# Patient Record
Sex: Female | Born: 1945
Health system: Southern US, Community
[De-identification: ages and names within clinical notes are randomized; demographics above are authoritative.]

## PROBLEM LIST (undated history)

## (undated) DIAGNOSIS — E785 Hyperlipidemia, unspecified: Secondary | ICD-10-CM

## (undated) DIAGNOSIS — I442 Atrioventricular block, complete: Secondary | ICD-10-CM

## (undated) DIAGNOSIS — Z9889 Other specified postprocedural states: Secondary | ICD-10-CM

## (undated) DIAGNOSIS — M1990 Unspecified osteoarthritis, unspecified site: Secondary | ICD-10-CM

## (undated) DIAGNOSIS — C50919 Malignant neoplasm of unspecified site of unspecified female breast: Secondary | ICD-10-CM

## (undated) DIAGNOSIS — Z973 Presence of spectacles and contact lenses: Secondary | ICD-10-CM

## (undated) DIAGNOSIS — F419 Anxiety disorder, unspecified: Secondary | ICD-10-CM

## (undated) DIAGNOSIS — T7840XA Allergy, unspecified, initial encounter: Secondary | ICD-10-CM

## (undated) DIAGNOSIS — C539 Malignant neoplasm of cervix uteri, unspecified: Secondary | ICD-10-CM

## (undated) DIAGNOSIS — R112 Nausea with vomiting, unspecified: Secondary | ICD-10-CM

## (undated) DIAGNOSIS — Z974 Presence of external hearing-aid: Secondary | ICD-10-CM

## (undated) DIAGNOSIS — M549 Dorsalgia, unspecified: Secondary | ICD-10-CM

## (undated) DIAGNOSIS — Z923 Personal history of irradiation: Secondary | ICD-10-CM

## (undated) HISTORY — PX: ABDOMINAL HYSTERECTOMY: SHX81

## (undated) HISTORY — DX: Hyperlipidemia, unspecified: E78.5

## (undated) HISTORY — DX: Allergy, unspecified, initial encounter: T78.40XA

## (undated) HISTORY — DX: Anxiety disorder, unspecified: F41.9

---

## 1980-04-27 HISTORY — PX: CHOLECYSTECTOMY: SHX55

## 1980-04-27 HISTORY — PX: APPENDECTOMY: SHX54

## 2003-04-28 DIAGNOSIS — C50919 Malignant neoplasm of unspecified site of unspecified female breast: Secondary | ICD-10-CM

## 2003-04-28 HISTORY — PX: CARDIAC PACEMAKER PLACEMENT: SHX583

## 2003-04-28 HISTORY — DX: Malignant neoplasm of unspecified site of unspecified female breast: C50.919

## 2003-04-28 HISTORY — PX: BREAST LUMPECTOMY: SHX2

## 2003-11-09 ENCOUNTER — Other Ambulatory Visit: Payer: Self-pay

## 2003-11-10 ENCOUNTER — Other Ambulatory Visit: Payer: Self-pay

## 2010-04-27 HISTORY — PX: HIP FRACTURE SURGERY: SHX118

## 2011-11-17 DIAGNOSIS — S72019A Unspecified intracapsular fracture of unspecified femur, initial encounter for closed fracture: Secondary | ICD-10-CM

## 2011-11-17 HISTORY — DX: Unspecified intracapsular fracture of unspecified femur, initial encounter for closed fracture: S72.019A

## 2013-06-05 DIAGNOSIS — Z950 Presence of cardiac pacemaker: Secondary | ICD-10-CM

## 2013-06-05 HISTORY — DX: Presence of cardiac pacemaker: Z95.0

## 2014-01-30 DIAGNOSIS — R002 Palpitations: Secondary | ICD-10-CM | POA: Insufficient documentation

## 2014-01-31 DIAGNOSIS — K219 Gastro-esophageal reflux disease without esophagitis: Secondary | ICD-10-CM | POA: Insufficient documentation

## 2014-05-29 ENCOUNTER — Ambulatory Visit: Payer: Self-pay | Admitting: Cardiology

## 2014-05-29 LAB — BASIC METABOLIC PANEL
Anion Gap: 6 — ABNORMAL LOW (ref 7–16)
BUN: 13 mg/dL (ref 7–18)
CALCIUM: 9.1 mg/dL (ref 8.5–10.1)
CHLORIDE: 106 mmol/L (ref 98–107)
Co2: 28 mmol/L (ref 21–32)
Creatinine: 0.96 mg/dL (ref 0.60–1.30)
EGFR (Non-African Amer.): >60
Glucose: 97 mg/dL (ref 65–99)
Osmolality: 279 (ref 275–301)
Potassium: 4.2 mmol/L (ref 3.5–5.1)
Sodium: 140 mmol/L (ref 136–145)

## 2014-05-29 LAB — CBC WITH DIFFERENTIAL/PLATELET
BASOS ABS: 0 10*3/uL (ref 0.0–0.1)
BASOS PCT: 0.6 %
Eosinophil #: 0.2 10*3/uL (ref 0.0–0.7)
Eosinophil %: 3.4 %
HCT: 41.5 % (ref 35.0–47.0)
HGB: 13.7 g/dL (ref 12.0–16.0)
Lymphocyte #: 1.9 10*3/uL (ref 1.0–3.6)
Lymphocyte %: 27.3 %
MCH: 30.7 pg (ref 26.0–34.0)
MCHC: 33 g/dL (ref 32.0–36.0)
MCV: 93 fL (ref 80–100)
MONOS PCT: 9.7 %
Monocyte #: 0.7 x10 3/mm (ref 0.2–0.9)
NEUTROS ABS: 4 10*3/uL (ref 1.4–6.5)
Neutrophil %: 59 %
Platelet: 284 10*3/uL (ref 150–440)
RBC: 4.46 10*6/uL (ref 3.80–5.20)
RDW: 13.2 % (ref 11.5–14.5)
WBC: 6.8 10*3/uL (ref 3.6–11.0)

## 2014-05-29 LAB — PROTIME-INR
INR: 0.8
Prothrombin Time: 11.7 secs

## 2014-05-29 LAB — APTT: Activated PTT: 24.4 secs (ref 23.6–35.9)

## 2014-06-05 ENCOUNTER — Ambulatory Visit: Payer: Self-pay | Admitting: Cardiology

## 2014-07-24 ENCOUNTER — Ambulatory Visit: Payer: Self-pay | Admitting: Family Medicine

## 2014-08-26 NOTE — Op Note (Signed)
PATIENT NAME:  Natalie Rosales, Natalie Rosales MR#:  [id number] DATE OF BIRTH:  March 11, 1946  DATE OF PROCEDURE:  06/05/2014  PRIMARY CARE PHYSICIAN: Dr. Lavone Neri.  PREPROCEDURE DIAGNOSIS: Complete heart block, elective replacement indication.   PROCEDURE: Dual chamber pacemaker generator change-out.   POSTPROCEDURE DIAGNOSIS: Atrial sensing with ventricular pacing.   INDICATION: The patient is a 69 year old female status post dual-chamber pacemaker for complete heart block. Recent pacemaker interrogation revealed elective replacement indication.   DESCRIPTION OF PROCEDURE: The risks, benefits and alternatives of dual-chamber pacemaker generator change-out were explained to the patient and informed written consent was obtained.   She was brought to the operating room in a fasting state. The left pectoral region was prepped and draped in the usual sterile manner. Anesthesia was obtained with 1% Xylocaine locally. A 6 cm incision was performed over the left pectoral region. The old pacemaker generator was retrieved by electrocautery and blunt dissection. The atrial and ventricular leads were disconnected from the old pacemaker generator and connected to a new dual-chamber rate responsive pacemaker generator (Medtronic Adapta [id number]). The pacemaker pocket was irrigated with gentamicin solution. The pacemaker generator was positioned into the pocket. The pocket was closed with 2-0 and 4-0 Vicryl, respectively. Steri-Strips and pressure dressing were applied.    ____________________________ Isaias Cowman, MD ap:JT D: 06/05/2014 13:03:00 ET T: 06/05/2014 13:41:51 ET JOB#: [id number]  cc: Isaias Cowman, MD, <Dictator> Isaias Cowman MD ELECTRONICALLY SIGNED 06/19/2014 14:36

## 2014-09-20 DIAGNOSIS — Z8781 Personal history of (healed) traumatic fracture: Secondary | ICD-10-CM | POA: Insufficient documentation

## 2014-09-20 DIAGNOSIS — F419 Anxiety disorder, unspecified: Secondary | ICD-10-CM | POA: Insufficient documentation

## 2014-09-20 DIAGNOSIS — J302 Other seasonal allergic rhinitis: Secondary | ICD-10-CM | POA: Insufficient documentation

## 2014-09-20 DIAGNOSIS — Z853 Personal history of malignant neoplasm of breast: Secondary | ICD-10-CM | POA: Insufficient documentation

## 2014-09-20 DIAGNOSIS — Z6823 Body mass index (BMI) 23.0-23.9, adult: Secondary | ICD-10-CM | POA: Insufficient documentation

## 2014-11-26 ENCOUNTER — Ambulatory Visit
Admission: RE | Admit: 2014-11-26 | Discharge: 2014-11-26 | Disposition: A | Discharge status: Home / Self Care | Payer: PPO | Source: Ambulatory Visit | Attending: Family Medicine | Admitting: Family Medicine

## 2014-11-26 ENCOUNTER — Ambulatory Visit (INDEPENDENT_AMBULATORY_CARE_PROVIDER_SITE_OTHER): Payer: PPO | Admitting: Family Medicine

## 2014-11-26 ENCOUNTER — Encounter: Payer: Self-pay | Admitting: Family Medicine

## 2014-11-26 ENCOUNTER — Telehealth: Payer: Self-pay

## 2014-11-26 VITALS — BP 116/72 | HR 68 | Temp 98.2°F | Resp 16 | Ht 63.0 in | Wt 129.0 lb

## 2014-11-26 DIAGNOSIS — M8580 Other specified disorders of bone density and structure, unspecified site: Secondary | ICD-10-CM

## 2014-11-26 DIAGNOSIS — F419 Anxiety disorder, unspecified: Secondary | ICD-10-CM

## 2014-11-26 DIAGNOSIS — E782 Mixed hyperlipidemia: Secondary | ICD-10-CM

## 2014-11-26 DIAGNOSIS — C50919 Malignant neoplasm of unspecified site of unspecified female breast: Secondary | ICD-10-CM | POA: Insufficient documentation

## 2014-11-26 DIAGNOSIS — M545 Low back pain, unspecified: Secondary | ICD-10-CM

## 2014-11-26 DIAGNOSIS — I442 Atrioventricular block, complete: Secondary | ICD-10-CM | POA: Insufficient documentation

## 2014-11-26 DIAGNOSIS — Z0000 Encounter for general adult medical examination without abnormal findings: Secondary | ICD-10-CM | POA: Diagnosis not present

## 2014-11-26 DIAGNOSIS — Z23 Encounter for immunization: Secondary | ICD-10-CM | POA: Diagnosis not present

## 2014-11-26 DIAGNOSIS — K219 Gastro-esophageal reflux disease without esophagitis: Secondary | ICD-10-CM

## 2014-11-26 DIAGNOSIS — Z950 Presence of cardiac pacemaker: Secondary | ICD-10-CM | POA: Insufficient documentation

## 2014-11-26 DIAGNOSIS — M810 Age-related osteoporosis without current pathological fracture: Secondary | ICD-10-CM | POA: Insufficient documentation

## 2014-11-26 DIAGNOSIS — M1990 Unspecified osteoarthritis, unspecified site: Secondary | ICD-10-CM | POA: Insufficient documentation

## 2014-11-26 MED ORDER — ALPRAZOLAM 0.25 MG PO TABS: 0.2500 mg | ORAL_TABLET | Freq: Every evening | ORAL | Status: DC | PRN

## 2014-11-26 NOTE — Telephone Encounter (Signed)
-----   Message from Margarita Rana, MD sent at 11/26/2014  1:50 PM EDT ----- Odette Horns shows arthritis, otherwise ok. Please notify patient.  Thanks.

## 2014-11-26 NOTE — Telephone Encounter (Signed)
LMTCB 11/26/14  Thanks,  -Nithila Sumners

## 2014-11-26 NOTE — Progress Notes (Signed)
Patient ID: Natalie Rosales, female   DOB: Jun 18, 1945, 69 y.o.   MRN: [id number]        Patient: Natalie Rosales, Female    DOB: May 19, 1945, 69 y.o.   MRN: [id number] Visit Date: 11/26/2014  Today's Provider: Margarita Rana, MD   Chief Complaint  Patient presents with  . Annual Exam   Subjective:    Annual wellness visit Natalie Rosales is a 69 y.o. female who presents today for her Subsequent Annual Wellness Visit. She feels fairly well. She reports exercising regularly. She reports she is sleeping fairly well, but does have some sleepless nights.   Back Pain This is a chronic problem. The current episode started more than 1 month ago. The problem occurs every several days. The problem has been gradually worsening since onset. The pain is present in the lumbar spine. The quality of the pain is described as aching. The pain does not radiate. Pertinent negatives include no abdominal pain, bladder incontinence, bowel incontinence, dysuria, fever, tingling or weakness.    -----------------------------------------------------------   Review of Systems  Constitutional: Negative.  Negative for fever.  HENT: Negative.   Eyes: Negative.   Respiratory: Negative.   Cardiovascular: Negative.   Gastrointestinal: Negative.  Negative for abdominal pain and bowel incontinence.  Endocrine: Negative.   Genitourinary: Negative.  Negative for bladder incontinence and dysuria.  Musculoskeletal: Positive for back pain and arthralgias.  Skin: Negative.   Allergic/Immunologic: Negative.   Neurological: Negative.  Negative for tingling and weakness.  Hematological: Negative.   Psychiatric/Behavioral: Negative.     History   Social History  . Marital Status: Married    Spouse Name: [name]  . Number of Children: 3  . Years of Education: College   Occupational History  . 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   Social History Main Topics  . Smoking status: Former Research scientist (life sciences)  . Smokeless tobacco:  Never Used  . Alcohol Use: Yes     Comment: Has a mixed drink about four times a week.  . Drug Use: No  . Sexual Activity: Not on file   Other Topics Concern  . Not on file   Social History Narrative    Patient Active Problem List   Diagnosis Date Noted  . Breast CA 11/26/2014  . Acquired complete AV block 11/26/2014  . Combined fat and carbohydrate induced hyperlipemia 11/26/2014  . Arthritis, degenerative 11/26/2014  . Osteoporosis, post-menopausal 11/26/2014  . Artificial cardiac pacemaker 11/26/2014  . Anxiety 09/20/2014  . Body mass index (BMI) of 23.0-23.9 in adult 09/20/2014  . H/O fracture of hip 09/20/2014  . H/O malignant neoplasm of breast 09/20/2014  . Allergic rhinitis, seasonal 09/20/2014  . Gastro-esophageal reflux disease without esophagitis 01/31/2014  . Awareness of heartbeats 01/30/2014  . Closed fracture of intracapsular section of femur 11/17/2011    Past Surgical History  Procedure Laterality Date  . Cardiac pacemaker placement  2005  . Appendectomy  1982  . Cholecystectomy  1982  . Abdominal hysterectomy    . Breast lumpectomy Left   . Hip fracture surgery Right     Three pins     Her family history includes Bladder Cancer in her brother; Breast cancer in her sister; Cancer in her father and mother; Heart disease in her sister; Lymphoma in her brother and father; Pancreatic cancer in her father.    Previous Medications   CELECOXIB (CELEBREX) 200 MG CAPSULE    Take by mouth.   OMEPRAZOLE (PRILOSEC)  20 MG CAPSULE    TAKE 1 CAPSULE (20 MG TOTAL) BY MOUTH ONCE DAILY.    Patient Care Team: Margarita Rana, MD as PCP - General (Family Medicine)     Objective:   Vitals: BP 116/72 mmHg  Pulse 68  Temp(Src) 98.2 F (36.8 C) (Oral)  Resp 16  Wt 129 lb (58.514 kg)  Physical Exam  Constitutional: She is oriented to person, place, and time. She appears well-developed and well-nourished.  HENT:  Head: Normocephalic and atraumatic.  Right Ear:  Hearing, tympanic membrane, external ear and ear canal normal.  Left Ear: Hearing, tympanic membrane, external ear and ear canal normal.  Nose: Nose normal.  Mouth/Throat: Uvula is midline, oropharynx is clear and moist and mucous membranes are normal.  Eyes: Conjunctivae and lids are normal. Pupils are equal, round, and reactive to light. Lids are everted and swept, no foreign bodies found.  Neck: Trachea normal, normal range of motion and full passive range of motion without pain. Neck supple. Carotid bruit is not present.  Cardiovascular: Normal rate, regular rhythm, normal heart sounds and normal pulses.   Pulmonary/Chest: Effort normal and breath sounds normal. Right breast exhibits no inverted nipple, no mass, no nipple discharge, no skin change and no tenderness. Left breast exhibits no inverted nipple, no mass, no nipple discharge, no skin change and no tenderness. Breasts are symmetrical.  Well healed scar noted. Pacemaker palpable with healing scar noted.    Abdominal: Normal appearance and bowel sounds are normal.  Musculoskeletal: Normal range of motion.  Neurological: She is alert and oriented to person, place, and time.  Skin: Skin is warm, dry and intact.  Psychiatric: She has a normal mood and affect. Judgment normal.    Activities of Daily Living In your present state of health, do you have any difficulty performing the following activities: 11/26/2014  Hearing? N  Vision? N  Difficulty concentrating or making decisions? N  Walking or climbing stairs? N  Dressing or bathing? N  Doing errands, shopping? N    Fall Risk Assessment Fall Risk  11/26/2014  Falls in the past year? No     Depression Screen PHQ 2/9 Scores 11/26/2014  PHQ - 2 Score 0    Cognitive Testing - 6-CIT  Correct? Score   What year is it? yes 0 0 or 4  What month is it? yes 0 0 or 3  Memorize:    Pia Mau,  42,  [address]  [place],      What time is it? (within 1 hour) yes 0 0 or 3  Count  backwards from 20 yes 0 0, 2, or 4  Name the months of the year yes 0 0, 2, or 4  Repeat name & address above no 3 0, 2, 4, 6, 8, or 10       TOTAL SCORE  3/28   Interpretation:  Normal  Normal (0-7) Abnormal (8-28)       Assessment & Plan:     Annual Wellness Visit  Reviewed patient's Family Medical History Reviewed and updated list of patient's medical providers Assessment of cognitive impairment was done Assessed patient's functional ability Established a written schedule for health screening [place] Completed and Reviewed  Exercise Activities and Dietary recommendations Goals    None      Immunization History  Administered Date(s) Administered  . Pneumococcal Conjugate-13 11/26/2014    Health Maintenance  Topic Date Due  . TETANUS/TDAP  05/13/1964  . MAMMOGRAM  05/14/1995  . COLONOSCOPY  05/14/1995  . ZOSTAVAX  05/13/2005  . DEXA SCAN  05/13/2010  . INFLUENZA VACCINE  11/26/2014  . PNA vac Low Risk Adult (2 of 2 - PPSV23) 11/26/2015      Discussed health benefits of physical activity, and encouraged her to engage in regular exercise appropriate for her age and condition.     2. Osteopenia Schedule bone density.  - DG Bone Density  3. Midline low back pain without sciatica Will check Xray.  - DG Lumbar Spine 2-3 Views  4. Need for vaccination with 13-polyvalent pneumococcal conjugate vaccine Given today.  - Pneumococcal conjugate vaccine 13-valent  5. Anxiety Stable. Check labs. Refill medication.  - TSH - ALPRAZolam (XANAX) 0.25 MG tablet; Take 1 tablet (0.25 mg total) by mouth at bedtime as needed for anxiety.  Dispense: 30 tablet; Refill: 5  6. Gastro-esophageal reflux disease without esophagitis  - CBC with Differential/Platelet  7. Combined fat and carbohydrate induced hyperlipemia - Comprehensive metabolic panel - Lipid  panel  ------------------------------------------------------------------------------------------------------------ Patient was seen and examined by Jerrell Belfast, MD, and note scribed by Ashley Royalty, CMA.   I have reviewed the document for accuracy and completeness and I agree with above. Jerrell Belfast, MD   Margarita Rana, MD

## 2014-11-27 LAB — CBC WITH DIFFERENTIAL/PLATELET
BASOS ABS: 0 10*3/uL (ref 0.0–0.2)
BASOS: 0 %
EOS (ABSOLUTE): 0.3 10*3/uL (ref 0.0–0.4)
Eos: 4 %
HEMOGLOBIN: 13.4 g/dL (ref 11.1–15.9)
Hematocrit: 39.4 % (ref 34.0–46.6)
IMMATURE GRANS (ABS): 0 10*3/uL (ref 0.0–0.1)
Immature Granulocytes: 0 %
LYMPHS ABS: 2.1 10*3/uL (ref 0.7–3.1)
LYMPHS: 31 %
MCH: 31.2 pg (ref 26.6–33.0)
MCHC: 34 g/dL (ref 31.5–35.7)
MCV: 92 fL (ref 79–97)
MONOCYTES: 11 %
MONOS ABS: 0.8 10*3/uL (ref 0.1–0.9)
Neutrophils Absolute: 3.5 10*3/uL (ref 1.4–7.0)
Neutrophils: 54 %
PLATELETS: 269 10*3/uL (ref 150–379)
RBC: 4.3 x10E6/uL (ref 3.77–5.28)
RDW: 13.8 % (ref 12.3–15.4)
WBC: 6.7 10*3/uL (ref 3.4–10.8)

## 2014-11-27 LAB — COMPREHENSIVE METABOLIC PANEL
ALT: 13 IU/L (ref 0–32)
AST: 20 IU/L (ref 0–40)
Albumin/Globulin Ratio: 2 (ref 1.1–2.5)
Albumin: 4.5 g/dL (ref 3.6–4.8)
Alkaline Phosphatase: 68 IU/L (ref 39–117)
BUN/Creatinine Ratio: 16 (ref 11–26)
BUN: 12 mg/dL (ref 8–27)
Bilirubin Total: 0.5 mg/dL (ref 0.0–1.2)
CO2: 26 mmol/L (ref 18–29)
Calcium: 9.8 mg/dL (ref 8.7–10.3)
Chloride: 101 mmol/L (ref 97–108)
Creatinine, Ser: 0.73 mg/dL (ref 0.57–1.00)
GFR calc Af Amer: 97 mL/min/{1.73_m2} (ref >59)
GFR, EST NON AFRICAN AMERICAN: 84 mL/min/{1.73_m2} (ref >59)
GLOBULIN, TOTAL: 2.3 g/dL (ref 1.5–4.5)
GLUCOSE: 102 mg/dL — AB (ref 65–99)
Potassium: 4.7 mmol/L (ref 3.5–5.2)
Sodium: 141 mmol/L (ref 134–144)
Total Protein: 6.8 g/dL (ref 6.0–8.5)

## 2014-11-27 LAB — TSH: TSH: 1.85 u[IU]/mL (ref 0.450–4.500)

## 2014-11-27 LAB — LIPID PANEL
Chol/HDL Ratio: 3.3 ratio units (ref 0.0–4.4)
Cholesterol, Total: 246 mg/dL — ABNORMAL HIGH (ref 100–199)
HDL: 74 mg/dL (ref >39)
LDL CALC: 146 mg/dL — AB (ref 0–99)
TRIGLYCERIDES: 132 mg/dL (ref 0–149)
VLDL CHOLESTEROL CAL: 26 mg/dL (ref 5–40)

## 2014-11-27 NOTE — Telephone Encounter (Signed)
Advised pt of the above results. Pt verbalized fully understanding.

## 2014-11-28 ENCOUNTER — Telehealth: Payer: Self-pay

## 2014-11-28 NOTE — Telephone Encounter (Signed)
Advised pt of the above results. Pt verbalized fully understanding and stated that she will try diet and exercise first, and will call for an appointment in 6 - 12 months.

## 2014-11-28 NOTE — Telephone Encounter (Signed)
Left message to call back  

## 2014-11-28 NOTE — Telephone Encounter (Signed)
-----   Message from Margarita Rana, MD sent at 11/28/2014  6:43 AM EDT ----- Labs stable. Blood sugar is very mildly elevated.  Also, cholesterol is elevated at  246. 10 year risk of heart disease is 7.1 percent.  Medication is recommended. Can start medication, or work on diet and exercise and recheck both sugar and cholesterol  in 6 to 12 months.  Thanks.

## 2014-12-06 ENCOUNTER — Ambulatory Visit: Payer: PPO

## 2014-12-10 ENCOUNTER — Encounter: Payer: Self-pay | Admitting: Family Medicine

## 2014-12-12 ENCOUNTER — Ambulatory Visit
Admission: RE | Admit: 2014-12-12 | Discharge: 2014-12-12 | Disposition: A | Discharge status: Home / Self Care | Payer: PPO | Source: Ambulatory Visit | Attending: Family Medicine | Admitting: Family Medicine

## 2014-12-12 DIAGNOSIS — M8580 Other specified disorders of bone density and structure, unspecified site: Secondary | ICD-10-CM | POA: Diagnosis present

## 2014-12-13 ENCOUNTER — Telehealth: Payer: Self-pay

## 2014-12-13 NOTE — Telephone Encounter (Signed)
-----   Message from Margarita Rana, MD sent at 12/13/2014  1:33 PM EDT ----- Bone thinning. Not osteoporosis.  Make sure to eat healthy, perform weight bearing exercise and recheck  In 2 years. Thanks.

## 2014-12-13 NOTE — Telephone Encounter (Signed)
Advised pt as directed below, pt verbalized fully understanding.   Thanks,   

## 2015-04-30 DIAGNOSIS — M5440 Lumbago with sciatica, unspecified side: Secondary | ICD-10-CM | POA: Diagnosis not present

## 2015-05-08 DIAGNOSIS — M5440 Lumbago with sciatica, unspecified side: Secondary | ICD-10-CM | POA: Diagnosis not present

## 2015-05-14 DIAGNOSIS — M5440 Lumbago with sciatica, unspecified side: Secondary | ICD-10-CM | POA: Diagnosis not present

## 2015-05-21 ENCOUNTER — Other Ambulatory Visit: Payer: Self-pay

## 2015-05-21 DIAGNOSIS — M5440 Lumbago with sciatica, unspecified side: Secondary | ICD-10-CM | POA: Diagnosis not present

## 2015-05-28 ENCOUNTER — Encounter: Payer: Self-pay | Admitting: Family Medicine

## 2015-05-28 ENCOUNTER — Ambulatory Visit (INDEPENDENT_AMBULATORY_CARE_PROVIDER_SITE_OTHER): Payer: PPO | Admitting: Family Medicine

## 2015-05-28 DIAGNOSIS — Z1211 Encounter for screening for malignant neoplasm of colon: Secondary | ICD-10-CM | POA: Diagnosis not present

## 2015-05-28 DIAGNOSIS — M545 Low back pain, unspecified: Secondary | ICD-10-CM | POA: Insufficient documentation

## 2015-05-28 NOTE — Progress Notes (Signed)
Patient ID: Natalie Rosales, female   DOB: 1946/01/07, 70 y.o.   MRN: [id number]         Patient: Natalie Rosales Female    DOB: 1946-02-04   70 y.o.   MRN: [id number] Visit Date: 05/28/2015  Today's Provider: Margarita Rana, MD   Chief Complaint  Patient presents with  . Back Pain  . Hip Pain    Right side   Subjective:    Back Pain This is a chronic problem. The current episode started more than 1 year ago. The problem has been gradually improving since onset. The pain is present in the lumbar spine. The pain does not radiate. The pain is at a severity of 8/10. Associated symptoms include numbness (Pt reports her feet are numb. ). Pertinent negatives include no abdominal pain, bladder incontinence, bowel incontinence, chest pain, dysuria, fever, headaches, leg pain, paresis, paresthesias, pelvic pain, perianal numbness, tingling, weakness or weight loss. She has tried home exercises (Pt has been going to physical therapy for three months, and celebrex) for the symptoms. The treatment provided moderate relief.  Hip Pain  Injury mechanism: Pt has a history of a right hip fracture 2012.   The pain is at a severity of 7/10. The pain has been constant since onset. Associated symptoms include numbness (Pt reports her feet are numb. ). Pertinent negatives include no inability to bear weight, loss of motion, loss of sensation, muscle weakness or tingling.       No Known Allergies Previous Medications   ALPRAZOLAM (XANAX) 0.25 MG TABLET    Take 1 tablet (0.25 mg total) by mouth at bedtime as needed for anxiety.   ASPIRIN EC 81 MG TABLET    Take by mouth.   CELECOXIB (CELEBREX) 200 MG CAPSULE    Take by mouth.   OMEPRAZOLE (PRILOSEC) 20 MG CAPSULE    TAKE 1 CAPSULE (20 MG TOTAL) BY MOUTH ONCE DAILY.    Review of Systems  Constitutional: Negative.  Negative for fever and weight loss.  Respiratory: Negative.   Cardiovascular: Negative.  Negative for chest pain.  Gastrointestinal: Negative.   Negative for abdominal pain and bowel incontinence.  Genitourinary: Negative for bladder incontinence, dysuria and pelvic pain.  Musculoskeletal: Positive for back pain and arthralgias. Negative for myalgias, joint swelling, gait problem, neck pain and neck stiffness.  Neurological: Positive for numbness (Pt reports her feet are numb. ). Negative for dizziness, tingling, weakness, light-headedness, headaches and paresthesias.    Social History  Substance Use Topics  . Smoking status: Former Research scientist (life sciences)  . Smokeless tobacco: Never Used  . Alcohol Use: Yes     Comment: Has a mixed drink about four times a week.   Objective:   BP 112/74 mmHg  Pulse 68  Temp(Src) 98.7 F (37.1 C) (Oral)  Resp 16  Wt 129 lb (58.514 kg)  Physical Exam  Constitutional: She is oriented to person, place, and time. She appears well-developed and well-nourished.  Musculoskeletal:       Lumbar back: She exhibits tenderness and bony tenderness.  Tender to touch at [facility] Joint bilateral.   Neurological: She is alert and oriented to person, place, and time.  Psychiatric: She has a normal mood and affect. Her behavior is normal. Judgment and thought content normal.      Assessment & Plan:     1. Bilateral low back pain, with sciatica presence unspecified Tender over SI joint, but also with some foot numbness. Continue Celebrex and refer to Dr. Sharlet Salina. Further  plan pending these results.   - Ambulatory referral to Orthopedic Surgery  2. Colon cancer screening Will schedule.  - Ambulatory referral to Gastroenterology     Margarita Rana, MD  [facility]

## 2015-05-31 ENCOUNTER — Telehealth: Payer: Self-pay

## 2015-05-31 ENCOUNTER — Other Ambulatory Visit: Payer: Self-pay

## 2015-05-31 NOTE — Telephone Encounter (Signed)
Gastroenterology Pre-Procedure Review  Request Date:  Requesting Physician: Dr. Venia Minks  PATIENT REVIEW QUESTIONS: The patient responded to the following health history questions as indicated:    1. Are you having any GI issues? no 2. Do you have a personal history of Polyps? no 3. Do you have a family history of Colon Cancer or Polyps? no 4. Diabetes Mellitus? no 5. Joint replacements in the past 12 months?yes (2012 broken hip) 6. Major health problems in the past 3 months?no 7. Any artificial heart valves, MVP, or defibrillator?yes (pacemaker 05/2014)    MEDICATIONS & ALLERGIES:    Patient reports the following regarding taking any anticoagulation/antiplatelet therapy:   Plavix, Coumadin, Eliquis, Xarelto, Lovenox, Pradaxa, Brilinta, or Effient? no Aspirin? no  Patient confirms/reports the following medications:  Current Outpatient Prescriptions  Medication Sig Dispense Refill  . ALPRAZolam (XANAX) 0.25 MG tablet Take 1 tablet (0.25 mg total) by mouth at bedtime as needed for anxiety. 30 tablet 5  . aspirin EC 81 MG tablet Take by mouth.    . celecoxib (CELEBREX) 200 MG capsule Take by mouth.    Marland Kitchen omeprazole (PRILOSEC) 20 MG capsule TAKE 1 CAPSULE (20 MG TOTAL) BY MOUTH ONCE DAILY.     No current facility-administered medications for this visit.    Patient confirms/reports the following allergies:  No Known Allergies  No orders of the defined types were placed in this encounter.    AUTHORIZATION INFORMATION Primary Insurance: 1D#: Group #:  Secondary Insurance: 1D#: Group #:  SCHEDULE INFORMATION: Date: 06/10/15 Time: Location: [place]

## 2015-06-04 ENCOUNTER — Encounter: Payer: Self-pay | Admitting: *Deleted

## 2015-06-04 DIAGNOSIS — M5440 Lumbago with sciatica, unspecified side: Secondary | ICD-10-CM | POA: Diagnosis not present

## 2015-06-07 NOTE — Discharge Instructions (Signed)

## 2015-06-10 ENCOUNTER — Ambulatory Visit: Payer: PPO | Admitting: Student in an Organized Health Care Education/Training Program

## 2015-06-10 ENCOUNTER — Encounter
Admission: RE | Disposition: A | Discharge status: Home / Self Care | Payer: Self-pay | Source: Ambulatory Visit | Attending: Gastroenterology

## 2015-06-10 ENCOUNTER — Ambulatory Visit
Admission: RE | Admit: 2015-06-10 | Discharge: 2015-06-10 | Disposition: A | Discharge status: Home / Self Care | Payer: PPO | Source: Ambulatory Visit | Attending: Gastroenterology | Admitting: Gastroenterology

## 2015-06-10 ENCOUNTER — Encounter: Payer: Self-pay | Admitting: *Deleted

## 2015-06-10 DIAGNOSIS — K641 Second degree hemorrhoids: Secondary | ICD-10-CM | POA: Insufficient documentation

## 2015-06-10 DIAGNOSIS — Z800 Family history of malignant neoplasm of digestive organs: Secondary | ICD-10-CM | POA: Insufficient documentation

## 2015-06-10 DIAGNOSIS — Z9049 Acquired absence of other specified parts of digestive tract: Secondary | ICD-10-CM | POA: Diagnosis not present

## 2015-06-10 DIAGNOSIS — Z79899 Other long term (current) drug therapy: Secondary | ICD-10-CM | POA: Diagnosis not present

## 2015-06-10 DIAGNOSIS — Z809 Family history of malignant neoplasm, unspecified: Secondary | ICD-10-CM | POA: Diagnosis not present

## 2015-06-10 DIAGNOSIS — F419 Anxiety disorder, unspecified: Secondary | ICD-10-CM | POA: Diagnosis not present

## 2015-06-10 DIAGNOSIS — Z87891 Personal history of nicotine dependence: Secondary | ICD-10-CM | POA: Diagnosis not present

## 2015-06-10 DIAGNOSIS — K219 Gastro-esophageal reflux disease without esophagitis: Secondary | ICD-10-CM | POA: Insufficient documentation

## 2015-06-10 DIAGNOSIS — M545 Low back pain: Secondary | ICD-10-CM | POA: Insufficient documentation

## 2015-06-10 DIAGNOSIS — Z90710 Acquired absence of both cervix and uterus: Secondary | ICD-10-CM | POA: Insufficient documentation

## 2015-06-10 DIAGNOSIS — Z853 Personal history of malignant neoplasm of breast: Secondary | ICD-10-CM | POA: Diagnosis not present

## 2015-06-10 DIAGNOSIS — H9193 Unspecified hearing loss, bilateral: Secondary | ICD-10-CM | POA: Diagnosis not present

## 2015-06-10 DIAGNOSIS — Z803 Family history of malignant neoplasm of breast: Secondary | ICD-10-CM | POA: Diagnosis not present

## 2015-06-10 DIAGNOSIS — I442 Atrioventricular block, complete: Secondary | ICD-10-CM | POA: Insufficient documentation

## 2015-06-10 DIAGNOSIS — Z1210 Encounter for screening for malignant neoplasm of intestinal tract, unspecified: Secondary | ICD-10-CM | POA: Diagnosis not present

## 2015-06-10 DIAGNOSIS — M479 Spondylosis, unspecified: Secondary | ICD-10-CM | POA: Diagnosis not present

## 2015-06-10 DIAGNOSIS — Z807 Family history of other malignant neoplasms of lymphoid, hematopoietic and related tissues: Secondary | ICD-10-CM | POA: Diagnosis not present

## 2015-06-10 DIAGNOSIS — Z950 Presence of cardiac pacemaker: Secondary | ICD-10-CM | POA: Insufficient documentation

## 2015-06-10 DIAGNOSIS — Z8052 Family history of malignant neoplasm of bladder: Secondary | ICD-10-CM | POA: Diagnosis not present

## 2015-06-10 DIAGNOSIS — Z1211 Encounter for screening for malignant neoplasm of colon: Secondary | ICD-10-CM | POA: Diagnosis not present

## 2015-06-10 DIAGNOSIS — Z7982 Long term (current) use of aspirin: Secondary | ICD-10-CM | POA: Insufficient documentation

## 2015-06-10 HISTORY — DX: Malignant neoplasm of unspecified site of unspecified female breast: C50.919

## 2015-06-10 HISTORY — PX: COLONOSCOPY WITH PROPOFOL: SHX5780

## 2015-06-10 HISTORY — DX: Dorsalgia, unspecified: M54.9

## 2015-06-10 HISTORY — DX: Atrioventricular block, complete: I44.2

## 2015-06-10 HISTORY — DX: Nausea with vomiting, unspecified: R11.2

## 2015-06-10 HISTORY — DX: Presence of external hearing-aid: Z97.4

## 2015-06-10 HISTORY — DX: Presence of spectacles and contact lenses: Z97.3

## 2015-06-10 HISTORY — DX: Other specified postprocedural states: Z98.890

## 2015-06-10 HISTORY — DX: Unspecified osteoarthritis, unspecified site: M19.90

## 2015-06-10 SURGERY — COLONOSCOPY WITH PROPOFOL
Anesthesia: Monitor Anesthesia Care | Wound class: Contaminated

## 2015-06-10 MED ORDER — LACTATED RINGERS IV SOLN
INTRAVENOUS | Status: DC
  Administered 2015-06-10: 09:00:00 via INTRAVENOUS

## 2015-06-10 MED ORDER — STERILE WATER FOR IRRIGATION IR SOLN
Status: DC | PRN
  Administered 2015-06-10: 11:00:00

## 2015-06-10 MED ORDER — PROPOFOL 10 MG/ML IV BOLUS
INTRAVENOUS | Status: DC | PRN
  Administered 2015-06-10: 70 mg via INTRAVENOUS
  Administered 2015-06-10: 30 mg via INTRAVENOUS
  Administered 2015-06-10 (×2): 20 mg via INTRAVENOUS

## 2015-06-10 MED ORDER — LIDOCAINE HCL (CARDIAC) 20 MG/ML IV SOLN
INTRAVENOUS | Status: DC | PRN
  Administered 2015-06-10: 50 mg via INTRAVENOUS

## 2015-06-10 MED ORDER — LACTATED RINGERS IV SOLN: INTRAVENOUS | Status: DC

## 2015-06-10 SURGICAL SUPPLY — 28 items

## 2015-06-10 NOTE — Transfer of Care (Signed)
Immediate Anesthesia Transfer of Care Note  Patient: Natalie Rosales  Procedure(s) Performed: Procedure(s): COLONOSCOPY WITH PROPOFOL (N/A)  Patient Location: PACU  Anesthesia Type: MAC  Level of Consciousness: awake, alert  and patient cooperative  Airway and Oxygen Therapy: Patient Spontanous Breathing and Patient connected to supplemental oxygen  Post-op Assessment: Post-op Vital signs reviewed, Patient's Cardiovascular Status Stable, Respiratory Function Stable, Patent Airway and No signs of Nausea or vomiting  Post-op Vital Signs: Reviewed and stable  Complications: No apparent anesthesia complications

## 2015-06-10 NOTE — Anesthesia Preprocedure Evaluation (Signed)
Anesthesia Evaluation  Patient identified by MRN, date of birth, ID band Patient awake    Reviewed: Allergy & Precautions, H&P , NPO status , Patient's Chart, lab work & pertinent test results, reviewed documented beta blocker date and time   History of Anesthesia Complications (+) PONV and history of anesthetic complications  Airway Mallampati: II  TM Distance: >3 FB Neck ROM: full    Dental no notable dental hx.    Pulmonary neg pulmonary ROS, former smoker,    Pulmonary exam normal breath sounds clear to auscultation       Cardiovascular Exercise Tolerance: Good Normal cardiovascular exam Rhythm:regular Rate:Normal  Complete heart block with implanted pacemaker in 2005   Neuro/Psych negative neurological ROS  negative psych ROS   GI/Hepatic Neg liver ROS, GERD  ,  Endo/Other  negative endocrine ROS  Renal/GU negative Renal ROS  negative genitourinary   Musculoskeletal   Abdominal   Peds  Hematology negative hematology ROS (+)   Anesthesia Other Findings   Reproductive/Obstetrics negative OB ROS                             Anesthesia Physical Anesthesia Plan  ASA: II  Anesthesia Plan: MAC   Post-op Pain Management:    Induction: Intravenous  Airway Management Planned: Nasal Cannula  Additional Equipment:   Intra-op Plan:   Post-operative Plan:   Informed Consent: I have reviewed the patients History and Physical, chart, labs and discussed the procedure including the risks, benefits and alternatives for the proposed anesthesia with the patient or authorized representative who has indicated his/her understanding and acceptance.   Dental Advisory Given  Plan Discussed with: CRNA and Anesthesiologist  Anesthesia Plan Comments:         Anesthesia Quick Evaluation

## 2015-06-10 NOTE — Op Note (Signed)
[facility] Gastroenterology Patient Name: Natalie Rosales Procedure Date: 06/10/2015 10:29 AM MRN: [id number] Account #: [id number] Date of Birth: Nov 12, 1945 Admit Type: Outpatient Age: 70 Room: [facility] OR ROOM 01 Gender: Female Note Status: Finalized Procedure:         Colonoscopy Indications:       Screening for colorectal malignant neoplasm Providers:         Lucilla Lame, MD Referring MD:      Jerrell Belfast, MD (Referring MD) Medicines:         Propofol per Anesthesia Complications:     No immediate complications. Procedure:         Pre-Anesthesia Assessment:                    - Prior to the procedure, a History and Physical was                     performed, and patient medications and allergies were                     reviewed. The patient's tolerance of previous anesthesia                     was also reviewed. The risks and benefits of the procedure                     and the sedation options and risks were discussed with the                     patient. All questions were answered, and informed consent                     was obtained. Prior Anticoagulants: The patient has taken                     no previous anticoagulant or antiplatelet agents. ASA                     Grade Assessment: II - A patient with mild systemic                     disease. After reviewing the risks and benefits, the                     patient was deemed in satisfactory condition to undergo                     the procedure.                    After obtaining informed consent, the colonoscope was                     passed under direct vision. Throughout the procedure, the                     patient's blood pressure, pulse, and oxygen saturations                     were monitored continuously. The was introduced through                     the anus and advanced to the the cecum, identified by  appendiceal orifice and ileocecal valve. The colonoscopy                 was performed without difficulty. The patient tolerated                     the procedure well. The quality of the bowel preparation                     was excellent. Findings:      The perianal and digital rectal examinations were normal.      Non-bleeding internal hemorrhoids were found during retroflexion. The       hemorrhoids were Grade II (internal hemorrhoids that prolapse but reduce       spontaneously). Impression:        - Non-bleeding internal hemorrhoids.                    - No specimens collected. Recommendation:    - Repeat colonoscopy in 10 years for screening unless any                     change in family history or lower GI problems. Procedure Code(s): --- Professional ---                    [phone], Colonoscopy, flexible; diagnostic, including                     collection of specimen(s) by brushing or washing, when                     performed (separate procedure) Diagnosis Code(s): --- Professional ---                    Z12.11, Encounter for screening for malignant neoplasm of                     colon CPT copyright 2016 American Medical Association. All rights reserved. The codes documented in this report are preliminary and upon coder review may  be revised to meet current compliance requirements. Lucilla Lame, MD 06/10/2015 10:52:58 AM This report has been signed electronically. Number of Addenda: 0 Note Initiated On: 06/10/2015 10:29 AM Scope Withdrawal Time: 0 hours 6 minutes 23 seconds  Total Procedure Duration: 0 hours 10 minutes 58 seconds       [facility]

## 2015-06-10 NOTE — Anesthesia Procedure Notes (Signed)
Procedure Name: MAC Performed by: Saara Kijowski Pre-anesthesia Checklist: Patient identified, Emergency Drugs available, Suction available, Timeout performed and Patient being monitored Patient Re-evaluated:Patient Re-evaluated prior to inductionOxygen Delivery Method: Nasal cannula Placement Confirmation: positive ETCO2       

## 2015-06-10 NOTE — Anesthesia Postprocedure Evaluation (Signed)
Anesthesia Post Note  Patient: Natalie Rosales  Procedure(s) Performed: Procedure(s) (LRB): COLONOSCOPY WITH PROPOFOL (N/A)  Patient location during evaluation: PACU Anesthesia Type: MAC Level of consciousness: awake and alert Pain management: pain level controlled Vital Signs Assessment: post-procedure vital signs reviewed and stable Respiratory status: spontaneous breathing, nonlabored ventilation and respiratory function stable Cardiovascular status: blood pressure returned to baseline and stable Postop Assessment: no signs of nausea or vomiting Anesthetic complications: no    Trecia Rogers

## 2015-06-10 NOTE — H&P (Signed)
  [facility]  [address], [place], [place] [address] Phone: [phone] Fax : [phone]  Primary Care Physician:  Margarita Rana, MD Primary Gastroenterologist:  Dr. Allen Norris  Pre-Procedure History & Physical: HPI:  Natalie Rosales is a 70 y.o. female is here for a screening colonoscopy.   Past Medical History  Diagnosis Date  . Allergy   . Anxiety   . PONV (postoperative nausea and vomiting)   . Complete heart block ([place])     Pacemaker placed 2005  . Arthritis     back  . Breast cancer ([place])   . Back pain     lower back  . Wears contact lenses   . Wears hearing aid     bilateral    Past Surgical History  Procedure Laterality Date  . Cardiac pacemaker placement  2005  . Appendectomy  1982  . Cholecystectomy  1982  . Abdominal hysterectomy    . Breast lumpectomy Left   . Hip fracture surgery Right 2012    Three pins     Prior to Admission medications   Medication Sig Start Date End Date Taking? Authorizing Provider  ALPRAZolam (XANAX) 0.25 MG tablet Take 1 tablet (0.25 mg total) by mouth at bedtime as needed for anxiety. 11/26/14  Yes Margarita Rana, MD  aspirin EC 81 MG tablet Take by mouth.   Yes Historical Provider, MD  celecoxib (CELEBREX) 200 MG capsule Take by mouth.    Historical Provider, MD    Allergies as of 05/31/2015  . (No Known Allergies)    Family History  Problem Relation Age of Onset  . Cancer Mother   . Cancer Father     Lymphoma or Pancreatic Cancer  . Pancreatic cancer Father   . Lymphoma Father   . Breast cancer Sister   . Heart disease Sister   . Bladder Cancer Brother   . Lymphoma Brother     Social History   Social History  . Marital Status: Married    Spouse Name: [name]  . Number of Children: 3  . Years of Education: College   Occupational History  . 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   Social History Main Topics  . Smoking status: Former Research scientist (life sciences)  . Smokeless tobacco: Never Used     Comment: quit  2005  . Alcohol Use: Yes     Comment: Has a mixed drink about four times a week.  . Drug Use: No  . Sexual Activity: Not on file   Other Topics Concern  . Not on file   Social History Narrative    Review of Systems: See HPI, otherwise negative ROS  Physical Exam: BP 122/78 mmHg  Pulse 73  Temp(Src) 98.2 F (36.8 C)  Resp 16  Ht 5\' 3"  (1.6 m)  Wt 127 lb (57.607 kg)  BMI 22.50 kg/m2  SpO2 100% General:   Alert,  pleasant and cooperative in NAD Head:  Normocephalic and atraumatic. Neck:  Supple; no masses or thyromegaly. Lungs:  Clear throughout to auscultation.    Heart:  Regular rate and rhythm. Abdomen:  Soft, nontender and nondistended. Normal bowel sounds, without guarding, and without rebound.   Neurologic:  Alert and  oriented x4;  grossly normal neurologically.  Impression/Plan: Natalie Rosales is now here to undergo a screening colonoscopy.  Risks, benefits, and alternatives regarding colonoscopy have been reviewed with the patient.  Questions have been answered.  All parties agreeable.

## 2015-06-11 ENCOUNTER — Encounter: Payer: Self-pay | Admitting: Gastroenterology

## 2015-06-25 DIAGNOSIS — B001 Herpesviral vesicular dermatitis: Secondary | ICD-10-CM | POA: Diagnosis not present

## 2015-06-25 DIAGNOSIS — L821 Other seborrheic keratosis: Secondary | ICD-10-CM | POA: Diagnosis not present

## 2015-06-25 DIAGNOSIS — X32XXXA Exposure to sunlight, initial encounter: Secondary | ICD-10-CM | POA: Diagnosis not present

## 2015-06-25 DIAGNOSIS — L570 Actinic keratosis: Secondary | ICD-10-CM | POA: Diagnosis not present

## 2015-07-01 DIAGNOSIS — M4726 Other spondylosis with radiculopathy, lumbar region: Secondary | ICD-10-CM | POA: Diagnosis not present

## 2015-07-01 DIAGNOSIS — M5416 Radiculopathy, lumbar region: Secondary | ICD-10-CM | POA: Diagnosis not present

## 2015-07-01 DIAGNOSIS — M5136 Other intervertebral disc degeneration, lumbar region: Secondary | ICD-10-CM | POA: Diagnosis not present

## 2015-07-02 ENCOUNTER — Other Ambulatory Visit: Payer: Self-pay | Admitting: Physical Medicine and Rehabilitation

## 2015-07-02 DIAGNOSIS — M5416 Radiculopathy, lumbar region: Secondary | ICD-10-CM

## 2015-07-09 ENCOUNTER — Ambulatory Visit
Admission: RE | Admit: 2015-07-09 | Discharge: 2015-07-09 | Disposition: A | Discharge status: Home / Self Care | Payer: PPO | Source: Ambulatory Visit | Attending: Physical Medicine and Rehabilitation | Admitting: Physical Medicine and Rehabilitation

## 2015-07-09 DIAGNOSIS — I442 Atrioventricular block, complete: Secondary | ICD-10-CM | POA: Diagnosis not present

## 2015-07-09 DIAGNOSIS — R002 Palpitations: Secondary | ICD-10-CM | POA: Diagnosis not present

## 2015-07-09 DIAGNOSIS — E782 Mixed hyperlipidemia: Secondary | ICD-10-CM | POA: Diagnosis not present

## 2015-07-09 DIAGNOSIS — M5416 Radiculopathy, lumbar region: Secondary | ICD-10-CM | POA: Insufficient documentation

## 2015-07-09 DIAGNOSIS — M4316 Spondylolisthesis, lumbar region: Secondary | ICD-10-CM | POA: Diagnosis not present

## 2015-07-29 DIAGNOSIS — M4726 Other spondylosis with radiculopathy, lumbar region: Secondary | ICD-10-CM | POA: Diagnosis not present

## 2015-07-29 DIAGNOSIS — M5416 Radiculopathy, lumbar region: Secondary | ICD-10-CM | POA: Diagnosis not present

## 2015-07-29 DIAGNOSIS — M5136 Other intervertebral disc degeneration, lumbar region: Secondary | ICD-10-CM | POA: Diagnosis not present

## 2015-08-08 ENCOUNTER — Other Ambulatory Visit: Payer: Self-pay | Admitting: Family Medicine

## 2015-08-08 DIAGNOSIS — F419 Anxiety disorder, unspecified: Secondary | ICD-10-CM

## 2015-08-09 NOTE — Telephone Encounter (Signed)
Printed, please fax or call in to pharmacy. Thank you.   

## 2015-09-06 DIAGNOSIS — H2513 Age-related nuclear cataract, bilateral: Secondary | ICD-10-CM | POA: Diagnosis not present

## 2015-12-09 DIAGNOSIS — M5416 Radiculopathy, lumbar region: Secondary | ICD-10-CM | POA: Diagnosis not present

## 2015-12-09 DIAGNOSIS — M4726 Other spondylosis with radiculopathy, lumbar region: Secondary | ICD-10-CM | POA: Diagnosis not present

## 2015-12-09 DIAGNOSIS — M5136 Other intervertebral disc degeneration, lumbar region: Secondary | ICD-10-CM | POA: Diagnosis not present

## 2016-01-13 ENCOUNTER — Encounter: Payer: Self-pay | Admitting: Family Medicine

## 2016-01-13 ENCOUNTER — Ambulatory Visit (INDEPENDENT_AMBULATORY_CARE_PROVIDER_SITE_OTHER): Payer: PPO | Admitting: Family Medicine

## 2016-01-13 VITALS — BP 114/72 | HR 64 | Temp 98.0°F | Resp 16 | Wt 129.0 lb

## 2016-01-13 DIAGNOSIS — Z0000 Encounter for general adult medical examination without abnormal findings: Secondary | ICD-10-CM | POA: Diagnosis not present

## 2016-01-13 DIAGNOSIS — Z853 Personal history of malignant neoplasm of breast: Secondary | ICD-10-CM

## 2016-01-13 DIAGNOSIS — E782 Mixed hyperlipidemia: Secondary | ICD-10-CM | POA: Diagnosis not present

## 2016-01-13 DIAGNOSIS — J302 Other seasonal allergic rhinitis: Secondary | ICD-10-CM

## 2016-01-13 DIAGNOSIS — Z23 Encounter for immunization: Secondary | ICD-10-CM | POA: Diagnosis not present

## 2016-01-13 DIAGNOSIS — Z950 Presence of cardiac pacemaker: Secondary | ICD-10-CM

## 2016-01-13 DIAGNOSIS — M545 Low back pain: Secondary | ICD-10-CM

## 2016-01-13 MED ORDER — CELECOXIB 200 MG PO CAPS: 200.0000 mg | ORAL_CAPSULE | Freq: Every day | ORAL | 1 refills | Status: DC

## 2016-01-13 MED ORDER — MOMETASONE FUROATE 50 MCG/ACT NA SUSP: 2.0000 | Freq: Every day | NASAL | 5 refills | Status: DC

## 2016-01-13 NOTE — Progress Notes (Signed)
Patient: Natalie Rosales, Female    DOB: [birth year], 70 y.o.   MRN: [id number] Visit Date: 01/13/2016  Today's Provider: Vernie Murders, PA   Chief Complaint  Patient presents with  . Medicare Wellness   Subjective:    Annual wellness visit Natalie Rosales is a 70 y.o. female. She feels fairly well. She reports exercising . She reports she is sleeping fairly well.  -----------------------------------------------------------   Review of Systems  Constitutional: Negative.   HENT: Positive for hearing loss, rhinorrhea, sneezing and tinnitus.   Eyes: Negative.   Respiratory: Negative.   Cardiovascular: Negative.   Gastrointestinal: Negative.   Endocrine: Negative.   Genitourinary: Negative.   Musculoskeletal: Positive for back pain.  Skin: Negative.   Allergic/Immunologic: Positive for environmental allergies.  Neurological: Negative.   Hematological: Negative.   Psychiatric/Behavioral: Negative.     Social History   Social History  . Marital status: Married    Spouse name: [name]  . Number of children: 3  . 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   Social History Main Topics  . Smoking status: Former Research scientist (life sciences)  . Smokeless tobacco: Never Used     Comment: quit 2005  . Alcohol use Yes     Comment: Has a mixed drink about four times a week.  . Drug use: No  . Sexual activity: Not on file   Other Topics Concern  . Not on file   Social History Narrative  . No narrative on file    Past Medical History:  Diagnosis Date  . Allergy   . Anxiety   . Arthritis    back  . Back pain    lower back  . Breast cancer ([place])   . Complete heart block ([place])    Pacemaker placed 2005  . PONV (postoperative nausea and vomiting)   . Wears contact lenses   . Wears hearing aid    bilateral     Patient Active Problem List   Diagnosis Date Noted  . Special screening for malignant neoplasm of intestine   . Low  back pain 05/28/2015  . Breast CA ([place]) 11/26/2014  . Acquired complete AV block ([place]) 11/26/2014  . Combined fat and carbohydrate induced hyperlipemia 11/26/2014  . Arthritis, degenerative 11/26/2014  . Osteopenia 11/26/2014  . Artificial cardiac pacemaker 11/26/2014  . Anxiety 09/20/2014  . Body mass index (BMI) of 23.0-23.9 in adult 09/20/2014  . H/O fracture of hip 09/20/2014  . H/O malignant neoplasm of breast 09/20/2014  . Allergic rhinitis, seasonal 09/20/2014  . Gastro-esophageal reflux disease without esophagitis 01/31/2014  . Awareness of heartbeats 01/30/2014  . Closed fracture of intracapsular section of femur ([place]) 11/17/2011    Past Surgical History:  Procedure Laterality Date  . ABDOMINAL HYSTERECTOMY    . APPENDECTOMY  1982  . BREAST LUMPECTOMY Left   . CARDIAC PACEMAKER PLACEMENT  2005  . CHOLECYSTECTOMY  1982  . COLONOSCOPY WITH PROPOFOL N/A 06/10/2015   Procedure: COLONOSCOPY WITH PROPOFOL;  Surgeon: Lucilla Lame, MD;  Location: [facility];  Service: Endoscopy;  Laterality: N/A;  . HIP FRACTURE SURGERY Right 2012   Three pins     Her family history includes Bladder Cancer in her brother; Breast cancer in her sister; Cancer in her father and mother; Heart disease in her sister; Lymphoma in her brother and father; Pancreatic cancer in her father.    Current Meds  Medication Sig  . ALPRAZolam (XANAX) 0.25 MG tablet TAKE 1 TABLET BY MOUTH AT BEDTIME AS NEEDED FOR ANXIETY  . aspirin EC 81 MG tablet Take by mouth.  . celecoxib (CELEBREX) 200 MG capsule Take by mouth.  . gabapentin (NEURONTIN) 300 MG capsule     Patient Care Team: Margarita Rana, MD as PCP - General (Family Medicine)    Objective:   Vitals: BP 114/72 (BP Location: Right Arm, Patient Position: Sitting, Cuff Size: Normal)   Pulse 64   Temp 98 F (36.7 C) (Oral)   Resp 16   Wt 129 lb (58.5 kg)   BMI 22.85 kg/m   Physical Exam  Constitutional: She is oriented to person, place, and  time. She appears well-developed and well-nourished.  HENT:  Head: Normocephalic and atraumatic.  Right Ear: External ear normal.  Left Ear: External ear normal.  Nose: Nose normal.  Mouth/Throat: Oropharynx is clear and moist.  Hearing augmented with bilateral hearing aids. TM's and ear canals normal. No cerumen.  Eyes: Conjunctivae and EOM are normal. Pupils are equal, round, and reactive to light. Right eye exhibits no discharge.  Neck: Normal range of motion. Neck supple. No tracheal deviation present. No thyromegaly present.  Cardiovascular: Normal rate, regular rhythm, normal heart sounds and intact distal pulses.   No murmur heard. Pacemaker in left upper chest subcutaneously.  Pulmonary/Chest: Effort normal and breath sounds normal. No respiratory distress. She has no wheezes. She has no rales. She exhibits no tenderness.  Abdominal: Soft. Bowel sounds are normal. She exhibits no distension and no mass. There is no tenderness. There is no rebound and no guarding.  Well healed scar from abdominal hysterectomy and cholecystectomy.  Musculoskeletal: Normal range of motion. She exhibits no edema or tenderness.  SLR's 90 degrees without pain in back or hips. No sciatic pain at the present. Occurs intermittently.  Lymphadenopathy:    She has no cervical adenopathy.  Neurological: She is alert and oriented to person, place, and time. She has normal reflexes. No cranial nerve deficit. She exhibits normal muscle tone. Coordination normal.  Skin: Skin is warm and dry. No rash noted. No erythema.  Psychiatric: She has a normal mood and affect. Her behavior is normal. Judgment and thought content normal.    Activities of Daily Living In your present state of health, do you have any difficulty performing the following activities: 06/10/2015  Hearing? Y  Vision? N  Difficulty concentrating or making decisions? N  Walking or climbing stairs? N  Dressing or bathing? N  Some recent data might  be hidden    Fall Risk Assessment Fall Risk  11/26/2014  Falls in the past year? No     Depression Screen PHQ 2/9 Scores 11/26/2014  PHQ - 2 Score 0    Cognitive Testing - 6-CIT  Correct? Score   What year is it? yes 0 0 or 4  What month is it? yes 0 0 or 3  Memorize:    Pia Mau,  42,  [place],      What time is it? (within 1 hour) yes 0 0 or 3  Count backwards from 20 yes 0 0, 2, or 4  Name the months of the year yes 0 0, 2, or 4  Repeat name & address above no 1 0, 2, 4, 6, 8, or 10       TOTAL SCORE  1/28   Interpretation:  Normal  Normal (0-7) Abnormal (8-28)  Assessment & Plan:     Annual Wellness Visit  Reviewed patient's Family Medical History Reviewed and updated list of patient's medical providers Assessment of cognitive impairment was done Assessed patient's functional ability Established a written schedule for health screening [place] Completed and Reviewed  Exercise Activities and Dietary recommendations Goals    Loves to walk daily for exercise.      Immunization History  Administered Date(s) Administered  . Pneumococcal Conjugate-13 11/26/2014    Health Maintenance  Topic Date Due  . Hepatitis C Screening  09-14-1945  . TETANUS/TDAP  05/13/1964  . ZOSTAVAX  05/13/2005  . PNA vac Low Risk Adult (2 of 2 - PPSV23) 11/26/2015  . MAMMOGRAM  07/23/2016  . COLONOSCOPY  06/09/2025  . DEXA SCAN  Completed      Discussed health benefits of physical activity, and encouraged her to engage in regular exercise appropriate for her age and condition.    ------------------------------------------------------------------------------------------------------------ 1. Medicare annual wellness visit, subsequent General health stable. Continues a regular exercise program. Declines flu shot due to egg allergy. Had total abdominal hysterectomy in 1996 for cervical cancer. Reminded her she could get the Zostavax and needs a  tetanus booster. She will see about this in the near future. Some vision changes and glare problems. States optometrist suggested she see her ophthalmologist for possible cataracts (will be going to [facility]).  2. Allergic rhinitis, seasonal Seasonal rhinorrhea and sneezing. Requests refill of Nasonex. This controls symptoms well and uses it for a month each season. - mometasone (NASONEX) 50 MCG/ACT nasal spray; Place 2 sprays into the nose daily.  Dispense: 17 g; Refill: 5  3. Bilateral low back pain, with sciatica presence unspecified Good control of pain with use of Gabapentin and Celebrex daily. Followed by Dr. Sharlet Salina. Has a history of right hip fracture with surgical pinning in 2012. - celecoxib (CELEBREX) 200 MG capsule; Take 1 capsule (200 mg total) by mouth daily.  Dispense: 90 capsule; Refill: 1  4. Artificial cardiac pacemaker Had new pacemaker put in 2016  to replace the old one that was malfunctioning. Followed every 6 months by Dr. Nehemiah Massed.  5. Combined fat and carbohydrate induced hyperlipemia Cholesterol was elevated last year. Trying to stay on a low fat diet and exercise routinely. Will recheck labs. - CBC with Differential/Platelet - Comprehensive metabolic panel - Lipid panel - TSH  6. H/O malignant neoplasm of breast History of lumpectomy with 5 weeks of radiation treatment of the left breast in 2004. Due for follow up screening. Continues self-breast exams at home without masses found. - MM Digital Screening    Vernie Murders, PA  [facility]

## 2016-01-14 LAB — CBC WITH DIFFERENTIAL/PLATELET
BASOS: 0 %
Basophils Absolute: 0 10*3/uL (ref 0.0–0.2)
EOS (ABSOLUTE): 0.2 10*3/uL (ref 0.0–0.4)
EOS: 4 %
HEMATOCRIT: 40.9 % (ref 34.0–46.6)
Hemoglobin: 13.9 g/dL (ref 11.1–15.9)
IMMATURE GRANS (ABS): 0 10*3/uL (ref 0.0–0.1)
Immature Granulocytes: 0 %
LYMPHS: 33 %
Lymphocytes Absolute: 2.1 10*3/uL (ref 0.7–3.1)
MCH: 31 pg (ref 26.6–33.0)
MCHC: 34 g/dL (ref 31.5–35.7)
MCV: 91 fL (ref 79–97)
MONOCYTES: 11 %
Monocytes Absolute: 0.7 10*3/uL (ref 0.1–0.9)
NEUTROS ABS: 3.4 10*3/uL (ref 1.4–7.0)
Neutrophils: 52 %
Platelets: 291 10*3/uL (ref 150–379)
RBC: 4.48 x10E6/uL (ref 3.77–5.28)
RDW: 13.4 % (ref 12.3–15.4)
WBC: 6.4 10*3/uL (ref 3.4–10.8)

## 2016-01-14 LAB — TSH: TSH: 1.41 u[IU]/mL (ref 0.450–4.500)

## 2016-01-14 LAB — COMPREHENSIVE METABOLIC PANEL
A/G RATIO: 2.1 (ref 1.2–2.2)
ALBUMIN: 4.7 g/dL (ref 3.5–4.8)
ALT: 13 IU/L (ref 0–32)
AST: 21 IU/L (ref 0–40)
Alkaline Phosphatase: 64 IU/L (ref 39–117)
BILIRUBIN TOTAL: 0.5 mg/dL (ref 0.0–1.2)
BUN / CREAT RATIO: 18 (ref 12–28)
BUN: 15 mg/dL (ref 8–27)
CHLORIDE: 103 mmol/L (ref 96–106)
CO2: 24 mmol/L (ref 18–29)
Calcium: 9.7 mg/dL (ref 8.7–10.3)
Creatinine, Ser: 0.83 mg/dL (ref 0.57–1.00)
GFR calc non Af Amer: 72 mL/min/{1.73_m2} (ref >59)
GFR, EST AFRICAN AMERICAN: 83 mL/min/{1.73_m2} (ref >59)
GLUCOSE: 104 mg/dL — AB (ref 65–99)
Globulin, Total: 2.2 g/dL (ref 1.5–4.5)
Potassium: 4.7 mmol/L (ref 3.5–5.2)
Sodium: 143 mmol/L (ref 134–144)
TOTAL PROTEIN: 6.9 g/dL (ref 6.0–8.5)

## 2016-01-14 LAB — LIPID PANEL
Chol/HDL Ratio: 3.3 ratio units (ref 0.0–4.4)
Cholesterol, Total: 247 mg/dL — ABNORMAL HIGH (ref 100–199)
HDL: 76 mg/dL (ref >39)
LDL CALC: 151 mg/dL — AB (ref 0–99)
Triglycerides: 99 mg/dL (ref 0–149)
VLDL CHOLESTEROL CAL: 20 mg/dL (ref 5–40)

## 2016-01-15 ENCOUNTER — Telehealth: Payer: Self-pay

## 2016-01-15 NOTE — Telephone Encounter (Signed)
-----   Message from Margo Common, [place] sent at 01/14/2016  5:38 PM EDT ----- All blood tests normal except LDL high. Recommend low fat diet and Krill Oil one daily. Recheck levels to evaluate progress in 3 months.

## 2016-01-15 NOTE — Telephone Encounter (Signed)
Advised pt of lab results. Pt verbally acknowledges understanding. Emily Drozdowski, CMA   

## 2016-02-18 DIAGNOSIS — I442 Atrioventricular block, complete: Secondary | ICD-10-CM | POA: Diagnosis not present

## 2016-02-18 DIAGNOSIS — E782 Mixed hyperlipidemia: Secondary | ICD-10-CM | POA: Diagnosis not present

## 2016-04-13 ENCOUNTER — Ambulatory Visit: Payer: Self-pay | Admitting: Family Medicine

## 2016-05-04 ENCOUNTER — Encounter: Payer: Self-pay | Admitting: Family Medicine

## 2016-05-04 ENCOUNTER — Ambulatory Visit (INDEPENDENT_AMBULATORY_CARE_PROVIDER_SITE_OTHER): Payer: PPO | Admitting: Family Medicine

## 2016-05-04 VITALS — BP 98/62 | HR 74 | Temp 98.4°F | Resp 16 | Wt 134.4 lb

## 2016-05-04 DIAGNOSIS — E782 Mixed hyperlipidemia: Secondary | ICD-10-CM | POA: Diagnosis not present

## 2016-05-04 NOTE — Progress Notes (Signed)
Patient: Natalie Rosales Female    DOB: [birth year]   71 y.o.   MRN: [id number] Visit Date: 05/04/2016  Today's Provider: Vernie Murders, PA   Chief Complaint  Patient presents with  . Hyperlipidemia  . Follow-up   Subjective:    HPI   Lipid/Cholesterol, Follow-up:   Last seen for this 4 months ago.  Management since that visit includes recommended Krill Oil for elevated LDL.  Last Lipid Panel:    Component Value Date/Time   CHOL 247 (H) 01/13/2016 1055   TRIG 99 01/13/2016 1055   HDL 76 01/13/2016 1055   CHOLHDL 3.3 01/13/2016 1055   LDLCALC 151 (H) 01/13/2016 1055    She reports fair compliance with treatment. Patient reports she started taking the [facility] when recommended, but she has not taken any in the past couple weeks. She ran out and has not got anymore yet.  She is not having side effects.   Wt Readings from Last 3 Encounters:  05/04/16 134 lb 6.4 oz (61 kg)  01/13/16 129 lb (58.5 kg)  06/10/15 127 lb (57.6 kg)    ------------------------------------------------------------------------ Past Medical History:  Diagnosis Date  . Allergy   . Anxiety   . Arthritis    back  . Back pain    lower back  . Breast cancer ([place])   . Complete heart block ([place])    Pacemaker placed 2005  . PONV (postoperative nausea and vomiting)   . Wears contact lenses   . Wears hearing aid    bilateral   Past Surgical History:  Procedure Laterality Date  . ABDOMINAL HYSTERECTOMY    . APPENDECTOMY  1982  . BREAST LUMPECTOMY Left   . CARDIAC PACEMAKER PLACEMENT  2005  . CHOLECYSTECTOMY  1982  . COLONOSCOPY WITH PROPOFOL N/A 06/10/2015   Procedure: COLONOSCOPY WITH PROPOFOL;  Surgeon: Lucilla Lame, MD;  Location: [facility];  Service: Endoscopy;  Laterality: N/A;  . HIP FRACTURE SURGERY Right 2012   Three pins    Family History  Problem Relation Age of Onset  . Cancer Mother   . Cancer Father     Lymphoma or Pancreatic Cancer  . Pancreatic cancer Father   .  Lymphoma Father   . Breast cancer Sister   . Heart disease Sister   . Bladder Cancer Brother   . Lymphoma Brother    Allergies  Allergen Reactions  . Peanut-Containing Drug Products Diarrhea    All nuts - diarrhea and fever blisters  . Eggs Or Egg-Derived Products Rash    Diarrhea, fever blisters     Previous Medications   ALPRAZOLAM (XANAX) 0.25 MG TABLET    TAKE 1 TABLET BY MOUTH AT BEDTIME AS NEEDED FOR ANXIETY   ASPIRIN EC 81 MG TABLET    Take by mouth.   CELECOXIB (CELEBREX) 200 MG CAPSULE    Take 1 capsule (200 mg total) by mouth daily.   GABAPENTIN (NEURONTIN) 300 MG CAPSULE       MOMETASONE (NASONEX) 50 MCG/ACT NASAL SPRAY    Place 2 sprays into the nose daily.    Review of Systems  Constitutional: Negative.   Respiratory: Negative.   Cardiovascular: Negative.     Social History  Substance Use Topics  . Smoking status: Former Research scientist (life sciences)  . Smokeless tobacco: Never Used     Comment: quit 2005  . Alcohol use Yes     Comment: Has a mixed drink about four times a week.   Objective:   BP  98/62 (BP Location: Right Arm, Patient Position: Sitting, Cuff Size: Normal)   Pulse 74   Temp 98.4 F (36.9 C) (Oral)   Resp 16   Wt 134 lb 6.4 oz (61 kg)   SpO2 99%   BMI 23.81 kg/m   Physical Exam  Constitutional: She is oriented to person, place, and time. She appears well-developed and well-nourished.  HENT:  Head: Normocephalic.  Mouth/Throat: Oropharynx is clear and moist.  Eyes: Conjunctivae are normal.  Neck: Neck supple.  Cardiovascular: Normal rate and regular rhythm.   Pulmonary/Chest: Effort normal and breath sounds normal.  Abdominal: Soft. Bowel sounds are normal.  Neurological: She is alert and oriented to person, place, and time.      Assessment & Plan:     1. Combined fat and carbohydrate induced hyperlipemia Trying to watch fats in diet and take the [employer] daily. Encouraged to lose some weight (gained 5 lbs since September 2017) and exercise  regularly. Will recheck lipid panel. Follow up pending report. - Lipid panel

## 2016-05-05 ENCOUNTER — Telehealth: Payer: Self-pay

## 2016-05-05 LAB — LIPID PANEL
CHOL/HDL RATIO: 3.2 ratio (ref 0.0–4.4)
Cholesterol, Total: 278 mg/dL — ABNORMAL HIGH (ref 100–199)
HDL: 88 mg/dL (ref >39)
LDL Calculated: 171 mg/dL — ABNORMAL HIGH (ref 0–99)
TRIGLYCERIDES: 94 mg/dL (ref 0–149)
VLDL Cholesterol Cal: 19 mg/dL (ref 5–40)

## 2016-05-05 NOTE — Telephone Encounter (Signed)
LMTCB

## 2016-05-05 NOTE — Telephone Encounter (Signed)
Patient advised. 4 month follow up appointment scheduled.

## 2016-05-05 NOTE — Telephone Encounter (Signed)
-----   Message from Margo Common, [place] sent at 05/05/2016  8:33 AM EST ----- Total cholesterol and LDL continues to climb. Losing the weight she gained since last appointment probably will help. Continue low fat diet, regular exercise, Krill Oil qd and add Red Yeast Rice 1 qd. Recheck progress in 3-4 months.

## 2016-05-05 NOTE — Telephone Encounter (Signed)
Pt returned your call.   ° °Thanks teri  °

## 2016-05-20 DIAGNOSIS — M5136 Other intervertebral disc degeneration, lumbar region: Secondary | ICD-10-CM | POA: Diagnosis not present

## 2016-05-20 DIAGNOSIS — M5416 Radiculopathy, lumbar region: Secondary | ICD-10-CM | POA: Diagnosis not present

## 2016-05-20 DIAGNOSIS — M4726 Other spondylosis with radiculopathy, lumbar region: Secondary | ICD-10-CM | POA: Diagnosis not present

## 2016-07-30 DIAGNOSIS — I442 Atrioventricular block, complete: Secondary | ICD-10-CM | POA: Diagnosis not present

## 2016-07-30 DIAGNOSIS — E782 Mixed hyperlipidemia: Secondary | ICD-10-CM | POA: Diagnosis not present

## 2016-09-22 ENCOUNTER — Ambulatory Visit (INDEPENDENT_AMBULATORY_CARE_PROVIDER_SITE_OTHER): Payer: PPO | Admitting: Family Medicine

## 2016-09-22 ENCOUNTER — Encounter: Payer: Self-pay | Admitting: Family Medicine

## 2016-09-22 VITALS — BP 110/66 | HR 69 | Temp 98.1°F | Wt 126.2 lb

## 2016-09-22 DIAGNOSIS — Z1159 Encounter for screening for other viral diseases: Secondary | ICD-10-CM | POA: Diagnosis not present

## 2016-09-22 DIAGNOSIS — E782 Mixed hyperlipidemia: Secondary | ICD-10-CM

## 2016-09-22 DIAGNOSIS — I442 Atrioventricular block, complete: Secondary | ICD-10-CM

## 2016-09-22 NOTE — Progress Notes (Signed)
Patient: Natalie Rosales Female    DOB: 10/07/1945   71 y.o.   MRN: [id number] Visit Date: 09/22/2016  Today's Provider: Vernie Murders, PA   Chief Complaint  Patient presents with  . Hyperlipidemia  . Follow-up   Subjective:    HPI  Lipid/Cholesterol, Follow-up:   Last seen for this5 months ago.  Management changes since that visit include advised to lose weight, follow a low fat diet, regular exercise. Continue Krill Oil and add [employer]. . . Last Lipid Panel:    Component Value Date/Time   CHOL 278 (H) 05/04/2016 1308   TRIG 94 05/04/2016 1308   HDL 88 05/04/2016 1308   CHOLHDL 3.2 05/04/2016 1308   LDLCALC 171 (H) 05/04/2016 1308   She reports fair compliance with treatment. Patient states she is not taking Red Yeast Rice and only taking Krill Oil at times.  She is not having side effects.  Current symptoms include none  Weight trend: lost 8 lbs since 05/04/2016 Current diet: in general, a "healthy" diet   Current exercise: yes  Wt Readings from Last 3 Encounters:  09/22/16 126 lb 3.2 oz (57.2 kg)  05/04/16 134 lb 6.4 oz (61 kg)  01/13/16 129 lb (58.5 kg)    ------------------------------------------------------------------- Patient Active Problem List   Diagnosis Date Noted  . Special screening for malignant neoplasm of intestine   . Low back pain 05/28/2015  . Breast CA ([place]) 11/26/2014  . Acquired complete AV block ([place]) 11/26/2014  . Combined fat and carbohydrate induced hyperlipemia 11/26/2014  . Arthritis, degenerative 11/26/2014  . Osteopenia 11/26/2014  . Artificial cardiac pacemaker 11/26/2014  . Anxiety 09/20/2014  . Body mass index (BMI) of 23.0-23.9 in adult 09/20/2014  . H/O fracture of hip 09/20/2014  . H/O malignant neoplasm of breast 09/20/2014  . Allergic rhinitis, seasonal 09/20/2014  . Gastro-esophageal reflux disease without esophagitis 01/31/2014  . Awareness of heartbeats 01/30/2014  . Closed fracture of intracapsular section  of femur ([place]) 11/17/2011   Past Surgical History:  Procedure Laterality Date  . ABDOMINAL HYSTERECTOMY    . APPENDECTOMY  1982  . BREAST LUMPECTOMY Left   . CARDIAC PACEMAKER PLACEMENT  2005  . CHOLECYSTECTOMY  1982  . COLONOSCOPY WITH PROPOFOL N/A 06/10/2015   Procedure: COLONOSCOPY WITH PROPOFOL;  Surgeon: Lucilla Lame, MD;  Location: [facility];  Service: Endoscopy;  Laterality: N/A;  . HIP FRACTURE SURGERY Right 2012   Three pins    Family History  Problem Relation Age of Onset  . Cancer Mother   . Cancer Father        Lymphoma or Pancreatic Cancer  . Pancreatic cancer Father   . Lymphoma Father   . Breast cancer Sister   . Heart disease Sister   . Bladder Cancer Brother   . Lymphoma Brother    Allergies  Allergen Reactions  . Peanut-Containing Drug Products Diarrhea    All nuts - diarrhea and fever blisters  . Eggs Or Egg-Derived Products Rash    Diarrhea, fever blisters     Previous Medications   ALPRAZOLAM (XANAX) 0.25 MG TABLET    TAKE 1 TABLET BY MOUTH AT BEDTIME AS NEEDED FOR ANXIETY   ASPIRIN EC 81 MG TABLET    Take by mouth.   CELECOXIB (CELEBREX) 200 MG CAPSULE    Take 1 capsule (200 mg total) by mouth daily.   GABAPENTIN (NEURONTIN) 300 MG CAPSULE       KRILL OIL PO    Take  by mouth.   MOMETASONE (NASONEX) 50 MCG/ACT NASAL SPRAY    Place 2 sprays into the nose daily.    Review of Systems  Constitutional: Negative.   Respiratory: Negative.   Cardiovascular: Negative.   Musculoskeletal: Negative.     Social History  Substance Use Topics  . Smoking status: Former Research scientist (life sciences)  . Smokeless tobacco: Never Used     Comment: quit 2005  . Alcohol use Yes     Comment: Has a mixed drink about four times a week.   Objective:   BP 110/66 (BP Location: Right Arm, Patient Position: Sitting, Cuff Size: Normal)   Pulse 69   Temp 98.1 F (36.7 C) (Oral)   Wt 126 lb 3.2 oz (57.2 kg)   SpO2 99%   BMI 22.36 kg/m   Physical Exam  Constitutional: She is  oriented to person, place, and time. She appears well-developed and well-nourished. No distress.  HENT:  Head: Normocephalic and atraumatic.  Right Ear: Hearing normal.  Left Ear: Hearing normal.  Nose: Nose normal.  Eyes: Conjunctivae and lids are normal. Right eye exhibits no discharge. Left eye exhibits no discharge. No scleral icterus.  Neck: Neck supple.  Cardiovascular: Normal rate and regular rhythm.   Pulmonary/Chest: Effort normal and breath sounds normal. No respiratory distress.  Abdominal: Soft. Bowel sounds are normal. She exhibits no mass.  Musculoskeletal: Normal range of motion.  Neurological: She is alert and oriented to person, place, and time.  Skin: Skin is intact. No lesion and no rash noted.  Psychiatric: She has a normal mood and affect. Her speech is normal and behavior is normal. Thought content normal.      Assessment & Plan:     1. Combined fat and carbohydrate induced hyperlipemia Presently on Krill Oil daily with low fat diet. Has lost 8 lbs in the past 4 months with diet and exercise program. Will recheck lipids and follow up pending reports. - Comprehensive metabolic panel - Lipid panel  2. Acquired complete AV block ([place]) Has pacemaker functioning properly and followed by Dr. Nehemiah Massed (cardiologist).  3. Need for hepatitis C screening test - Hepatitis C Antibody

## 2016-09-23 LAB — HEPATITIS C ANTIBODY: Hep C Virus Ab: <0.1 s/co ratio (ref 0.0–0.9)

## 2016-09-23 LAB — COMPREHENSIVE METABOLIC PANEL
ALK PHOS: 65 IU/L (ref 39–117)
ALT: 23 IU/L (ref 0–32)
AST: 31 IU/L (ref 0–40)
Albumin/Globulin Ratio: 2 (ref 1.2–2.2)
Albumin: 4.5 g/dL (ref 3.5–4.8)
BUN / CREAT RATIO: 16 (ref 12–28)
BUN: 13 mg/dL (ref 8–27)
Bilirubin Total: 0.5 mg/dL (ref 0.0–1.2)
CO2: 25 mmol/L (ref 18–29)
CREATININE: 0.8 mg/dL (ref 0.57–1.00)
Calcium: 9.8 mg/dL (ref 8.7–10.3)
Chloride: 101 mmol/L (ref 96–106)
GFR calc Af Amer: 86 mL/min/{1.73_m2} (ref >59)
GFR calc non Af Amer: 74 mL/min/{1.73_m2} (ref >59)
GLUCOSE: 98 mg/dL (ref 65–99)
Globulin, Total: 2.2 g/dL (ref 1.5–4.5)
Potassium: 4.9 mmol/L (ref 3.5–5.2)
Sodium: 141 mmol/L (ref 134–144)
Total Protein: 6.7 g/dL (ref 6.0–8.5)

## 2016-09-23 LAB — LIPID PANEL
CHOLESTEROL TOTAL: 184 mg/dL (ref 100–199)
Chol/HDL Ratio: 3.5 ratio (ref 0.0–4.4)
HDL: 53 mg/dL (ref >39)
LDL CALC: 110 mg/dL — AB (ref 0–99)
TRIGLYCERIDES: 104 mg/dL (ref 0–149)
VLDL CHOLESTEROL CAL: 21 mg/dL (ref 5–40)

## 2016-12-25 ENCOUNTER — Other Ambulatory Visit: Payer: Self-pay | Admitting: Family Medicine

## 2016-12-25 DIAGNOSIS — M545 Low back pain, unspecified: Secondary | ICD-10-CM

## 2017-02-11 DIAGNOSIS — I471 Supraventricular tachycardia: Secondary | ICD-10-CM | POA: Diagnosis not present

## 2017-02-11 DIAGNOSIS — E782 Mixed hyperlipidemia: Secondary | ICD-10-CM | POA: Diagnosis not present

## 2017-02-11 DIAGNOSIS — I442 Atrioventricular block, complete: Secondary | ICD-10-CM | POA: Diagnosis not present

## 2017-02-11 DIAGNOSIS — R002 Palpitations: Secondary | ICD-10-CM | POA: Diagnosis not present

## 2017-03-03 ENCOUNTER — Other Ambulatory Visit: Payer: Self-pay

## 2017-03-03 ENCOUNTER — Encounter: Payer: Self-pay | Admitting: Family Medicine

## 2017-03-03 ENCOUNTER — Ambulatory Visit (INDEPENDENT_AMBULATORY_CARE_PROVIDER_SITE_OTHER): Payer: PPO | Admitting: Family Medicine

## 2017-03-03 VITALS — BP 116/62 | HR 76 | Temp 98.3°F | Resp 16 | Ht 63.0 in | Wt 127.0 lb

## 2017-03-03 DIAGNOSIS — M8589 Other specified disorders of bone density and structure, multiple sites: Secondary | ICD-10-CM | POA: Diagnosis not present

## 2017-03-03 DIAGNOSIS — Z853 Personal history of malignant neoplasm of breast: Secondary | ICD-10-CM

## 2017-03-03 DIAGNOSIS — S91115A Laceration without foreign body of left lesser toe(s) without damage to nail, initial encounter: Secondary | ICD-10-CM | POA: Diagnosis not present

## 2017-03-03 DIAGNOSIS — Z23 Encounter for immunization: Secondary | ICD-10-CM

## 2017-03-03 DIAGNOSIS — Z0000 Encounter for general adult medical examination without abnormal findings: Secondary | ICD-10-CM

## 2017-03-03 LAB — CBC WITH DIFFERENTIAL/PLATELET
BASOS ABS: 7 {cells}/uL (ref 0–200)
Basophils Relative: 0.1 %
EOS ABS: 273 {cells}/uL (ref 15–500)
Eosinophils Relative: 3.9 %
HCT: 38.7 % (ref 35.0–45.0)
Hemoglobin: 13.1 g/dL (ref 11.7–15.5)
Lymphs Abs: 1603 cells/uL (ref 850–3900)
MCH: 31.3 pg (ref 27.0–33.0)
MCHC: 33.9 g/dL (ref 32.0–36.0)
MCV: 92.4 fL (ref 80.0–100.0)
MONOS PCT: 10.5 %
MPV: 9.6 fL (ref 7.5–12.5)
NEUTROS PCT: 62.6 %
Neutro Abs: 4382 cells/uL (ref 1500–7800)
PLATELETS: 260 10*3/uL (ref 140–400)
RBC: 4.19 10*6/uL (ref 3.80–5.10)
RDW: 12.6 % (ref 11.0–15.0)
TOTAL LYMPHOCYTE: 22.9 %
WBC: 7 10*3/uL (ref 3.8–10.8)
WBCMIX: 735 {cells}/uL (ref 200–950)

## 2017-03-03 NOTE — Assessment & Plan Note (Signed)
With h/o hip fracture Repeat DEXA to see change Discussed Ca and Vit D, regular weight bearing exercise, avoiding tobacco, and fall precautions

## 2017-03-03 NOTE — Progress Notes (Signed)
Patient: Natalie Rosales, Female    DOB: 08/11/1945, 71 y.o.   MRN: [id number] Visit Date: 03/03/2017  Today's Provider: Lavon Paganini, MD   Chief Complaint  Patient presents with  . Medicare Wellness   Subjective:    Annual wellness visit Natalie Rosales is a 71 y.o. female. She feels fairly well. She reports she is not exercising due to right hip pain (H/O fx). She reports she is sleeping fairly well. She is having some sleep disturbance because her son-in-law has cancer.  Last colonoscopy- 06/10/2015- non-bleeding internal hemorrhoids. Repeat 10 years, Last BMD- 12/12/2014- osteopenia Last mammogram- 07/24/2014- BI-RADS 1 -----------------------------------------------------------   Review of Systems  Constitutional: Negative.   HENT: Positive for hearing loss, postnasal drip, sore throat and tinnitus.   Eyes: Negative.   Respiratory: Negative.   Cardiovascular: Negative.   Gastrointestinal: Negative.   Endocrine: Negative.   Genitourinary: Negative.   Musculoskeletal: Positive for back pain, neck pain and neck stiffness.  Skin: Negative.   Allergic/Immunologic: Positive for environmental allergies.  Neurological: Negative.   Hematological: Negative.   Psychiatric/Behavioral: Negative.   All other systems reviewed and are negative.   Social History   Socioeconomic History  . Marital status: Married    Spouse name: [name]  . Number of children: 3  . Years of education: College  . Highest education level: Not on file  Social Needs  . Financial resource strain: Not on file  . Food insecurity - worry: Not on file  . Food insecurity - inability: Not on file  . Transportation needs - medical: Not on file  . Transportation needs - non-medical: Not on file  Occupational History  . Occupation: Hair Dresser    Comment: 3 days a week.  Tobacco Use  . Smoking status: Former Research scientist (life sciences)  . Smokeless tobacco: Never Used  . Tobacco comment: quit 2005  Substance  and Sexual Activity  . Alcohol use: Yes    Alcohol/week: 4.2 oz    Types: 7 Shots of liquor per week    Comment: one mixed drink at night.  . Drug use: No  . Sexual activity: Not Currently  Other Topics Concern  . Not on file  Social History Narrative  . Not on file    Past Medical History:  Diagnosis Date  . Allergy   . Anxiety   . Arthritis    back  . Back pain    lower back  . Breast cancer ([place])   . Complete heart block ([place])    Pacemaker placed 2005  . PONV (postoperative nausea and vomiting)   . Wears contact lenses   . Wears hearing aid    bilateral     Patient Active Problem List   Diagnosis Date Noted  . Special screening for malignant neoplasm of intestine   . Low back pain 05/28/2015  . Breast CA ([place]) 11/26/2014  . Acquired complete AV block ([place]) 11/26/2014  . Combined fat and carbohydrate induced hyperlipemia 11/26/2014  . Arthritis, degenerative 11/26/2014  . Osteopenia 11/26/2014  . Artificial cardiac pacemaker 11/26/2014  . Anxiety 09/20/2014  . Body mass index (BMI) of 23.0-23.9 in adult 09/20/2014  . H/O fracture of hip 09/20/2014  . H/O malignant neoplasm of breast 09/20/2014  . Allergic rhinitis, seasonal 09/20/2014  . Gastro-esophageal reflux disease without esophagitis 01/31/2014  . Awareness of heartbeats 01/30/2014  . Closed fracture of intracapsular section of femur ([place]) 11/17/2011    Past Surgical History:  Procedure Laterality Date  .  ABDOMINAL HYSTERECTOMY    . APPENDECTOMY  1982  . BREAST LUMPECTOMY Left   . CARDIAC PACEMAKER PLACEMENT  2005  . CHOLECYSTECTOMY  1982  . HIP FRACTURE SURGERY Right 2012   Three pins     Her family history includes Bladder Cancer in her brother; Breast cancer in her sister; Cancer in her father and mother; Heart disease in her sister; Lymphoma in her brother and father; Pancreatic cancer in her father.      Current Outpatient Medications:  .  celecoxib (CELEBREX) 200 MG capsule, TAKE 1  CAPSULE (200 MG TOTAL) BY MOUTH DAILY. (Patient taking differently: Take 200 mg daily as needed by mouth. ), Disp: 90 capsule, Rfl: 1 .  gabapentin (NEURONTIN) 300 MG capsule, , Disp: , Rfl:  .  KRILL OIL PO, Take by mouth., Disp: , Rfl:  .  mometasone (NASONEX) 50 MCG/ACT nasal spray, Place 2 sprays into the nose daily., Disp: 17 g, Rfl: 5 .  rosuvastatin (CRESTOR) 5 MG tablet, Take 5 mg daily by mouth., Disp: , Rfl: 11 .  ALPRAZolam (XANAX) 0.25 MG tablet, TAKE 1 TABLET BY MOUTH AT BEDTIME AS NEEDED FOR ANXIETY (Patient not taking: Reported on 03/03/2017), Disp: 90 tablet, Rfl: 1  Patient Care Team: Virginia Crews, MD as PCP - General (Family Medicine)     Objective:   Vitals: BP 116/62 (BP Location: Right Arm, Patient Position: Sitting, Cuff Size: Normal)   Pulse 76   Temp 98.3 F (36.8 C) (Oral)   Resp 16   Ht 5\' 3"  (1.6 m)   Wt 127 lb (57.6 kg)   BMI 22.50 kg/m   Physical Exam  Constitutional: She is oriented to person, place, and time. She appears well-developed and well-nourished. No distress.  HENT:  Head: Normocephalic and atraumatic.  Right Ear: External ear normal.  Left Ear: External ear normal.  Nose: Nose normal.  Mouth/Throat: Oropharynx is clear and moist. No oropharyngeal exudate.  Eyes: Conjunctivae and EOM are normal. Pupils are equal, round, and reactive to light. No scleral icterus.  Neck: Neck supple. No thyromegaly present.  Cardiovascular: Normal rate, normal heart sounds and intact distal pulses.  No murmur heard. Pulmonary/Chest: Effort normal and breath sounds normal. No respiratory distress. She has no wheezes. She has no rales.  Breasts: breasts appear normal, no suspicious masses, no skin or nipple changes or axillary nodes, surgical scars noted on L breast. Pacemaker noted in L chest  Abdominal: Soft. Bowel sounds are normal. She exhibits no distension. There is no tenderness. There is no rebound and no guarding.  Musculoskeletal: She  exhibits no edema or deformity.  Lymphadenopathy:    She has no cervical adenopathy.  Neurological: She is alert and oriented to person, place, and time. No cranial nerve deficit.  Skin: Skin is warm and dry. No rash noted.  Small laceration on lateral 4th toe of L foot, no signs of infection  Psychiatric: She has a normal mood and affect. Her behavior is normal.  Vitals reviewed.   Activities of Daily Living In your present state of health, do you have any difficulty performing the following activities: 03/03/2017  Hearing? Y  Vision? N  Difficulty concentrating or making decisions? N  Walking or climbing stairs? Y  Dressing or bathing? N  Doing errands, shopping? N  Some recent data might be hidden  - has hearing aids  Fall Risk Assessment Fall Risk  03/03/2017 01/13/2016 11/26/2014  Falls in the past year? No No No  Depression Screen PHQ 2/9 Scores 03/03/2017 01/13/2016 11/26/2014  PHQ - 2 Score 3 0 0  PHQ- 9 Score 7 - -  - states this is related to SIL being diagnosed with cancer  Cognitive Testing - 6-CIT  Correct? Score   What year is it? yes 0 0 or 4  What month is it? yes 0 0 or 3  Memorize:    Pia Mau,  42,  [place],      What time is it? (within 1 hour) yes 0 0 or 3  Count backwards from 20 yes 0 0, 2, or 4  Name the months of the year yes 0 0, 2, or 4  Repeat name & address above no 2 0, 2, 4, 6, 8, or 10       TOTAL SCORE  2/28   Interpretation:  Normal  Normal (0-7) Abnormal (8-28)   Audit-C Alcohol Use Screening  Question Answer Points  How often do you have alcoholic drink? 1 times daily 4  On days you do drink alcohol, how many drinks do you typically consume? 1 0  How oftey will you drink 6 or more in a total? never 0  Total Score:  4   A score of 3 or more in women, and 4 or more in men indicates increased risk for alcohol abuse, EXCEPT if all of the points are from question 1.      Assessment & Plan:     Annual Wellness  Visit  Reviewed patient's Family Medical History Reviewed and updated list of patient's medical providers Assessment of cognitive impairment was done Assessed patient's functional ability Established a written schedule for health screening [place] Completed and Reviewed  Exercise Activities and Dietary recommendations Goals    None      Immunization History  Administered Date(s) Administered  . Pneumococcal Conjugate-13 11/26/2014  . Pneumococcal Polysaccharide-23 01/13/2016    Health Maintenance  Topic Date Due  . TETANUS/TDAP  05/13/1964  . MAMMOGRAM  07/23/2016  . COLONOSCOPY  06/09/2025  . DEXA SCAN  Completed  . Hepatitis C Screening  Completed  . PNA vac Low Risk Adult  Completed     Discussed health benefits of physical activity, and encouraged her to engage in regular exercise appropriate for her age and condition.    Problem List Items Addressed This Visit      Musculoskeletal and Integument   Osteopenia    With h/o hip fracture Repeat DEXA to see change Discussed Ca and Vit D, regular weight bearing exercise, avoiding tobacco, and fall precautions      Relevant Orders   DG Bone Density     Other   H/O malignant neoplasm of breast    Repeat mammogram      Relevant Orders   MM Digital Screening    Other Visit Diagnoses    Medicare annual wellness visit, subsequent    -  Primary   Relevant Orders   CBC w/Diff/Platelet   Laceration of lesser toe of left foot without foreign body present or damage to nail, initial encounter       Relevant Orders   Tdap vaccine greater than or equal to 7yo IM (Completed)      ------------------------------------------------------------------------------------------------------------ Return in about 7 months (around 10/01/2017) for HLD f/u.  The entirety of the information documented in the History of Present Illness, Review of Systems and Physical Exam were personally obtained by me. Portions  of this information were  initially documented by Martha Clan, CMA and reviewed by me for thoroughness and accuracy.     Lavon Paganini, MD  [facility]

## 2017-03-03 NOTE — Assessment & Plan Note (Signed)
Repeat mammogram 

## 2017-03-03 NOTE — Patient Instructions (Signed)
Preventive Care 65 Years and Older, Female Preventive care refers to lifestyle choices and visits with your health care provider that can promote health and wellness. What does preventive care include?  A yearly physical exam. This is also called an annual well check.  Dental exams once or twice a year.  Routine eye exams. Ask your health care provider how often you should have your eyes checked.  Personal lifestyle choices, including: ? Daily care of your teeth and gums. ? Regular physical activity. ? Eating a healthy diet. ? Avoiding tobacco and drug use. ? Limiting alcohol use. ? Practicing safe sex. ? Taking low-dose aspirin every day. ? Taking vitamin and mineral supplements as recommended by your health care provider. What happens during an annual well check? The services and screenings done by your health care provider during your annual well check will depend on your age, overall health, lifestyle risk factors, and family history of disease. Counseling Your health care provider may ask you questions about your:  Alcohol use.  Tobacco use.  Drug use.  Emotional well-being.  Home and relationship well-being.  Sexual activity.  Eating habits.  History of falls.  Memory and ability to understand (cognition).  Work and work environment.  Reproductive health.  Screening You may have the following tests or measurements:  Height, weight, and BMI.  Blood pressure.  Lipid and cholesterol levels. These may be checked every 5 years, or more frequently if you are over 50 years old.  Skin check.  Lung cancer screening. You may have this screening every year starting at age 55 if you have a 30-pack-year history of smoking and currently smoke or have quit within the past 15 years.  Fecal occult blood test (FOBT) of the stool. You may have this test every year starting at age 50.  Flexible sigmoidoscopy or colonoscopy. You may have a sigmoidoscopy every 5 years or  a colonoscopy every 10 years starting at age 50.  Hepatitis C blood test.  Hepatitis B blood test.  Sexually transmitted disease (STD) testing.  Diabetes screening. This is done by checking your blood sugar (glucose) after you have not eaten for a while (fasting). You may have this done every 1-3 years.  Bone density scan. This is done to screen for osteoporosis. You may have this done starting at age 71.  Mammogram. This may be done every 1-2 years. Talk to your health care provider about how often you should have regular mammograms.  Talk with your health care provider about your test results, treatment options, and if necessary, the need for more tests. Vaccines Your health care provider may recommend certain vaccines, such as:  Influenza vaccine. This is recommended every year.  Tetanus, diphtheria, and acellular pertussis (Tdap, Td) vaccine. You may need a Td booster every 10 years.  Varicella vaccine. You may need this if you have not been vaccinated.  Zoster vaccine. You may need this after age 60.  Measles, mumps, and rubella (MMR) vaccine. You may need at least one dose of MMR if you were born in 1957 or later. You may also need a second dose.  Pneumococcal 13-valent conjugate (PCV13) vaccine. One dose is recommended after age 71.  Pneumococcal polysaccharide (PPSV23) vaccine. One dose is recommended after age 71.  Meningococcal vaccine. You may need this if you have certain conditions.  Hepatitis A vaccine. You may need this if you have certain conditions or if you travel or work in places where you may be exposed to hepatitis   A.  Hepatitis B vaccine. You may need this if you have certain conditions or if you travel or work in places where you may be exposed to hepatitis B.  Haemophilus influenzae type b (Hib) vaccine. You may need this if you have certain conditions.  Talk to your health care provider about which screenings and vaccines you need and how often you  need them. This information is not intended to replace advice given to you by your health care provider. Make sure you discuss any questions you have with your health care provider. Document Released: 05/10/2015 Document Revised: 01/01/2016 Document Reviewed: 02/12/2015 Elsevier Interactive Patient Education  2017 [employer].

## 2017-03-04 ENCOUNTER — Telehealth: Payer: Self-pay

## 2017-03-04 NOTE — Telephone Encounter (Signed)
Pt advised.

## 2017-03-04 NOTE — Telephone Encounter (Signed)
-----   Message from Virginia Crews, MD sent at 03/04/2017  9:01 AM EST ----- Normal Blood counts.  Virginia Crews, MD, MPH [facility] 03/04/2017 9:01 AM

## 2017-03-04 NOTE — Telephone Encounter (Signed)
[email or web address]

## 2017-04-07 DIAGNOSIS — M5136 Other intervertebral disc degeneration, lumbar region: Secondary | ICD-10-CM | POA: Diagnosis not present

## 2017-04-07 DIAGNOSIS — M5416 Radiculopathy, lumbar region: Secondary | ICD-10-CM | POA: Diagnosis not present

## 2017-04-07 DIAGNOSIS — M4726 Other spondylosis with radiculopathy, lumbar region: Secondary | ICD-10-CM | POA: Diagnosis not present

## 2017-04-15 ENCOUNTER — Other Ambulatory Visit: Payer: PPO

## 2017-04-26 ENCOUNTER — Ambulatory Visit
Admission: RE | Admit: 2017-04-26 | Discharge: 2017-04-26 | Disposition: A | Discharge status: Home / Self Care | Payer: PPO | Source: Ambulatory Visit | Attending: Family Medicine | Admitting: Family Medicine

## 2017-04-26 ENCOUNTER — Other Ambulatory Visit: Payer: Self-pay | Admitting: Family Medicine

## 2017-04-26 DIAGNOSIS — Z780 Asymptomatic menopausal state: Secondary | ICD-10-CM | POA: Diagnosis not present

## 2017-04-26 DIAGNOSIS — M8589 Other specified disorders of bone density and structure, multiple sites: Secondary | ICD-10-CM

## 2017-04-26 DIAGNOSIS — M810 Age-related osteoporosis without current pathological fracture: Secondary | ICD-10-CM | POA: Insufficient documentation

## 2017-04-26 DIAGNOSIS — Z853 Personal history of malignant neoplasm of breast: Secondary | ICD-10-CM

## 2017-04-26 DIAGNOSIS — Z1231 Encounter for screening mammogram for malignant neoplasm of breast: Secondary | ICD-10-CM | POA: Diagnosis not present

## 2017-04-28 ENCOUNTER — Telehealth: Payer: Self-pay

## 2017-04-28 NOTE — Telephone Encounter (Signed)
-----   Message from Virginia Crews, MD sent at 04/28/2017  8:47 AM EST ----- Normal mammogram  Bacigalupo, Dionne Bucy, MD, MPH [facility] 04/28/2017 8:47 AM

## 2017-04-28 NOTE — Telephone Encounter (Signed)
Pt advised.

## 2017-04-28 NOTE — Telephone Encounter (Signed)
Pt advised and agrees with treatment plan. 

## 2017-04-28 NOTE — Telephone Encounter (Signed)
-----   Message from Virginia Crews, MD sent at 04/28/2017  3:14 PM EST ----- Osteopenia (early bone loss) has not changed in femur.  Unable to tell if there is any bone loss in spine or R leg due to surgery and arthritis.  There is osteoporosis of bilateral forearms.  This does not meet criteria for treatment of osteoporosis, though.  Recommend plenty of dietary Ca intake and Vit D.  Exercise, such as walking, aerobics, etc recommended to prevent further bone loss. Recheck in 2 years.  Virginia Crews, MD, MPH [facility] 04/28/2017 3:14 PM

## 2017-05-13 DIAGNOSIS — H524 Presbyopia: Secondary | ICD-10-CM | POA: Diagnosis not present

## 2017-08-11 DIAGNOSIS — M4726 Other spondylosis with radiculopathy, lumbar region: Secondary | ICD-10-CM | POA: Diagnosis not present

## 2017-08-11 DIAGNOSIS — M5136 Other intervertebral disc degeneration, lumbar region: Secondary | ICD-10-CM | POA: Diagnosis not present

## 2017-08-11 DIAGNOSIS — M5416 Radiculopathy, lumbar region: Secondary | ICD-10-CM | POA: Diagnosis not present

## 2017-09-08 ENCOUNTER — Ambulatory Visit: Payer: Self-pay | Admitting: Family Medicine

## 2017-09-27 ENCOUNTER — Ambulatory Visit (INDEPENDENT_AMBULATORY_CARE_PROVIDER_SITE_OTHER): Payer: PPO | Admitting: Family Medicine

## 2017-09-27 ENCOUNTER — Encounter: Payer: Self-pay | Admitting: Family Medicine

## 2017-09-27 VITALS — BP 122/70 | HR 77 | Temp 97.8°F | Resp 16 | Wt 118.0 lb

## 2017-09-27 DIAGNOSIS — R1032 Left lower quadrant pain: Secondary | ICD-10-CM

## 2017-09-27 DIAGNOSIS — F419 Anxiety disorder, unspecified: Secondary | ICD-10-CM | POA: Diagnosis not present

## 2017-09-27 DIAGNOSIS — F4323 Adjustment disorder with mixed anxiety and depressed mood: Secondary | ICD-10-CM | POA: Diagnosis not present

## 2017-09-27 DIAGNOSIS — E782 Mixed hyperlipidemia: Secondary | ICD-10-CM

## 2017-09-27 DIAGNOSIS — K5900 Constipation, unspecified: Secondary | ICD-10-CM | POA: Diagnosis not present

## 2017-09-27 DIAGNOSIS — R5383 Other fatigue: Secondary | ICD-10-CM | POA: Diagnosis not present

## 2017-09-27 MED ORDER — ESCITALOPRAM OXALATE 5 MG PO TABS: 5.0000 mg | ORAL_TABLET | Freq: Every day | ORAL | 2 refills | Status: DC

## 2017-09-27 MED ORDER — ALPRAZOLAM 0.25 MG PO TABS: 0.2500 mg | ORAL_TABLET | Freq: Every evening | ORAL | 1 refills | Status: DC | PRN

## 2017-09-27 NOTE — Assessment & Plan Note (Signed)
As above, may be related to her abdominal pain Start daily MiraLAX and titrate to 1 soft bowel movement daily Return precautions discussed

## 2017-09-27 NOTE — Progress Notes (Signed)
Patient: Natalie Rosales Female    DOB: 1946-01-29   72 y.o.   MRN: [id number] Visit Date: 09/27/2017  Today's Provider: Lavon Paganini, MD   I, Martha Clan, CMA, am acting as scribe for Lavon Paganini, MD.  Chief Complaint  Patient presents with  . Anxiety  . Hyperlipidemia   Subjective:    Anxiety  Presents for follow-up visit. Symptoms include chest pain, decreased concentration, depressed mood (crying), excessive worry, malaise, nervous/anxious behavior, palpitations (pt states she noticed internal "quivering"; is unsure if it is palpitations (pacemaker present) vs. abdominal issues), panic and shortness of breath. Patient reports no compulsions, feeling of choking, insomnia (alprazolam is improving sleep) or nausea. Primary symptoms comment: pt is c/o fatigue, and abdominal pain. Current severity: is worsening since her son-in-law died due to cancer.   Compliance with medications: Pt is now having to take alprazolam nightly.   Last Xanax refill was in 07/2015.  She has not used this in quite some time.  She tried using it as needed, and is increased to taking it nightly.  She feels like she is not in control of her emotions currently.  She is intermittently crying and having difficulty sleeping.  In addition to her son-in-law dying recently, she is concerned about her daughter and grandchildren.  Her husband is also having surgery for a brain aneurysm tomorrow.  She reports left lower quadrant abdominal pain for about 2 months.  She does not feel the pain during the day at all, but when laying down to sleep at night she notices the pain and tenderness in her left lower quadrant.  She sometimes able to massage this past some gas and it will improve.  She states she has bowel movements every 2 to 3 days.  She will have sometimes where this is difficult to pass.  She does not take any medications for.  She denies any nausea, vomiting, diarrhea fevers, decreased appetite.  She  is status post hysterectomy and BSO.       Lipid/Cholesterol, Follow-up:   Last seen for this 1 year ago.  Management changes since that visit include continuing low fat diet and krill oil. . Last Lipid Panel:    Component Value Date/Time   CHOL 184 09/22/2016 0936   TRIG 104 09/22/2016 0936   HDL 53 09/22/2016 0936   CHOLHDL 3.5 09/22/2016 0936   LDLCALC 110 (H) 09/22/2016 0936    Pt states her cardiologist advised her to take rosuvastatin q 2-3 days for cholesterol. She is not taking krill oil. She is fasting for labs.  Wt Readings from Last 3 Encounters:  09/27/17 118 lb (53.5 kg)  03/03/17 127 lb (57.6 kg)  09/22/16 126 lb 3.2 oz (57.2 kg)    -------------------------------------------------------------------   Allergies  Allergen Reactions  . Peanut-Containing Drug Products Diarrhea    All nuts - diarrhea and fever blisters  . Eggs Or Egg-Derived Products Rash    Diarrhea, fever blisters     Current Outpatient Medications:  .  ALPRAZolam (XANAX) 0.25 MG tablet, Take 1 tablet (0.25 mg total) by mouth at bedtime as needed for anxiety., Disp: 30 tablet, Rfl: 1 .  celecoxib (CELEBREX) 200 MG capsule, TAKE 1 CAPSULE (200 MG TOTAL) BY MOUTH DAILY. (Patient taking differently: Take 200 mg daily as needed by mouth. ), Disp: 90 capsule, Rfl: 1 .  mometasone (NASONEX) 50 MCG/ACT nasal spray, Place 2 sprays into the nose daily. (Patient taking differently: Place 2 sprays into  the nose daily as needed. ), Disp: 17 g, Rfl: 5 .  rosuvastatin (CRESTOR) 5 MG tablet, Take 5 mg by mouth 3 (three) times a week. Per Dr. Nehemiah Massed, ok to take every 2-3 days, Disp: , Rfl: 11 .  escitalopram (LEXAPRO) 5 MG tablet, Take 1 tablet (5 mg total) by mouth daily., Disp: 30 tablet, Rfl: 2  Review of Systems  Constitutional: Positive for fatigue. Negative for activity change, appetite change, chills, diaphoresis, fever and unexpected weight change.  HENT: Negative.   Respiratory: Positive for  shortness of breath. Negative for apnea, cough, choking, chest tightness, wheezing and stridor.   Cardiovascular: Positive for chest pain and palpitations (pt states she noticed internal "quivering"; is unsure if it is palpitations (pacemaker present) vs. abdominal issues). Negative for leg swelling.  Gastrointestinal: Positive for abdominal pain. Negative for abdominal distention, anal bleeding, blood in stool, constipation, diarrhea, nausea, rectal pain and vomiting.  Genitourinary: Negative.   Musculoskeletal: Negative.   Skin: Negative.   Neurological: Negative.   Psychiatric/Behavioral: Positive for decreased concentration. Negative for agitation. The patient is nervous/anxious. The patient does not have insomnia (alprazolam is improving sleep).     Social History   Tobacco Use  . Smoking status: Former Research scientist (life sciences)  . Smokeless tobacco: Never Used  . Tobacco comment: quit 2005  Substance Use Topics  . Alcohol use: Yes    Alcohol/week: 4.2 oz    Types: 7 Shots of liquor per week    Comment: one mixed drink at night.   Objective:   BP 122/70 (BP Location: Right Arm, Patient Position: Sitting, Cuff Size: Normal)   Pulse 77   Temp 97.8 F (36.6 C) (Oral)   Resp 16   Wt 118 lb (53.5 kg)   SpO2 98%   BMI 20.90 kg/m  Vitals:   09/27/17 0842  BP: 122/70  Pulse: 77  Resp: 16  Temp: 97.8 F (36.6 C)  TempSrc: Oral  SpO2: 98%  Weight: 118 lb (53.5 kg)     Physical Exam  Constitutional: She is oriented to person, place, and time. She appears well-developed and well-nourished. No distress.  HENT:  Head: Normocephalic and atraumatic.  Right Ear: External ear normal.  Left Ear: External ear normal.  Nose: Nose normal.  Mouth/Throat: Oropharynx is clear and moist. No oropharyngeal exudate.  Eyes: Pupils are equal, round, and reactive to light. Conjunctivae and EOM are normal. Right eye exhibits no discharge. Left eye exhibits no discharge. No scleral icterus.  Neck: Neck supple.  No thyromegaly present.  Cardiovascular: Normal rate, regular rhythm, normal heart sounds and intact distal pulses.  No murmur heard. Pulmonary/Chest: Effort normal and breath sounds normal. No respiratory distress. She has no wheezes. She has no rales.  Abdominal: Soft. Bowel sounds are normal. She exhibits no distension. There is no tenderness. There is no rebound.  Musculoskeletal: She exhibits no edema or deformity.  Lymphadenopathy:    She has no cervical adenopathy.  Neurological: She is alert and oriented to person, place, and time.  Skin: Skin is warm and dry. Capillary refill takes less than 2 seconds. No rash noted.  Psychiatric: Her speech is normal and behavior is normal. Judgment normal. Her mood appears anxious. Cognition and memory are normal. She exhibits a depressed mood. She expresses no homicidal and no suicidal ideation. She expresses no suicidal plans and no homicidal plans.  Intermittently tearful  Vitals reviewed.   Depression screen [facility] 2/9 09/27/2017 03/03/2017 01/13/2016 11/26/2014  Decreased Interest 1 1 0 0  Down, Depressed, Hopeless 1 2 0 0  PHQ - 2 Score 2 3 0 0  Altered sleeping 1 2 - -  Tired, decreased energy 2 2 - -  Change in appetite 0 0 - -  Feeling bad or failure about yourself  0 0 - -  Trouble concentrating 1 0 - -  Moving slowly or fidgety/restless 0 0 - -  Suicidal thoughts 0 0 - -  PHQ-9 Score 6 7 - -  Difficult doing work/chores Not difficult at all Not difficult at all - -     GAD 7 : Generalized Anxiety Score 09/27/2017  Nervous, Anxious, on Edge 1  Control/stop worrying 2  Worry too much - different things 2  Trouble relaxing 1  Restless 1  Easily annoyed or irritable 2  Afraid - awful might happen 1  Total GAD 7 Score 10  Anxiety Difficulty Somewhat difficult       Assessment & Plan:     Problem List Items Addressed This Visit      Other   Anxiety - Primary    Uncontrolled Recently exacerbated by death of her son-in-law and  other stressful situations Discussed SSRI therapy they can provide more benefit than chronic benzo use Patient is also hesitant about benzo use due to addictive nature Decision was made to start Lexapro at low dose to see if this will help Can continue Xanax use in the meantime, plan to decrease this again in the future Follow-up in 6 weeks      Relevant Medications   escitalopram (LEXAPRO) 5 MG tablet   ALPRAZolam (XANAX) 0.25 MG tablet   Other Relevant Orders   TSH   Combined fat and carbohydrate induced hyperlipemia    Patient is asymptomatic Continue Crestor 2-3 times weekly Recheck lipid panel and CMP Pending LDL, will reevaluate dosing      Relevant Orders   Lipid panel   Comprehensive metabolic panel   Adjustment disorder with mixed anxiety and depressed mood    As above, discussed SSRI therapy Start Lexapro 5 mg daily Discussed possible side effects including GI upset, decreased libido Discussed that it can take 6 to 8 weeks to reach full efficacy Advised on importance of therapy/counseling as well Can continue Xanax in the meantime, but plan to taper off in the future Follow-up in 6 weeks and repeat PHQ 9 and gad 7      Left lower quadrant pain    Benign abdominal exam today with no tenderness on palpation Given patient's chronic and intermittent constipation, suspect this may be related to that Start treatment of constipation with daily MiraLAX 1 cap Advised to titrate to 1 soft bowel movement daily Discussed return precautions Follow-up in 6 weeks at next visit and if continues to occur, consider GI referral or further imaging      Relevant Orders   Comprehensive metabolic panel   CBC   Constipation    As above, may be related to her abdominal pain Start daily MiraLAX and titrate to 1 soft bowel movement daily Return precautions discussed       Other Visit Diagnoses    Fatigue, unspecified type       Relevant Orders   CBC   TSH       Return in  about 6 weeks (around 11/08/2017) for anxiety f/u.   The entirety of the information documented in the History of Present Illness, Review of Systems and Physical Exam were personally obtained by me. Portions of this  information were initially documented by Martha Clan, CMA and reviewed by me for thoroughness and accuracy.    Virginia Crews, MD, MPH [facility] 09/27/2017 10:26 AM

## 2017-09-27 NOTE — Assessment & Plan Note (Signed)
Benign abdominal exam today with no tenderness on palpation Given patient's chronic and intermittent constipation, suspect this may be related to that Start treatment of constipation with daily MiraLAX 1 cap Advised to titrate to 1 soft bowel movement daily Discussed return precautions Follow-up in 6 weeks at next visit and if continues to occur, consider GI referral or further imaging

## 2017-09-27 NOTE — Assessment & Plan Note (Signed)
Uncontrolled Recently exacerbated by death of her son-in-law and other stressful situations Discussed SSRI therapy they can provide more benefit than chronic benzo use Patient is also hesitant about benzo use due to addictive nature Decision was made to start Lexapro at low dose to see if this will help Can continue Xanax use in the meantime, plan to decrease this again in the future Follow-up in 6 weeks

## 2017-09-27 NOTE — Patient Instructions (Signed)
Adjustment Disorder, Adult Adjustment disorder is a group of symptoms that can develop after a stressful life event, such as the loss of a job or serious physical illness. The symptoms can affect how you feel, think, and act. They may interfere with your relationships. Adjustment disorder increases your risk of suicide and substance abuse. If this disorder is not managed early, it can develop into a more serious condition, such as major depressive disorder or post-traumatic stress disorder. What are the causes? This condition happens when you have trouble recovering from or coping with a stressful life event. What increases the risk? You are more likely to develop this condition if:  You have had depression or anxiety.  You are being treated for a long-term (chronic) illness.  You are being treated for an illness that cannot be cured (terminal illness).  You have a family history of mental illness.  What are the signs or symptoms? Symptoms of this condition include:  Extreme trouble doing daily tasks, such as going to work.  Sadness, depression, or crying spells.  Worrying a lot.  Loss of enjoyment.  Change in appetite or weight.  Feelings of loss or hopelessness.  Thoughts of suicide.  Anxiety, worry, or nervousness.  Trouble sleeping.  Avoiding family and friends.  Fighting or vandalism.  Complaining of feeling sick without being ill.  Feeling dazed or disconnected.  Nightmares.  Trouble sleeping.  Irritability.  Reckless driving.  Poor work Systems analyst.  Ignoring bills.  Symptoms of this condition start within three months of the stressful event. They do not last more than six months, unless the stressful circumstances last longer. Normal grieving after the death of a loved one is not a symptom of this condition. How is this diagnosed? To diagnose this condition, your health care provider will ask about what has happened in your life and how it has  affected you. He or she may also ask about your medical history and your use of medicines, alcohol, and other substances. Your health care provider may do a physical exam and order lab tests or other studies. You may be referred to a mental health specialist. How is this treated? Treatment options for this condition include:  Counseling or talk therapy. Talk therapy is usually provided by mental health specialists.  Medicines. Certain medicines may help with depression, anxiety, and sleep.  Support groups. These offer emotional support, advice, and guidance. They are made up of people who have had similar experiences.  Observation and time. This is sometimes called "watchful waiting." In this treatment, health care providers monitor your health and behavior without other treatment. Adjustment disorder sometimes gets better on its own with time.  Follow these instructions at home:  Take over-the-counter and prescription medicines only as told by your health care provider.  Keep all follow-up visits as told by your health care provider. This is important. Contact a health care provider if:  Your symptoms do not improve in six months.  Your symptoms get worse. Get help right away if:  You have serious thoughts about hurting yourself or someone else. If you ever feel like you may hurt yourself or others, or have thoughts about taking your own life, get help right away. You can go to your nearest emergency department or call:  Your local emergency services (911 in the [place]  A suicide crisis helpline, such as the [facility] at (762) 705-9243. This is open 24 hours a day.  Summary  Adjustment disorder is a group of  symptoms that can develop after a stressful life event, such as the loss of a job or serious physical illness. The symptoms can affect how you feel, think, and act. They may interfere with your relationships.  Symptoms of this condition start within  three months of the stressful event. They do not last more than six months, unless the stressful circumstances last longer.  Treatment may include talk therapy, medicines, participation in a support group, or observation to see if symptoms improve.  Contact your health care provider if your symptoms get worse or do not improve in six months.  If you ever feel like you may hurt yourself or others, or have thoughts about taking your own life, get help right away. This information is not intended to replace advice given to you by your health care provider. Make sure you discuss any questions you have with your health care provider. Document Released: 12/16/2005 Document Revised: 06/12/2016 Document Reviewed: 06/12/2016 Elsevier Interactive Patient Education  [employer].

## 2017-09-27 NOTE — Assessment & Plan Note (Signed)
As above, discussed SSRI therapy Start Lexapro 5 mg daily Discussed possible side effects including GI upset, decreased libido Discussed that it can take 6 to 8 weeks to reach full efficacy Advised on importance of therapy/counseling as well Can continue Xanax in the meantime, but plan to taper off in the future Follow-up in 6 weeks and repeat PHQ 9 and gad 7

## 2017-09-27 NOTE — Assessment & Plan Note (Signed)
Patient is asymptomatic Continue Crestor 2-3 times weekly Recheck lipid panel and CMP Pending LDL, will reevaluate dosing

## 2017-09-28 ENCOUNTER — Telehealth: Payer: Self-pay

## 2017-09-28 LAB — CBC
HEMATOCRIT: 39 % (ref 34.0–46.6)
HEMOGLOBIN: 12.5 g/dL (ref 11.1–15.9)
MCH: 29.7 pg (ref 26.6–33.0)
MCHC: 32.1 g/dL (ref 31.5–35.7)
MCV: 93 fL (ref 79–97)
Platelets: 278 10*3/uL (ref 150–450)
RBC: 4.21 x10E6/uL (ref 3.77–5.28)
RDW: 14.3 % (ref 12.3–15.4)
WBC: 6.7 10*3/uL (ref 3.4–10.8)

## 2017-09-28 LAB — LIPID PANEL
CHOL/HDL RATIO: 2.5 ratio (ref 0.0–4.4)
Cholesterol, Total: 147 mg/dL (ref 100–199)
HDL: 59 mg/dL (ref >39)
LDL Calculated: 71 mg/dL (ref 0–99)
Triglycerides: 83 mg/dL (ref 0–149)
VLDL Cholesterol Cal: 17 mg/dL (ref 5–40)

## 2017-09-28 LAB — COMPREHENSIVE METABOLIC PANEL
ALK PHOS: 67 IU/L (ref 39–117)
ALT: 10 IU/L (ref 0–32)
AST: 18 IU/L (ref 0–40)
Albumin/Globulin Ratio: 2.1 (ref 1.2–2.2)
Albumin: 4.4 g/dL (ref 3.5–4.8)
BUN / CREAT RATIO: 18 (ref 12–28)
BUN: 17 mg/dL (ref 8–27)
Bilirubin Total: 0.6 mg/dL (ref 0.0–1.2)
CO2: 24 mmol/L (ref 20–29)
CREATININE: 0.94 mg/dL (ref 0.57–1.00)
Calcium: 9.8 mg/dL (ref 8.7–10.3)
Chloride: 103 mmol/L (ref 96–106)
GFR calc non Af Amer: 61 mL/min/{1.73_m2} (ref >59)
GFR, EST AFRICAN AMERICAN: 70 mL/min/{1.73_m2} (ref >59)
GLUCOSE: 93 mg/dL (ref 65–99)
Globulin, Total: 2.1 g/dL (ref 1.5–4.5)
Potassium: 4.7 mmol/L (ref 3.5–5.2)
Sodium: 141 mmol/L (ref 134–144)
TOTAL PROTEIN: 6.5 g/dL (ref 6.0–8.5)

## 2017-09-28 LAB — TSH: TSH: 1.96 u[IU]/mL (ref 0.450–4.500)

## 2017-09-28 NOTE — Telephone Encounter (Signed)
-----   Message from Virginia Crews, MD sent at 09/28/2017  8:29 AM EDT ----- Cholesterol looks great.  Normal Thyroid function, kidney function, liver function, electrolytes, Blood counts.  Virginia Crews, MD, MPH [place] 09/28/2017 8:29 AM

## 2017-09-28 NOTE — Telephone Encounter (Signed)
Left message advising pt. OK per DPR. 

## 2017-10-19 ENCOUNTER — Other Ambulatory Visit: Payer: Self-pay | Admitting: Family Medicine

## 2017-10-20 DIAGNOSIS — I442 Atrioventricular block, complete: Secondary | ICD-10-CM | POA: Diagnosis not present

## 2017-10-20 DIAGNOSIS — R002 Palpitations: Secondary | ICD-10-CM | POA: Diagnosis not present

## 2017-10-20 DIAGNOSIS — E782 Mixed hyperlipidemia: Secondary | ICD-10-CM | POA: Diagnosis not present

## 2017-10-20 NOTE — Telephone Encounter (Signed)
Dr. B patient. Pharmacy is requesting a 90 day supply.

## 2017-11-15 ENCOUNTER — Ambulatory Visit (INDEPENDENT_AMBULATORY_CARE_PROVIDER_SITE_OTHER): Payer: PPO | Admitting: Family Medicine

## 2017-11-15 ENCOUNTER — Encounter: Payer: Self-pay | Admitting: Family Medicine

## 2017-11-15 VITALS — BP 100/62 | HR 68 | Temp 98.5°F | Resp 16 | Wt 117.0 lb

## 2017-11-15 DIAGNOSIS — F4323 Adjustment disorder with mixed anxiety and depressed mood: Secondary | ICD-10-CM

## 2017-11-15 DIAGNOSIS — F419 Anxiety disorder, unspecified: Secondary | ICD-10-CM

## 2017-11-15 DIAGNOSIS — M67442 Ganglion, left hand: Secondary | ICD-10-CM | POA: Insufficient documentation

## 2017-11-15 MED ORDER — ESCITALOPRAM OXALATE 5 MG PO TABS: 5.0000 mg | ORAL_TABLET | Freq: Every day | ORAL | 2 refills | Status: DC

## 2017-11-15 NOTE — Assessment & Plan Note (Signed)
Much improved with low-dose Lexapro Has only needed Xanax 2-3 times since last visit Was recently exacerbated by the death of her son-in-law and other stressful situations Discussed that we should continue at least 6 months of SSRI therapy before possible discontinuation

## 2017-11-15 NOTE — Assessment & Plan Note (Signed)
As above, started after the death of her son-in-law Is improving and is stabilized on low-dose Lexapro She is having no side effects and tolerating well She is not interested in dose titration and believes she is good enough on current dose She is not needing Xanax much at all Advised on importance of therapy/counseling as well Would like to continue SSRI therapy for at least 6 months and total

## 2017-11-15 NOTE — Patient Instructions (Signed)
Ganglion Cyst A ganglion cyst is a noncancerous, fluid-filled lump that occurs near joints or tendons. The ganglion cyst grows out of a joint or the lining of a tendon. It most often develops in the hand or wrist, but it can also develop in the shoulder, elbow, hip, knee, ankle, or foot. The round or oval ganglion cyst can be the size of a pea or larger than a grape. Increased activity may enlarge the size of the cyst because more fluid starts to build up. What are the causes? It is not known what causes a ganglion cyst to grow. However, it may be related to:  Inflammation or irritation around the joint.  An injury.  Repetitive movements or overuse.  Arthritis.  What increases the risk? Risk factors include:  Being a woman.  Being age 20-50.  What are the signs or symptoms? Symptoms may include:  A lump. This most often appears on the hand or wrist, but it can occur in other areas of the body.  Tingling.  Pain.  Numbness.  Muscle weakness.  Weak grip.  Less movement in a joint.  How is this diagnosed? Ganglion cysts are most often diagnosed based on a physical exam. Your health care provider will feel the lump and may shine a light alongside it. If it is a ganglion cyst, a light often shines through it. Your health care provider may order an X-ray, ultrasound, or MRI to rule out other conditions. How is this treated? Ganglion cysts usually go away on their own without treatment. If pain or other symptoms are involved, treatment may be needed. Treatment is also needed if the ganglion cyst limits your movement or if it gets infected. Treatment may include:  Wearing a brace or splint on your wrist or finger.  Taking anti-inflammatory medicine.  Draining fluid from the lump with a needle (aspiration).  Injecting a steroid into the joint.  Surgery to remove the ganglion cyst.  Follow these instructions at home:  Do not press on the ganglion cyst, poke it with a  needle, or hit it.  Take medicines only as directed by your health care provider.  Wear your brace or splint as directed by your health care provider.  Watch your ganglion cyst for any changes.  Keep all follow-up visits as directed by your health care provider. This is important. Contact a health care provider if:  Your ganglion cyst becomes larger or more painful.  You have increased redness, red streaks, or swelling.  You have pus coming from the lump.  You have weakness or numbness in the affected area.  You have a fever or chills. This information is not intended to replace advice given to you by your health care provider. Make sure you discuss any questions you have with your health care provider. Document Released: 04/10/2000 Document Revised: 09/19/2015 Document Reviewed: 09/26/2013 Elsevier Interactive Patient Education  2018 Elsevier Inc.  

## 2017-11-15 NOTE — Assessment & Plan Note (Signed)
Exam consistent with ganglion cyst Discussed option for hand surgery referral for possible aspiration/injection May also resolve independently Patient to call back if she decides she wants a referral, but she declines at this time

## 2017-11-15 NOTE — Progress Notes (Signed)
Patient: Natalie Rosales Female    DOB: [birth year]   72 y.o.   MRN: [id number] Visit Date: 11/15/2017  Today's Provider: Lavon Paganini, MD   I, Martha Clan, CMA, am acting as scribe for Lavon Paganini, MD.  Chief Complaint  Patient presents with  . Anxiety   Subjective:    Anxiety  Presents for follow-up (LOV 6 weeks ago. Pt was started on Lexapro 5 mg and alprazolam 0.25 mg po PRN due to recent death of son-in-law) visit. Symptoms include depressed mood (some sadness due to concern of her daughter), irritability, malaise and palpitations (improved with Lexapro). Patient reports no chest pain, compulsions, confusion, decreased concentration, dizziness, dry mouth, excessive worry, feeling of choking, hyperventilation, insomnia, muscle tension (is now able to work out on treadmill again ), nausea, nervous/anxious behavior, obsessions, panic, restlessness, shortness of breath or suicidal ideas. The severity of symptoms is mild (improving). The quality of sleep is good.   Compliance with medications: good compliance. Treatment side effects: no side effects.   Has only needed Alprazolam about 2-3 times since starting Lexapro.  A lot less crying.  Palpitations discussed with cardiologist  Depression screen [facility] 2/9 11/15/2017 09/27/2017 03/03/2017  Decreased Interest 0 1 1  Down, Depressed, Hopeless 0 1 2  PHQ - 2 Score 0 2 3  Altered sleeping 1 1 2   Tired, decreased energy 1 2 2   Change in appetite 0 0 0  Feeling bad or failure about yourself  0 0 0  Trouble concentrating 1 1 0  Moving slowly or fidgety/restless 1 0 0  Suicidal thoughts 0 0 0  PHQ-9 Score 4 6 7   Difficult doing work/chores Not difficult at all Not difficult at all Not difficult at all   GAD 7 : Generalized Anxiety Score 11/15/2017 09/27/2017  Nervous, Anxious, on Edge 0 1  Control/stop worrying 1 2  Worry too much - different things 1 2  Trouble relaxing 0 1  Restless 0 1  Easily annoyed or irritable 1  2  Afraid - awful might happen 1 1  Total GAD 7 Score 4 10  Anxiety Difficulty Not difficult at all Somewhat difficult     Pt has noticed a cyst on her right 3rd finger. It has been there ~4 months.  She would like this examined today.     Allergies  Allergen Reactions  . Peanut-Containing Drug Products Diarrhea    All nuts - diarrhea and fever blisters  . Eggs Or Egg-Derived Products Rash    Diarrhea, fever blisters     Current Outpatient Medications:  .  ALPRAZolam (XANAX) 0.25 MG tablet, Take 1 tablet (0.25 mg total) by mouth at bedtime as needed for anxiety., Disp: 30 tablet, Rfl: 1 .  celecoxib (CELEBREX) 200 MG capsule, TAKE 1 CAPSULE (200 MG TOTAL) BY MOUTH DAILY., Disp: 90 capsule, Rfl: 1 .  escitalopram (LEXAPRO) 5 MG tablet, TAKE 1 TABLET BY MOUTH EVERY DAY, Disp: 90 tablet, Rfl: 0 .  mometasone (NASONEX) 50 MCG/ACT nasal spray, Place 2 sprays into the nose daily. (Patient taking differently: Place 2 sprays into the nose daily as needed. ), Disp: 17 g, Rfl: 5 .  rosuvastatin (CRESTOR) 5 MG tablet, Take 5 mg by mouth 3 (three) times a week. Per Dr. Nehemiah Massed, ok to take every 2-3 days, Disp: , Rfl: 11  Review of Systems  Constitutional: Positive for irritability.  Respiratory: Negative for shortness of breath.   Cardiovascular: Positive for palpitations (improved with Lexapro).  Negative for chest pain.  Gastrointestinal: Negative for nausea.  Neurological: Negative for dizziness.  Psychiatric/Behavioral: Negative for confusion, decreased concentration and suicidal ideas. The patient is not nervous/anxious and does not have insomnia.     Social History   Tobacco Use  . Smoking status: Former Research scientist (life sciences)  . Smokeless tobacco: Never Used  . Tobacco comment: quit 2005  Substance Use Topics  . Alcohol use: Yes    Alcohol/week: 4.2 oz    Types: 7 Shots of liquor per week    Comment: one mixed drink at night.   Objective:   BP 100/62 (BP Location: Right Arm, Patient  Position: Sitting, Cuff Size: Normal)   Pulse 68   Temp 98.5 F (36.9 C) (Oral)   Resp 16   Wt 117 lb (53.1 kg)   SpO2 99%   BMI 20.73 kg/m  Vitals:   11/15/17 1050  BP: 100/62  Pulse: 68  Resp: 16  Temp: 98.5 F (36.9 C)  TempSrc: Oral  SpO2: 99%  Weight: 117 lb (53.1 kg)     Physical Exam  Constitutional: She is oriented to person, place, and time. She appears well-developed and well-nourished. No distress.  HENT:  Head: Normocephalic and atraumatic.  Eyes: Conjunctivae are normal. Right eye exhibits no discharge. Left eye exhibits no discharge. No scleral icterus.  Cardiovascular: Normal rate, regular rhythm, normal heart sounds and intact distal pulses.  No murmur heard. Pulmonary/Chest: Effort normal and breath sounds normal. No respiratory distress. She has no wheezes. She has no rales.  Musculoskeletal: She exhibits no edema.  Ganglion cyst on the dorsum of left middle finger DIP  Neurological: She is alert and oriented to person, place, and time.  Skin: Skin is warm and dry. Capillary refill takes less than 2 seconds. No rash noted. No erythema.  Psychiatric: Her speech is normal and behavior is normal. Thought content normal. Her affect is blunt. Cognition and memory are normal. She expresses no homicidal and no suicidal ideation. She expresses no suicidal plans and no homicidal plans.  Vitals reviewed.      Assessment & Plan:   Problem List Items Addressed This Visit      Other   Anxiety    Much improved with low-dose Lexapro Has only needed Xanax 2-3 times since last visit Was recently exacerbated by the death of her son-in-law and other stressful situations Discussed that we should continue at least 6 months of SSRI therapy before possible discontinuation      Relevant Medications   escitalopram (LEXAPRO) 5 MG tablet   Adjustment disorder with mixed anxiety and depressed mood - Primary    As above, started after the death of her son-in-law Is  improving and is stabilized on low-dose Lexapro She is having no side effects and tolerating well She is not interested in dose titration and believes she is good enough on current dose She is not needing Xanax much at all Advised on importance of therapy/counseling as well Would like to continue SSRI therapy for at least 6 months and total      Ganglion cyst of finger of left hand    Exam consistent with ganglion cyst Discussed option for hand surgery referral for possible aspiration/injection May also resolve independently Patient to call back if she decides she wants a referral, but she declines at this time          Return in about 5 months (around 04/17/2018) for mood f/u.   The entirety of the information documented in  the History of Present Illness, Review of Systems and Physical Exam were personally obtained by me. Portions of this information were initially documented by Raquel Sarna Ratchford, CMA and reviewed by me for thoroughness and accuracy.    Virginia Crews, MD, MPH [facility] 11/15/2017 3:52 PM

## 2018-03-04 ENCOUNTER — Ambulatory Visit (INDEPENDENT_AMBULATORY_CARE_PROVIDER_SITE_OTHER): Payer: PPO | Admitting: Family Medicine

## 2018-03-04 ENCOUNTER — Encounter: Payer: Self-pay | Admitting: Family Medicine

## 2018-03-04 VITALS — BP 119/74 | HR 71 | Temp 98.0°F | Wt 121.2 lb

## 2018-03-04 DIAGNOSIS — F419 Anxiety disorder, unspecified: Secondary | ICD-10-CM | POA: Diagnosis not present

## 2018-03-04 DIAGNOSIS — S76012A Strain of muscle, fascia and tendon of left hip, initial encounter: Secondary | ICD-10-CM

## 2018-03-04 NOTE — Progress Notes (Signed)
Patient: Natalie Rosales Female    DOB: [birth year]   72 y.o.   MRN: [id number] Visit Date: 03/04/2018  Today's Provider: Lavon Paganini, MD   Chief Complaint  Patient presents with  . Anxiety   Subjective:    Abdominal Pain  This is a new problem. Episode onset: approximately 1 month ago. The problem occurs constantly. The pain is located in the LLQ. The quality of the pain is aching and sharp. The pain is aggravated by certain positions. The pain is relieved by nothing. She has tried acetaminophen for the symptoms. The treatment provided no relief.    Patient describes this as a sharp pain that is somewhere between her spine and her hip on the left side.  She was seen 5 months ago for a left lower quadrant abdominal pain and diagnosed with constipation and told to take a laxative.  She feels like this got better.  This pain she is currently having has been worse over the last several days.  She states that it hurts worse when she is sitting and when she pushes on it.  She tried gabapentin which did not seem to help.  She took Celebrex which did help.  Tylenol did not help.  A heating pad did seem to help as well.  Anxiety:  Patient presents for a 3 month follow up. Last OV was on 11/15/2017. Patient advised to continue medication. She reports poor compliance with treatment plan. She states symptoms are improved, and she stopped taking medications.   GAD 7 : Generalized Anxiety Score 03/04/2018 11/15/2017 09/27/2017  Nervous, Anxious, on Edge 0 0 1  Control/stop worrying 0 1 2  Worry too much - different things 0 1 2  Trouble relaxing 0 0 1  Restless 0 0 1  Easily annoyed or irritable 1 1 2   Afraid - awful might happen 1 1 1   Total GAD 7 Score 2 4 10   Anxiety Difficulty Not difficult at all Not difficult at all Somewhat difficult    Depression screen [facility] 2/9 11/15/2017 09/27/2017 03/03/2017  Decreased Interest 0 1 1  Down, Depressed, Hopeless 0 1 2  PHQ - 2 Score 0 2 3  Altered  sleeping 1 1 2   Tired, decreased energy 1 2 2   Change in appetite 0 0 0  Feeling bad or failure about yourself  0 0 0  Trouble concentrating 1 1 0  Moving slowly or fidgety/restless 1 0 0  Suicidal thoughts 0 0 0  PHQ-9 Score 4 6 7   Difficult doing work/chores Not difficult at all Not difficult at all Not difficult at all    Allergies  Allergen Reactions  . Peanut-Containing Drug Products Diarrhea    All nuts - diarrhea and fever blisters  . Eggs Or Egg-Derived Products Rash    Diarrhea, fever blisters     Current Outpatient Medications:  .  celecoxib (CELEBREX) 200 MG capsule, TAKE 1 CAPSULE (200 MG TOTAL) BY MOUTH DAILY., Disp: 90 capsule, Rfl: 1 .  gabapentin (NEURONTIN) 300 MG capsule, TAKE 1 CAPSULE BY MOUTH THREE TIMES A DAY, Disp: , Rfl: 11 .  mometasone (NASONEX) 50 MCG/ACT nasal spray, Place 2 sprays into the nose daily. (Patient taking differently: Place 2 sprays into the nose daily as needed. ), Disp: 17 g, Rfl: 5 .  rosuvastatin (CRESTOR) 5 MG tablet, Take 5 mg by mouth 3 (three) times a week. Per Dr. Nehemiah Massed, ok to take every 2-3 days, Disp: , Rfl: 11 .  ALPRAZolam (XANAX) 0.25 MG tablet, Take 1 tablet (0.25 mg total) by mouth at bedtime as needed for anxiety. (Patient not taking: Reported on 03/04/2018), Disp: 30 tablet, Rfl: 1  Review of Systems  Constitutional: Negative.   Respiratory: Negative.   Cardiovascular: Negative.   Gastrointestinal: Positive for abdominal pain.  Psychiatric/Behavioral:       Depression     Social History   Tobacco Use  . Smoking status: Former Research scientist (life sciences)  . Smokeless tobacco: Never Used  . Tobacco comment: quit 2005  Substance Use Topics  . Alcohol use: Yes    Alcohol/week: 7.0 standard drinks    Types: 7 Shots of liquor per week    Comment: one mixed drink at night.   Objective:   BP 119/74 (BP Location: Right Arm, Patient Position: Sitting, Cuff Size: Normal)   Pulse 71   Temp 98 F (36.7 C)   Wt 121 lb 3.2 oz (55 kg)    SpO2 99%   BMI 21.47 kg/m  Vitals:   03/04/18 1555  BP: 119/74  Pulse: 71  Temp: 98 F (36.7 C)  SpO2: 99%  Weight: 121 lb 3.2 oz (55 kg)     Physical Exam  Constitutional: She is oriented to person, place, and time. She appears well-developed and well-nourished. No distress.  HENT:  Head: Normocephalic and atraumatic.  Mouth/Throat: Oropharynx is clear and moist.  Eyes: Conjunctivae are normal. No scleral icterus.  Neck: Neck supple. No thyromegaly present.  Cardiovascular: Normal rate, regular rhythm, normal heart sounds and intact distal pulses.  No murmur heard. Pulmonary/Chest: Effort normal and breath sounds normal. No respiratory distress. She has no wheezes. She has no rales.  Abdominal: Soft. Bowel sounds are normal. She exhibits no distension. There is no tenderness. There is no rebound and no guarding.  Musculoskeletal: She exhibits no edema.  Tenderness to palpation over area of left psoas muscle.  Left hip range of motion is intact without pain.  Hip strength is intact.  Negative straight leg raise bilaterally.  No tenderness to palpation over SI joint or lumbar spine.  Lymphadenopathy:    She has no cervical adenopathy.  Neurological: She is alert and oriented to person, place, and time.  Skin: Skin is warm and dry. Capillary refill takes less than 2 seconds. No rash noted.  Psychiatric: She has a normal mood and affect. Her behavior is normal.  Vitals reviewed.       Assessment & Plan:   Problem List Items Addressed This Visit      Other   Anxiety    Patient still appears anxious today For example, she is worried that her abd pain may be stomach or ovarian cancer and worries about surgical staples pulling on something internally She believes that anxiety is better and does not want to take a medication Will f/u prn       Other Visit Diagnoses    Strain of left psoas muscle, initial encounter    -  Primary    -On exam, patient is tender to palpation  right over her psoas muscle as it crosses out of her pelvis -Reassured her about this not being cancerous - Can resume Celebrex as this was helping -May also use ice or heat if this helps -Given home exercise program for stretching this -Return precautions discussed -Will see if she is doing better when she is seen in about 6 weeks for her physical   Return in about 6 weeks (around 04/15/2018) for AWV/CPE.  The entirety of the information documented in the History of Present Illness, Review of Systems and Physical Exam were personally obtained by me. Portions of this information were initially documented by Tiburcio Pea, CMA and reviewed by me for thoroughness and accuracy.    Virginia Crews, MD, MPH [facility] 03/04/2018 4:41 PM

## 2018-03-04 NOTE — Assessment & Plan Note (Signed)
Patient still appears anxious today For example, she is worried that her abd pain may be stomach or ovarian cancer and worries about surgical staples pulling on something internally She believes that anxiety is better and does not want to take a medication Will f/u prn

## 2018-03-29 DIAGNOSIS — I442 Atrioventricular block, complete: Secondary | ICD-10-CM | POA: Diagnosis not present

## 2018-04-18 ENCOUNTER — Ambulatory Visit: Payer: Self-pay | Admitting: Family Medicine

## 2018-04-25 ENCOUNTER — Other Ambulatory Visit: Payer: Self-pay | Admitting: Family Medicine

## 2018-04-25 DIAGNOSIS — Z1231 Encounter for screening mammogram for malignant neoplasm of breast: Secondary | ICD-10-CM

## 2018-05-09 DIAGNOSIS — I442 Atrioventricular block, complete: Secondary | ICD-10-CM | POA: Diagnosis not present

## 2018-05-09 DIAGNOSIS — R002 Palpitations: Secondary | ICD-10-CM | POA: Diagnosis not present

## 2018-05-09 DIAGNOSIS — E782 Mixed hyperlipidemia: Secondary | ICD-10-CM | POA: Diagnosis not present

## 2018-05-16 ENCOUNTER — Ambulatory Visit
Admission: RE | Admit: 2018-05-16 | Discharge: 2018-05-16 | Disposition: A | Discharge status: Home / Self Care | Payer: PPO | Source: Home / Self Care | Attending: Family Medicine | Admitting: Family Medicine

## 2018-05-16 ENCOUNTER — Ambulatory Visit
Admission: RE | Admit: 2018-05-16 | Discharge: 2018-05-16 | Disposition: A | Discharge status: Home / Self Care | Payer: PPO | Source: Ambulatory Visit | Attending: Family Medicine | Admitting: Family Medicine

## 2018-05-16 ENCOUNTER — Ambulatory Visit (INDEPENDENT_AMBULATORY_CARE_PROVIDER_SITE_OTHER): Payer: PPO

## 2018-05-16 ENCOUNTER — Encounter: Payer: Self-pay | Admitting: Family Medicine

## 2018-05-16 ENCOUNTER — Telehealth: Payer: Self-pay

## 2018-05-16 ENCOUNTER — Ambulatory Visit (INDEPENDENT_AMBULATORY_CARE_PROVIDER_SITE_OTHER): Payer: PPO | Admitting: Family Medicine

## 2018-05-16 VITALS — BP 118/66 | HR 73 | Temp 98.3°F | Resp 16 | Ht 63.0 in | Wt 121.0 lb

## 2018-05-16 VITALS — BP 118/66 | HR 73 | Temp 98.3°F | Ht 63.0 in | Wt 121.0 lb

## 2018-05-16 DIAGNOSIS — M25552 Pain in left hip: Secondary | ICD-10-CM | POA: Insufficient documentation

## 2018-05-16 DIAGNOSIS — R0781 Pleurodynia: Secondary | ICD-10-CM

## 2018-05-16 DIAGNOSIS — Z0000 Encounter for general adult medical examination without abnormal findings: Secondary | ICD-10-CM

## 2018-05-16 DIAGNOSIS — Z853 Personal history of malignant neoplasm of breast: Secondary | ICD-10-CM

## 2018-05-16 DIAGNOSIS — Z1231 Encounter for screening mammogram for malignant neoplasm of breast: Secondary | ICD-10-CM | POA: Insufficient documentation

## 2018-05-16 DIAGNOSIS — M1612 Unilateral primary osteoarthritis, left hip: Secondary | ICD-10-CM | POA: Diagnosis not present

## 2018-05-16 HISTORY — DX: Personal history of irradiation: Z92.3

## 2018-05-16 NOTE — Progress Notes (Signed)
Subjective:   Natalie Rosales is a 73 y.o. female who presents for Medicare Annual (Subsequent) preventive examination.  Review of Systems:  N/A  Cardiac Risk Factors include: advanced age (>51men, >78 women);dyslipidemia     Objective:     Vitals: BP 118/66 (BP Location: Right Arm)   Pulse 73   Temp 98.3 F (36.8 C) (Oral)   Ht 5\' 3"  (1.6 m)   Wt 121 lb (54.9 kg)   BMI 21.43 kg/m   Body mass index is 21.43 kg/m.  Advanced Directives 05/16/2018 06/10/2015  Does Patient Have a Medical Advance Directive? No No  Would patient like information on creating a medical advance directive? No - Patient declined No - patient declined information    Tobacco Social History   Tobacco Use  Smoking Status Former Smoker  . Types: Cigarettes  Smokeless Tobacco Never Used  Tobacco Comment   quit 2005     Counseling given: Not Answered Comment: quit 2005   Clinical Intake:  Pre-visit preparation completed: Yes  Pain : No/denies pain Pain Score: 0-No pain     Nutritional Status: BMI of 19-24  Normal Nutritional Risks: None Diabetes: No  How often do you need to have someone help you when you read instructions, pamphlets, or other written materials from your doctor or pharmacy?: 1 - Never  Interpreter Needed?: No  Information entered by :: [facility], LPN  Past Medical History:  Diagnosis Date  . Allergy   . Anxiety   . Arthritis    back  . Back pain    lower back  . Breast cancer ([place]) 2005    left breast ca, Lumpectomy, f/u with radiation   . Complete heart block ([place])    Pacemaker placed 2005  . Hyperlipidemia   . PONV (postoperative nausea and vomiting)   . Wears contact lenses   . Wears hearing aid    bilateral   Past Surgical History:  Procedure Laterality Date  . ABDOMINAL HYSTERECTOMY    . APPENDECTOMY  1982  . BREAST LUMPECTOMY Left 2005   f/u radiation   . CARDIAC PACEMAKER PLACEMENT  2005  . CHOLECYSTECTOMY  1982  . COLONOSCOPY WITH PROPOFOL  N/A 06/10/2015   Procedure: COLONOSCOPY WITH PROPOFOL;  Surgeon: Lucilla Lame, MD;  Location: [facility];  Service: Endoscopy;  Laterality: N/A;  . HIP FRACTURE SURGERY Right 2012   Three pins    Family History  Problem Relation Age of Onset  . Cancer Mother   . Cancer Father        Lymphoma or Pancreatic Cancer  . Pancreatic cancer Father   . Lymphoma Father   . Heart disease Sister   . Breast cancer Sister   . Bladder Cancer Brother   . Lymphoma Brother   . Breast cancer Cousin    Social History   Socioeconomic History  . Marital status: Married    Spouse name: [name]  . Number of children: 3  . Years of education: College  . Highest education level: Associate degree: academic program  Occupational History  . Occupation: Hair Dresser    Comment: 3 days a week.  Social Needs  . Financial resource strain: Not hard at all  . Food insecurity:    Worry: Never true    Inability: Never true  . Transportation needs:    Medical: No    Non-medical: No  Tobacco Use  . Smoking status: Former Smoker    Types: Cigarettes  . Smokeless tobacco: Never  Used  . Tobacco comment: quit 2005  Substance and Sexual Activity  . Alcohol use: Yes    Alcohol/week: 3.0 standard drinks    Types: 3 Shots of liquor per week    Comment: one mixed drink every other night.  . Drug use: No  . Sexual activity: Not Currently  Lifestyle  . Physical activity:    Days per week: 4 days    Minutes per session: Not on file  . Stress: Not at all  Relationships  . Social connections:    Talks on phone: Patient refused    Gets together: Patient refused    Attends religious service: Patient refused    Active member of club or organization: Patient refused    Attends meetings of clubs or organizations: Patient refused    Relationship status: Patient refused  Other Topics Concern  . Not on file  Social History Narrative  . Not on file    Outpatient Encounter Medications as of 05/16/2018    Medication Sig  . ALPRAZolam (XANAX) 0.25 MG tablet Take 1 tablet (0.25 mg total) by mouth at bedtime as needed for anxiety.  . celecoxib (CELEBREX) 200 MG capsule TAKE 1 CAPSULE (200 MG TOTAL) BY MOUTH DAILY. (Patient taking differently: Take 200 mg by mouth daily. Currently taking EOD)  . gabapentin (NEURONTIN) 300 MG capsule Take 300 mg by mouth. Once a day as needed if cannot sleep.  . mometasone (NASONEX) 50 MCG/ACT nasal spray Place 2 sprays into the nose daily. (Patient taking differently: Place 2 sprays into the nose daily as needed. )  . rosuvastatin (CRESTOR) 5 MG tablet Take 5 mg by mouth 3 (three) times a week. Per Dr. Nehemiah Massed, ok to take every 2-3 days   No facility-administered encounter medications on file as of 05/16/2018.     Activities of Daily Living In your present state of health, do you have any difficulty performing the following activities: 05/16/2018 03/04/2018  Hearing? Tempie Donning  Comment Wears bilateral hearing aids.  -  Vision? Y N  Comment Wears contacts daily.  -  Difficulty concentrating or making decisions? N N  Walking or climbing stairs? N N  Dressing or bathing? N N  Doing errands, shopping? N N  Preparing Food and eating ? N -  Using the Toilet? N -  In the past six months, have you accidently leaked urine? N -  Do you have problems with loss of bowel control? N -  Managing your Medications? N -  Managing your Finances? N -  Housekeeping or managing your Housekeeping? N -  Some recent data might be hidden    Patient Care Team: Virginia Crews, MD as PCP - General (Family Medicine) Corey Skains, MD as Consulting Physician (Cardiology) Ocie Doyne, OD (Optometry)    Assessment:   This is a routine wellness examination for Maunie.  Exercise Activities and Dietary recommendations Current Exercise Habits: Home exercise routine, Type of exercise: treadmill;walking, Time (Minutes): 60, Frequency (Times/Week): 4, Weekly Exercise (Minutes/Week):  240, Intensity: Moderate  Goals    . DIET - INCREASE WATER INTAKE     Recommend to drink at least 6-8 8oz glasses of water per day.       Fall Risk Fall Risk  05/16/2018 03/04/2018 03/03/2017 01/13/2016 11/26/2014  Falls in the past year? 0 0 No No No   FALL RISK PREVENTION PERTAINING TO THE HOME:  Any stairs in or around the home WITH handrails? Yes  Home free of loose throw  rugs in walkways, pet beds, electrical cords, etc? Yes  Adequate lighting in your home to reduce risk of falls? Yes   ASSISTIVE DEVICES UTILIZED TO PREVENT FALLS:  Life alert? No  Use of a cane, walker or w/c? No  Grab bars in the bathroom? No  Shower chair or bench in shower? No  Elevated toilet seat or a handicapped toilet? No    TIMED UP AND GO:  Was the test performed? No .     Depression Screen PHQ 2/9 Scores 05/16/2018 11/15/2017 09/27/2017 03/03/2017  PHQ - 2 Score 0 0 2 3  PHQ- 9 Score - 4 6 7      Cognitive Function: Declined today.         Immunization History  Administered Date(s) Administered  . Pneumococcal Conjugate-13 11/26/2014  . Pneumococcal Polysaccharide-23 01/13/2016  . Tdap 03/03/2017    Qualifies for Shingles Vaccine? Yes . Due for Shingrix. Education has been provided regarding the importance of this vaccine. Pt has been advised to call insurance company to determine out of pocket expense. Advised may also receive vaccine at local pharmacy or Health Dept. Verbalized acceptance and understanding.  Tdap: Up to date  Pneumococcal Vaccine: Up to date  Screening Tests Health Maintenance  Topic Date Due  . MAMMOGRAM  04/27/2019  . DEXA SCAN  04/27/2019  . COLONOSCOPY  06/09/2025  . TETANUS/TDAP  03/04/2027  . Hepatitis C Screening  Completed  . PNA vac Low Risk Adult  Completed    Cancer Screenings:  Colorectal Screening: Completed 06/10/15. Repeat every 10 years.  Mammogram: Completed 06/09/16, scheduled today.   Bone Density: Completed 04/26/17. Results reflect  OSTEOPOROSIS. Repeat every 2 years.   Lung Cancer Screening: (Low Dose CT Chest recommended if Age 67-80 years, 30 pack-year currently smoking OR have quit w/in 15years.) does qualify, however declines order.    Additional Screening:  Hepatitis C Screening: Up to date  Vision Screening: Recommended annual ophthalmology exams for early detection of glaucoma and other disorders of the eye.  Dental Screening: Recommended annual dental exams for proper oral hygiene  Community Resource Referral:  CRR required this visit?  No       Plan:  I have personally reviewed and addressed the Medicare Annual Wellness questionnaire and have noted the following in the patient's chart:  A. Medical and social history B. Use of alcohol, tobacco or illicit drugs  C. Current medications and supplements D. Functional ability and status E.  Nutritional status F.  Physical activity G. Advance directives H. List of other physicians I.  Hospitalizations, surgeries, and ER visits in previous 12 months J.  [place] such as hearing and vision if needed, cognitive and depression L. Referrals and appointments - none  In addition, I have reviewed and discussed with patient certain preventive protocols, quality metrics, and best practice recommendations. A written personalized care plan for preventive services as well as general preventive health recommendations were provided to patient.  See attached scanned questionnaire for additional information.   Signed,  Fabio Neighbors, LPN Nurse Health Advisor   Nurse Recommendations: None.

## 2018-05-16 NOTE — Telephone Encounter (Signed)
-----   Message from Virginia Crews, MD sent at 05/16/2018  1:56 PM EST ----- Normal mammogram. Repeat in 1 yr

## 2018-05-16 NOTE — Patient Instructions (Signed)
Preventive Care 73 Years and Older, Female Preventive care refers to lifestyle choices and visits with your health care provider that can promote health and wellness. What does preventive care include?  A yearly physical exam. This is also called an annual well check.  Dental exams once or twice a year.  Routine eye exams. Ask your health care provider how often you should have your eyes checked.  Personal lifestyle choices, including: ? Daily care of your teeth and gums. ? Regular physical activity. ? Eating a healthy diet. ? Avoiding tobacco and drug use. ? Limiting alcohol use. ? Practicing safe sex. ? Taking low-dose aspirin every day. ? Taking vitamin and mineral supplements as recommended by your health care provider. What happens during an annual well check? The services and screenings done by your health care provider during your annual well check will depend on your age, overall health, lifestyle risk factors, and family history of disease. Counseling Your health care provider may ask you questions about your:  Alcohol use.  Tobacco use.  Drug use.  Emotional well-being.  Home and relationship well-being.  Sexual activity.  Eating habits.  History of falls.  Memory and ability to understand (cognition).  Work and work Statistician.  Reproductive health.  Screening You may have the following tests or measurements:  Height, weight, and BMI.  Blood pressure.  Lipid and cholesterol levels. These may be checked every 5 years, or more frequently if you are over 30 years old.  Skin check.  Lung cancer screening. You may have this screening every year starting at age 27 if you have a 30-pack-year history of smoking and currently smoke or have quit within the past 15 years.  Colorectal cancer screening. All adults should have this screening starting at age 33 and continuing until age 46. You will have tests every 1-10 years, depending on your results and the  type of screening test. People at increased risk should start screening at an earlier age. Screening tests may include: ? Guaiac-based fecal occult blood testing. ? Fecal immunochemical test (FIT). ? Stool DNA test. ? Virtual colonoscopy. ? Sigmoidoscopy. During this test, a flexible tube with a tiny camera (sigmoidoscope) is used to examine your rectum and lower colon. The sigmoidoscope is inserted through your anus into your rectum and lower colon. ? Colonoscopy. During this test, a long, thin, flexible tube with a tiny camera (colonoscope) is used to examine your entire colon and rectum.  Hepatitis C blood test.  Hepatitis B blood test.  Sexually transmitted disease (STD) testing.  Diabetes screening. This is done by checking your blood sugar (glucose) after you have not eaten for a while (fasting). You may have this done every 1-3 years.  Bone density scan. This is done to screen for osteoporosis. You may have this done starting at age 37.  Mammogram. This may be done every 1-2 years. Talk to your health care provider about how often you should have regular mammograms. Talk with your health care provider about your test results, treatment options, and if necessary, the need for more tests. Vaccines Your health care provider may recommend certain vaccines, such as:  Influenza vaccine. This is recommended every year.  Tetanus, diphtheria, and acellular pertussis (Tdap, Td) vaccine. You may need a Td booster every 10 years.  Varicella vaccine. You may need this if you have not been vaccinated.  Zoster vaccine. You may need this after age 38.  Measles, mumps, and rubella (MMR) vaccine. You may need at least  one dose of MMR if you were born in 1957 or later. You may also need a second dose.  Pneumococcal 13-valent conjugate (PCV13) vaccine. One dose is recommended after age 24.  Pneumococcal polysaccharide (PPSV23) vaccine. One dose is recommended after age 24.  Meningococcal  vaccine. You may need this if you have certain conditions.  Hepatitis A vaccine. You may need this if you have certain conditions or if you travel or work in places where you may be exposed to hepatitis A.  Hepatitis B vaccine. You may need this if you have certain conditions or if you travel or work in places where you may be exposed to hepatitis B.  Haemophilus influenzae type b (Hib) vaccine. You may need this if you have certain conditions. Talk to your health care provider about which screenings and vaccines you need and how often you need them. This information is not intended to replace advice given to you by your health care provider. Make sure you discuss any questions you have with your health care provider. Document Released: 05/10/2015 Document Revised: 06/03/2017 Document Reviewed: 02/12/2015 Elsevier Interactive Patient Education  2019 [employer].

## 2018-05-16 NOTE — Progress Notes (Signed)
Patient: Natalie Rosales, Female    DOB: 1945-11-21, 73 y.o.   MRN: [id number] Visit Date: 05/16/2018  Today's Provider: Lavon Paganini, MD   Chief Complaint  Patient presents with  . Annual Exam   Subjective:    Annual wellness visit Natalie Rosales is a 73 y.o. female. She feels fairly well. She reports exercising 4 days a week. She reports she is sleeping fairly well. 03/03/17 Last CPE 06/10/15 Colonoscopy-non-bleeding internal hemorrhoids. Recheck in 10 years. 04/26/17 Mammogram-BI-RADS 1 - has one scheduled for later today 04/26/17 BMD-Osteoporosis - repeat in 2 yrs  Lab Results  Component Value Date   WBC 6.7 09/27/2017   HGB 12.5 09/27/2017   HCT 39.0 09/27/2017   PLT 278 09/27/2017   GLUCOSE 93 09/27/2017   CHOL 147 09/27/2017   TRIG 83 09/27/2017   HDL 59 09/27/2017   LDLCALC 71 09/27/2017   ALT 10 09/27/2017   AST 18 09/27/2017   NA 141 09/27/2017   K 4.7 09/27/2017   CL 103 09/27/2017   CREATININE 0.94 09/27/2017   BUN 17 09/27/2017   CO2 24 09/27/2017   TSH 1.960 09/27/2017   INR 0.8 05/29/2014   ----------------------------------------------------------- Patient reports that her left side is still bothering her. Patient reports that she has taken Celebrex as advised by Dr. Jacinto Reap. Patient reports no imporvement with pain. Patient was seen on 03/04/2018.  She has also noticed TTP along L lower ribs.    Review of Systems  Constitutional: Negative.   HENT: Positive for hearing loss, postnasal drip, sneezing and tinnitus.   Eyes: Negative.   Respiratory: Negative.   Cardiovascular: Negative.   Gastrointestinal: Positive for constipation and diarrhea.  Endocrine: Positive for cold intolerance.  Genitourinary: Negative.   Musculoskeletal: Positive for back pain and neck stiffness.  Skin: Negative.   Allergic/Immunologic: Positive for environmental allergies.  Neurological: Negative.   Hematological: Negative.   Psychiatric/Behavioral: Negative.      Social History   Socioeconomic History  . Marital status: Married    Spouse name: [name]  . Number of children: 3  . Years of education: College  . Highest education level: Associate degree: academic program  Occupational History  . Occupation: Hair Dresser    Comment: 3 days a week.  Social Needs  . Financial resource strain: Not hard at all  . Food insecurity:    Worry: Never true    Inability: Never true  . Transportation needs:    Medical: No    Non-medical: No  Tobacco Use  . Smoking status: Former Smoker    Types: Cigarettes  . Smokeless tobacco: Never Used  . Tobacco comment: quit 2005  Substance and Sexual Activity  . Alcohol use: Yes    Alcohol/week: 3.0 standard drinks    Types: 3 Shots of liquor per week    Comment: one mixed drink every other night.  . Drug use: No  . Sexual activity: Not Currently  Lifestyle  . Physical activity:    Days per week: 4 days    Minutes per session: Not on file  . Stress: Not at all  Relationships  . Social connections:    Talks on phone: Patient refused    Gets together: Patient refused    Attends religious service: Patient refused    Active member of club or organization: Patient refused    Attends meetings of clubs or organizations: Patient refused    Relationship status: Patient refused  . Intimate partner  violence:    Fear of current or ex partner: Patient refused    Emotionally abused: Patient refused    Physically abused: Patient refused    Forced sexual activity: Patient refused  Other Topics Concern  . Not on file  Social History Narrative  . Not on file    Past Medical History:  Diagnosis Date  . Allergy   . Anxiety   . Arthritis    back  . Back pain    lower back  . Breast cancer ([place]) 2005    left breast ca, Lumpectomy, f/u with radiation   . Complete heart block ([place])    Pacemaker placed 2005  . Hyperlipidemia   . PONV (postoperative nausea and vomiting)   . Wears contact lenses   .  Wears hearing aid    bilateral     Patient Active Problem List   Diagnosis Date Noted  . Ganglion cyst of finger of left hand 11/15/2017  . Adjustment disorder with mixed anxiety and depressed mood 09/27/2017  . Constipation 09/27/2017  . PSVT (paroxysmal supraventricular tachycardia) ([place]) 02/11/2017  . Special screening for malignant neoplasm of intestine   . Low back pain 05/28/2015  . Breast cancer ([place]) 11/26/2014  . Acquired complete AV block ([place]) 11/26/2014  . Combined fat and carbohydrate induced hyperlipemia 11/26/2014  . Arthritis, degenerative 11/26/2014  . Osteopenia 11/26/2014  . Artificial cardiac pacemaker 11/26/2014  . Anxiety 09/20/2014  . H/O fracture of hip 09/20/2014  . H/O malignant neoplasm of breast 09/20/2014  . Allergic rhinitis, seasonal 09/20/2014  . Gastro-esophageal reflux disease without esophagitis 01/31/2014  . Awareness of heartbeats 01/30/2014  . Closed fracture of intracapsular section of femur ([place]) 11/17/2011    Past Surgical History:  Procedure Laterality Date  . ABDOMINAL HYSTERECTOMY    . APPENDECTOMY  1982  . BREAST LUMPECTOMY Left 2005   f/u radiation   . CARDIAC PACEMAKER PLACEMENT  2005  . CHOLECYSTECTOMY  1982  . COLONOSCOPY WITH PROPOFOL N/A 06/10/2015   Procedure: COLONOSCOPY WITH PROPOFOL;  Surgeon: Lucilla Lame, MD;  Location: [facility];  Service: Endoscopy;  Laterality: N/A;  . HIP FRACTURE SURGERY Right 2012   Three pins     Her family history includes Bladder Cancer in her brother; Breast cancer in her cousin and sister; Cancer in her father and mother; Heart disease in her sister; Lymphoma in her brother and father; Pancreatic cancer in her father.      Current Outpatient Medications:  .  ALPRAZolam (XANAX) 0.25 MG tablet, Take 1 tablet (0.25 mg total) by mouth at bedtime as needed for anxiety., Disp: 30 tablet, Rfl: 1 .  celecoxib (CELEBREX) 200 MG capsule, TAKE 1 CAPSULE (200 MG TOTAL) BY MOUTH DAILY.  (Patient taking differently: Take 200 mg by mouth daily. Currently taking EOD), Disp: 90 capsule, Rfl: 1 .  gabapentin (NEURONTIN) 300 MG capsule, Take 300 mg by mouth. Once a day as needed if cannot sleep., Disp: , Rfl: 11 .  mometasone (NASONEX) 50 MCG/ACT nasal spray, Place 2 sprays into the nose daily. (Patient taking differently: Place 2 sprays into the nose daily as needed. ), Disp: 17 g, Rfl: 5 .  rosuvastatin (CRESTOR) 5 MG tablet, Take 5 mg by mouth 3 (three) times a week. Per Dr. Nehemiah Massed, ok to take every 2-3 days, Disp: , Rfl: 11  Patient Care Team: Virginia Crews, MD as PCP - General (Family Medicine) Corey Skains, MD as Consulting Physician (Cardiology) Ocie Doyne, [place] (Optometry)  Objective:   Vitals: BP 118/66 (BP Location: Left Arm, Patient Position: Sitting, Cuff Size: Normal)   Pulse 73   Temp 98.3 F (36.8 C) (Oral)   Resp 16   Ht 5\' 3"  (1.6 m)   Wt 121 lb (54.9 kg)   BMI 21.43 kg/m   Physical Exam Vitals signs reviewed.  Constitutional:      General: She is not in acute distress.    Appearance: Normal appearance. She is well-developed. She is not diaphoretic.  HENT:     Head: Normocephalic and atraumatic.     Right Ear: External ear normal.     Left Ear: External ear normal.     Nose: Nose normal.     Mouth/Throat:     Mouth: Mucous membranes are moist.     Pharynx: Oropharynx is clear. No oropharyngeal exudate.  Eyes:     General: No scleral icterus.    Conjunctiva/sclera: Conjunctivae normal.     Pupils: Pupils are equal, round, and reactive to light.  Neck:     Musculoskeletal: Neck supple.     Thyroid: No thyromegaly.  Cardiovascular:     Rate and Rhythm: Normal rate and regular rhythm.     Pulses: Normal pulses.     Heart sounds: Normal heart sounds. No murmur.  Pulmonary:     Effort: Pulmonary effort is normal. No respiratory distress.     Breath sounds: Normal breath sounds. No wheezing, rhonchi or rales.  Abdominal:      General: Bowel sounds are normal. There is no distension.     Palpations: Abdomen is soft.     Tenderness: There is no abdominal tenderness. There is no guarding or rebound.  Musculoskeletal:        General: No deformity.     Right lower leg: No edema.     Left lower leg: No edema.     Comments: No TTP along L hip or psoas muscle.  Hip ROM intact.  TTP along Left lower ribs  Lymphadenopathy:     Cervical: No cervical adenopathy.  Skin:    General: Skin is warm and dry.     Capillary Refill: Capillary refill takes less than 2 seconds.     Findings: No rash.  Neurological:     Mental Status: She is alert and oriented to person, place, and time.  Psychiatric:        Mood and Affect: Mood normal.        Behavior: Behavior normal.        Thought Content: Thought content normal.     Activities of Daily Living In your present state of health, do you have any difficulty performing the following activities: 05/16/2018 03/04/2018  Hearing? Tempie Donning  Comment Wears bilateral hearing aids.  -  Vision? Y N  Comment Wears contacts daily.  -  Difficulty concentrating or making decisions? N N  Walking or climbing stairs? N N  Dressing or bathing? N N  Doing errands, shopping? N N  Preparing Food and eating ? N -  Using the Toilet? N -  In the past six months, have you accidently leaked urine? N -  Do you have problems with loss of bowel control? N -  Managing your Medications? N -  Managing your Finances? N -  Housekeeping or managing your Housekeeping? N -  Some recent data might be hidden    Fall Risk Assessment Fall Risk  05/16/2018 03/04/2018 03/03/2017 01/13/2016 11/26/2014  Falls in the past year? 0  0 No No No     Depression Screen PHQ 2/9 Scores 05/16/2018 11/15/2017 09/27/2017 03/03/2017  PHQ - 2 Score 0 0 2 3  PHQ- 9 Score - 4 6 7     No flowsheet data found.   Assessment & Plan:     Annual Wellness Visit  Reviewed patient's Family Medical History Reviewed and updated list of  patient's medical providers Assessment of cognitive impairment was done Assessed patient's functional ability Established a written schedule for health screening [place] Completed and Reviewed  Exercise Activities and Dietary recommendations Goals    . DIET - INCREASE WATER INTAKE     Recommend to drink at least 6-8 8oz glasses of water per day.       Immunization History  Administered Date(s) Administered  . Pneumococcal Conjugate-13 11/26/2014  . Pneumococcal Polysaccharide-23 01/13/2016  . Tdap 03/03/2017    Health Maintenance  Topic Date Due  . MAMMOGRAM  04/27/2019  . DEXA SCAN  04/27/2019  . COLONOSCOPY  06/09/2025  . TETANUS/TDAP  03/04/2027  . Hepatitis C Screening  Completed  . PNA vac Low Risk Adult  Completed     Discussed health benefits of physical activity, and encouraged her to engage in regular exercise appropriate for her age and condition.    ------------------------------------------------------------------------------------------------------------  1. Encounter for annual physical exam - see above - UTD on screenings  2. H/O malignant neoplasm of breast 3. Rib pain 4. Pain of left hip joint - given h/o breast cancer and bony tenderness, will obtain XRays to ensure no bony lesions - thought that hip pain was likely a psoas muscle strain previously, but she is still having some pain when laying down - if no bony lesions, we will watchfully wait and see if this improves - could consider PT   Return in about 5 months (around 10/15/2018) for chronic disease f/u - first week of June (after 6/3).   The entirety of the information documented in the History of Present Illness, Review of Systems and Physical Exam were personally obtained by me. Portions of this information were initially documented by [facility], CMA and reviewed by me for thoroughness and accuracy.    Virginia Crews, MD, MPH [facility] 05/16/2018 12:57 PM

## 2018-05-16 NOTE — Telephone Encounter (Signed)
Patient was advised via voicemail (per DRP patient completed 05/16/2018).

## 2018-05-16 NOTE — Patient Instructions (Addendum)
Natalie Rosales , Thank you for taking time to come for your Medicare Wellness Visit. I appreciate your ongoing commitment to your health goals. Please review the following plan we discussed and let me know if I can assist you in the future.   Screening recommendations/referrals: Colonoscopy: Up to date, due 05/2025 Mammogram: Scheduled today Bone Density: Up to date, due 03/2019 Recommended yearly ophthalmology/optometry visit for glaucoma screening and checkup Recommended yearly dental visit for hygiene and checkup  Vaccinations: Influenza vaccine: N/A Pneumococcal vaccine: Completed series Tdap vaccine: Up to date, due 02/2027 Shingles vaccine: Pt declines today.     Advanced directives: Advance directive discussed with you today. Even though you declined this today please call our office should you change your mind and we can give you the proper paperwork for you to fill out.  Conditions/risks identified: Recommend to drink at least 6-8 8oz glasses of water per day.  Next appointment: 11:00 AM today with Dr Brita Romp. Pt declined scheduling the AWV for 2020 at this time.    Preventive Care 75 Years and Older, Female Preventive care refers to lifestyle choices and visits with your health care provider that can promote health and wellness. What does preventive care include?  A yearly physical exam. This is also called an annual well check.  Dental exams once or twice a year.  Routine eye exams. Ask your health care provider how often you should have your eyes checked.  Personal lifestyle choices, including:  Daily care of your teeth and gums.  Regular physical activity.  Eating a healthy diet.  Avoiding tobacco and drug use.  Limiting alcohol use.  Practicing safe sex.  Taking low-dose aspirin every day.  Taking vitamin and mineral supplements as recommended by your health care provider. What happens during an annual well check? The services and screenings done by your  health care provider during your annual well check will depend on your age, overall health, lifestyle risk factors, and family history of disease. Counseling  Your health care provider may ask you questions about your:  Alcohol use.  Tobacco use.  Drug use.  Emotional well-being.  Home and relationship well-being.  Sexual activity.  Eating habits.  History of falls.  Memory and ability to understand (cognition).  Work and work Statistician.  Reproductive health. Screening  You may have the following tests or measurements:  Height, weight, and BMI.  Blood pressure.  Lipid and cholesterol levels. These may be checked every 5 years, or more frequently if you are over 42 years old.  Skin check.  Lung cancer screening. You may have this screening every year starting at age 36 if you have a 30-pack-year history of smoking and currently smoke or have quit within the past 15 years.  Fecal occult blood test (FOBT) of the stool. You may have this test every year starting at age 12.  Flexible sigmoidoscopy or colonoscopy. You may have a sigmoidoscopy every 5 years or a colonoscopy every 10 years starting at age 44.  Hepatitis C blood test.  Hepatitis B blood test.  Sexually transmitted disease (STD) testing.  Diabetes screening. This is done by checking your blood sugar (glucose) after you have not eaten for a while (fasting). You may have this done every 1-3 years.  Bone density scan. This is done to screen for osteoporosis. You may have this done starting at age 22.  Mammogram. This may be done every 1-2 years. Talk to your health care provider about how often you should have  regular mammograms. Talk with your health care provider about your test results, treatment options, and if necessary, the need for more tests. Vaccines  Your health care provider may recommend certain vaccines, such as:  Influenza vaccine. This is recommended every year.  Tetanus, diphtheria, and  acellular pertussis (Tdap, Td) vaccine. You may need a Td booster every 10 years.  Zoster vaccine. You may need this after age 75.  Pneumococcal 13-valent conjugate (PCV13) vaccine. One dose is recommended after age 12.  Pneumococcal polysaccharide (PPSV23) vaccine. One dose is recommended after age 50. Talk to your health care provider about which screenings and vaccines you need and how often you need them. This information is not intended to replace advice given to you by your health care provider. Make sure you discuss any questions you have with your health care provider. Document Released: 05/10/2015 Document Revised: 01/01/2016 Document Reviewed: 02/12/2015 Elsevier Interactive Patient Education  2017 [place] Prevention in the Home Falls can cause injuries. They can happen to people of all ages. There are many things you can do to make your home safe and to help prevent falls. What can I do on the outside of my home?  Regularly fix the edges of walkways and driveways and fix any cracks.  Remove anything that might make you trip as you walk through a door, such as a raised step or threshold.  Trim any bushes or trees on the path to your home.  Use bright outdoor lighting.  Clear any walking paths of anything that might make someone trip, such as rocks or tools.  Regularly check to see if handrails are loose or broken. Make sure that both sides of any steps have handrails.  Any raised decks and porches should have guardrails on the edges.  Have any leaves, snow, or ice cleared regularly.  Use sand or salt on walking paths during winter.  Clean up any spills in your garage right away. This includes oil or grease spills. What can I do in the bathroom?  Use night lights.  Install grab bars by the toilet and in the tub and shower. Do not use towel bars as grab bars.  Use non-skid mats or decals in the tub or shower.  If you need to sit down in the shower, use  a plastic, non-slip stool.  Keep the floor dry. Clean up any water that spills on the floor as soon as it happens.  Remove soap buildup in the tub or shower regularly.  Attach bath mats securely with double-sided non-slip rug tape.  Do not have throw rugs and other things on the floor that can make you trip. What can I do in the bedroom?  Use night lights.  Make sure that you have a light by your bed that is easy to reach.  Do not use any sheets or blankets that are too big for your bed. They should not hang down onto the floor.  Have a firm chair that has side arms. You can use this for support while you get dressed.  Do not have throw rugs and other things on the floor that can make you trip. What can I do in the kitchen?  Clean up any spills right away.  Avoid walking on wet floors.  Keep items that you use a lot in easy-to-reach places.  If you need to reach something above you, use a strong step stool that has a grab bar.  Keep electrical cords out of the  way.  Do not use floor polish or wax that makes floors slippery. If you must use wax, use non-skid floor wax.  Do not have throw rugs and other things on the floor that can make you trip. What can I do with my stairs?  Do not leave any items on the stairs.  Make sure that there are handrails on both sides of the stairs and use them. Fix handrails that are broken or loose. Make sure that handrails are as long as the stairways.  Check any carpeting to make sure that it is firmly attached to the stairs. Fix any carpet that is loose or worn.  Avoid having throw rugs at the top or bottom of the stairs. If you do have throw rugs, attach them to the floor with carpet tape.  Make sure that you have a light switch at the top of the stairs and the bottom of the stairs. If you do not have them, ask someone to add them for you. What else can I do to help prevent falls?  Wear shoes that:  Do not have high heels.  Have  rubber bottoms.  Are comfortable and fit you well.  Are closed at the toe. Do not wear sandals.  If you use a stepladder:  Make sure that it is fully opened. Do not climb a closed stepladder.  Make sure that both sides of the stepladder are locked into place.  Ask someone to hold it for you, if possible.  Clearly mark and make sure that you can see:  Any grab bars or handrails.  First and last steps.  Where the edge of each step is.  Use tools that help you move around (mobility aids) if they are needed. These include:  Canes.  Walkers.  Scooters.  Crutches.  Turn on the lights when you go into a dark area. Replace any light bulbs as soon as they burn out.  Set up your furniture so you have a clear path. Avoid moving your furniture around.  If any of your floors are uneven, fix them.  If there are any pets around you, be aware of where they are.  Review your medicines with your doctor. Some medicines can make you feel dizzy. This can increase your chance of falling. Ask your doctor what other things that you can do to help prevent falls. This information is not intended to replace advice given to you by your health care provider. Make sure you discuss any questions you have with your health care provider. Document Released: 02/07/2009 Document Revised: 09/19/2015 Document Reviewed: 05/18/2014 Elsevier Interactive Patient Education  2017 [employer].

## 2018-05-17 ENCOUNTER — Telehealth: Payer: Self-pay | Admitting: *Deleted

## 2018-05-17 NOTE — Telephone Encounter (Signed)
Please call pt back - 2nd or 3rd time she has called back.  Thanks, [employer]

## 2018-05-17 NOTE — Telephone Encounter (Signed)
LMOVM for pt to return call 

## 2018-05-17 NOTE — Telephone Encounter (Signed)
-----   Message from Virginia Crews, MD sent at 05/16/2018  3:43 PM EST ----- Xrays without any signs of fracture or bony lesions.

## 2018-05-17 NOTE — Telephone Encounter (Signed)
Pt returned missed call.  Pt couldn't get to phone in time.  Please call pt back.  Thanks, [employer]

## 2018-05-17 NOTE — Telephone Encounter (Signed)
Patient was advised.  

## 2018-06-08 ENCOUNTER — Telehealth: Payer: Self-pay | Admitting: Family Medicine

## 2018-06-08 MED ORDER — MELOXICAM 15 MG PO TABS: 15.0000 mg | ORAL_TABLET | Freq: Every day | ORAL | 1 refills | Status: DC | PRN

## 2018-06-08 NOTE — Telephone Encounter (Signed)
Can try Mobic 15mg  daily prn for pain.  Do not take this with Celebrex or other NSAIDs. Ok to send Rx #30 r1

## 2018-06-08 NOTE — Telephone Encounter (Signed)
Left back side is hurting.  Pt has discussed this with Dr. Jacinto Reap. Asking if something could be called in so she can continue working.  Please call into:  [id number] - [place], [place] [phone] (Phone) [phone] (Fax)    Please call pt back to let her know.  Thanks, [employer]

## 2018-06-08 NOTE — Telephone Encounter (Signed)
Patient advised. RX sent to [facility].  

## 2018-06-29 DIAGNOSIS — L570 Actinic keratosis: Secondary | ICD-10-CM | POA: Diagnosis not present

## 2018-06-29 DIAGNOSIS — L578 Other skin changes due to chronic exposure to nonionizing radiation: Secondary | ICD-10-CM | POA: Diagnosis not present

## 2018-06-29 DIAGNOSIS — L720 Epidermal cyst: Secondary | ICD-10-CM | POA: Diagnosis not present

## 2018-06-29 DIAGNOSIS — M71342 Other bursal cyst, left hand: Secondary | ICD-10-CM | POA: Diagnosis not present

## 2018-06-29 DIAGNOSIS — Z872 Personal history of diseases of the skin and subcutaneous tissue: Secondary | ICD-10-CM | POA: Diagnosis not present

## 2018-06-29 DIAGNOSIS — L821 Other seborrheic keratosis: Secondary | ICD-10-CM | POA: Diagnosis not present

## 2018-09-27 ENCOUNTER — Telehealth: Payer: Self-pay | Admitting: *Deleted

## 2018-09-27 NOTE — Telephone Encounter (Signed)
Error

## 2018-10-03 ENCOUNTER — Ambulatory Visit (INDEPENDENT_AMBULATORY_CARE_PROVIDER_SITE_OTHER): Payer: PPO | Admitting: Family Medicine

## 2018-10-03 ENCOUNTER — Encounter: Payer: Self-pay | Admitting: Family Medicine

## 2018-10-03 VITALS — BP 118/78 | HR 69 | Wt 121.0 lb

## 2018-10-03 DIAGNOSIS — E782 Mixed hyperlipidemia: Secondary | ICD-10-CM

## 2018-10-03 DIAGNOSIS — R1032 Left lower quadrant pain: Secondary | ICD-10-CM | POA: Diagnosis not present

## 2018-10-03 DIAGNOSIS — Z1329 Encounter for screening for other suspected endocrine disorder: Secondary | ICD-10-CM

## 2018-10-03 DIAGNOSIS — I442 Atrioventricular block, complete: Secondary | ICD-10-CM | POA: Diagnosis not present

## 2018-10-03 DIAGNOSIS — F419 Anxiety disorder, unspecified: Secondary | ICD-10-CM

## 2018-10-03 MED ORDER — ALPRAZOLAM 0.25 MG PO TABS: 0.2500 mg | ORAL_TABLET | Freq: Every evening | ORAL | 0 refills | Status: DC | PRN

## 2018-10-03 NOTE — Patient Instructions (Signed)

## 2018-10-03 NOTE — Assessment & Plan Note (Signed)
Continue Crestor 2-3 times weekly Recheck FLP and CMP

## 2018-10-03 NOTE — Progress Notes (Signed)
Patient: Natalie Rosales Female    DOB: Jan 23, 1946   73 y.o.   MRN: [id number] Visit Date: 10/03/2018  Today's Provider: Lavon Paganini, MD   Chief Complaint  Patient presents with  . Anxiety   Subjective:    I, Porsha McClurkin CMA, am acting as a scribe for Lavon Paganini, MD.   Virtual Visit via Telephone Note  I connected with Milana Huntsman on 10/03/18 at  9:40 AM EDT by telephone and verified that I am speaking with the correct person using two identifiers.   Patient location: home Provider location: [place] involved in the visit: patient, provider   I discussed the limitations, risks, security and privacy concerns of performing an evaluation and management service by telephone and the availability of in person appointments. I also discussed with the patient that there may be a patient responsible charge related to this service. The patient expressed understanding and agreed to proceed.  HPI Anxiety Patient presents today for Anxiety follow-up. Patient is currently taking Xanax. She reports no side effects from the medication and good compliance with treatment.  Her L side continues to bother her.  It is along hip bone on LLQ.  Constant and achy.  She is s/p total hysterectomy.  Celebrex and gabapentin help some.  Cannot lay on back to sleep at night.  Thought previously to be Psoas muscle strain.  Depression screen [facility] 2/9 10/03/2018 05/16/2018 11/15/2017 09/27/2017 03/03/2017  Decreased Interest 0 0 0 1 1  Down, Depressed, Hopeless 0 0 0 1 2  PHQ - 2 Score 0 0 0 2 3  Altered sleeping 1 - 1 1 2   Tired, decreased energy 1 - 1 2 2   Change in appetite 0 - 0 0 0  Feeling bad or failure about yourself  0 - 0 0 0  Trouble concentrating 1 - 1 1 0  Moving slowly or fidgety/restless 0 - 1 0 0  Suicidal thoughts 0 - 0 0 0  PHQ-9 Score 3 - 4 6 7   Difficult doing work/chores Not difficult at all - Not difficult at all Not difficult at all Not  difficult at all   GAD 7 : Generalized Anxiety Score 10/03/2018 03/04/2018 11/15/2017 09/27/2017  Nervous, Anxious, on Edge 1 0 0 1  Control/stop worrying 1 0 1 2  Worry too much - different things 1 0 1 2  Trouble relaxing 0 0 0 1  Restless 1 0 0 1  Easily annoyed or irritable 1 1 1 2   Afraid - awful might happen 1 1 1 1   Total GAD 7 Score 6 2 4 10   Anxiety Difficulty Not difficult at all Not difficult at all Not difficult at all Somewhat difficult    Allergies  Allergen Reactions  . Peanut-Containing Drug Products Diarrhea    All nuts - diarrhea and fever blisters  . Eggs Or Egg-Derived Products Rash    Diarrhea, fever blisters     Current Outpatient Medications:  .  ALPRAZolam (XANAX) 0.25 MG tablet, Take 1 tablet (0.25 mg total) by mouth at bedtime as needed for anxiety., Disp: 30 tablet, Rfl: 1 .  celecoxib (CELEBREX) 200 MG capsule, TAKE 1 CAPSULE (200 MG TOTAL) BY MOUTH DAILY. (Patient taking differently: Take 200 mg by mouth daily. Currently taking EOD), Disp: 90 capsule, Rfl: 1 .  gabapentin (NEURONTIN) 300 MG capsule, Take 300 mg by mouth. Once a day as needed if cannot sleep., Disp: , Rfl: 11 .  mometasone (NASONEX) 50 MCG/ACT nasal spray, Place 2 sprays into the nose daily. (Patient taking differently: Place 2 sprays into the nose daily as needed. ), Disp: 17 g, Rfl: 5 .  rosuvastatin (CRESTOR) 5 MG tablet, Take 5 mg by mouth 3 (three) times a week. Per Dr. Nehemiah Massed, ok to take every 2-3 days, Disp: , Rfl: 11 .  meloxicam (MOBIC) 15 MG tablet, Take 1 tablet (15 mg total) by mouth daily as needed for pain., Disp: 30 tablet, Rfl: 1 .  tiZANidine (ZANAFLEX) 4 MG tablet, Take 2-4 mg by mouth at bedtime as needed., Disp: , Rfl:   Review of Systems  Constitutional: Negative.   Respiratory: Negative.   Genitourinary: Negative.   Hematological: Negative.   Psychiatric/Behavioral: Negative.     Social History   Tobacco Use  . Smoking status: Former Smoker    Types: Cigarettes   . Smokeless tobacco: Never Used  . Tobacco comment: quit 2005  Substance Use Topics  . Alcohol use: Yes    Alcohol/week: 3.0 standard drinks    Types: 3 Shots of liquor per week    Comment: one mixed drink every other night.      Objective:   BP 118/78 (BP Location: Right Arm, Patient Position: Sitting, Cuff Size: Normal)   Pulse 69   Wt 121 lb (54.9 kg)   BMI 21.43 kg/m  Vitals:   10/03/18 0913  BP: 118/78  Pulse: 69  Weight: 121 lb (54.9 kg)     Physical Exam      Assessment & Plan    I discussed the assessment and treatment plan with the patient. The patient was provided an opportunity to ask questions and all were answered. The patient agreed with the plan and demonstrated an understanding of the instructions.   The patient was advised to call back or seek an in-person evaluation if the symptoms worsen or if the condition fails to improve as anticipated.   Problem List Items Addressed This Visit      Cardiovascular and Mediastinum   Acquired complete AV block ([place])    Followed by Cardiology Pacemaker in place        Other   Anxiety - Primary    Chronic and well controlled Denies most anxiety symptoms Can continue to use Xanax sparingly - last refill 1 yr ago Refill given today      Relevant Medications   ALPRAZolam (XANAX) 0.25 MG tablet   Other Relevant Orders   CBC w/Diff/Platelet   TSH   Comprehensive metabolic panel   Combined fat and carbohydrate induced hyperlipemia    Continue Crestor 2-3 times weekly Recheck FLP and CMP      Relevant Orders   Lipid panel   Comprehensive metabolic panel   LLQ pain    Ongoing for ~6 months Benign abd exam in the past No red flags today Have suspected constipation and psoas muscle strain in the past S/p total hysterectomy with BSO If persists, consider CT abd/pelvis HEP for hip flexors given      Relevant Orders   CBC w/Diff/Platelet   Comprehensive metabolic panel    Other Visit Diagnoses     Screening for thyroid disorder       Relevant Orders   TSH       Return in about 7 months (around 05/05/2019) for AWV/CPE after 05/17/19.   The entirety of the information documented in the History of Present Illness, Review of Systems and Physical Exam were personally obtained by me.  Portions of this information were initially documented by [facility], CMA and reviewed by me for thoroughness and accuracy.    Roopa Graver, Dionne Bucy, MD MPH [facility]

## 2018-10-03 NOTE — Assessment & Plan Note (Signed)
Followed by Cardiology Pacemaker in place

## 2018-10-03 NOTE — Assessment & Plan Note (Signed)
Ongoing for ~6 months Benign abd exam in the past No red flags today Have suspected constipation and psoas muscle strain in the past S/p total hysterectomy with BSO If persists, consider CT abd/pelvis HEP for hip flexors given

## 2018-10-03 NOTE — Assessment & Plan Note (Signed)
Chronic and well controlled Denies most anxiety symptoms Can continue to use Xanax sparingly - last refill 1 yr ago Refill given today

## 2018-10-04 LAB — COMPREHENSIVE METABOLIC PANEL
ALT: 15 IU/L (ref 0–32)
AST: 25 IU/L (ref 0–40)
Albumin/Globulin Ratio: 2.3 — ABNORMAL HIGH (ref 1.2–2.2)
Albumin: 4.6 g/dL (ref 3.7–4.7)
Alkaline Phosphatase: 64 IU/L (ref 39–117)
BUN/Creatinine Ratio: 15 (ref 12–28)
BUN: 12 mg/dL (ref 8–27)
Bilirubin Total: 0.5 mg/dL (ref 0.0–1.2)
CO2: 23 mmol/L (ref 20–29)
Calcium: 9.8 mg/dL (ref 8.7–10.3)
Chloride: 103 mmol/L (ref 96–106)
Creatinine, Ser: 0.81 mg/dL (ref 0.57–1.00)
GFR calc Af Amer: 83 mL/min/{1.73_m2} (ref >59)
GFR calc non Af Amer: 72 mL/min/{1.73_m2} (ref >59)
Globulin, Total: 2 g/dL (ref 1.5–4.5)
Glucose: 91 mg/dL (ref 65–99)
Potassium: 4.4 mmol/L (ref 3.5–5.2)
Sodium: 141 mmol/L (ref 134–144)
Total Protein: 6.6 g/dL (ref 6.0–8.5)

## 2018-10-04 LAB — CBC WITH DIFFERENTIAL/PLATELET
Basophils Absolute: 0 10*3/uL (ref 0.0–0.2)
Basos: 0 %
EOS (ABSOLUTE): 0.2 10*3/uL (ref 0.0–0.4)
Eos: 3 %
Hematocrit: 37.8 % (ref 34.0–46.6)
Hemoglobin: 13 g/dL (ref 11.1–15.9)
Immature Grans (Abs): 0 10*3/uL (ref 0.0–0.1)
Immature Granulocytes: 0 %
Lymphocytes Absolute: 1.6 10*3/uL (ref 0.7–3.1)
Lymphs: 22 %
MCH: 31.2 pg (ref 26.6–33.0)
MCHC: 34.4 g/dL (ref 31.5–35.7)
MCV: 91 fL (ref 79–97)
Monocytes Absolute: 0.5 10*3/uL (ref 0.1–0.9)
Monocytes: 8 %
Neutrophils Absolute: 4.7 10*3/uL (ref 1.4–7.0)
Neutrophils: 67 %
Platelets: 275 10*3/uL (ref 150–450)
RBC: 4.17 x10E6/uL (ref 3.77–5.28)
RDW: 12.9 % (ref 11.7–15.4)
WBC: 7.1 10*3/uL (ref 3.4–10.8)

## 2018-10-04 LAB — LIPID PANEL
Chol/HDL Ratio: 2.4 ratio (ref 0.0–4.4)
Cholesterol, Total: 157 mg/dL (ref 100–199)
HDL: 65 mg/dL (ref >39)
LDL Calculated: 76 mg/dL (ref 0–99)
Triglycerides: 78 mg/dL (ref 0–149)
VLDL Cholesterol Cal: 16 mg/dL (ref 5–40)

## 2018-10-04 LAB — TSH: TSH: 1.63 u[IU]/mL (ref 0.450–4.500)

## 2018-10-05 ENCOUNTER — Telehealth: Payer: Self-pay

## 2018-10-05 NOTE — Telephone Encounter (Signed)
Patient called about accessing her lab results on MyChart. She would like you to call her back.

## 2018-10-05 NOTE — Telephone Encounter (Signed)
Patient was able to see lab results.

## 2018-11-14 DIAGNOSIS — I471 Supraventricular tachycardia: Secondary | ICD-10-CM | POA: Diagnosis not present

## 2018-11-14 DIAGNOSIS — I493 Ventricular premature depolarization: Secondary | ICD-10-CM | POA: Insufficient documentation

## 2018-11-14 DIAGNOSIS — I442 Atrioventricular block, complete: Secondary | ICD-10-CM | POA: Diagnosis not present

## 2018-11-14 DIAGNOSIS — E782 Mixed hyperlipidemia: Secondary | ICD-10-CM | POA: Diagnosis not present

## 2019-02-20 DIAGNOSIS — L570 Actinic keratosis: Secondary | ICD-10-CM | POA: Diagnosis not present

## 2019-03-20 DIAGNOSIS — L570 Actinic keratosis: Secondary | ICD-10-CM | POA: Diagnosis not present

## 2019-03-29 DIAGNOSIS — L821 Other seborrheic keratosis: Secondary | ICD-10-CM | POA: Diagnosis not present

## 2019-03-29 DIAGNOSIS — L570 Actinic keratosis: Secondary | ICD-10-CM | POA: Diagnosis not present

## 2019-03-29 DIAGNOSIS — Z872 Personal history of diseases of the skin and subcutaneous tissue: Secondary | ICD-10-CM | POA: Diagnosis not present

## 2019-05-03 DIAGNOSIS — I442 Atrioventricular block, complete: Secondary | ICD-10-CM | POA: Diagnosis not present

## 2019-05-10 ENCOUNTER — Telehealth: Payer: Self-pay

## 2019-05-10 NOTE — Telephone Encounter (Signed)
CPE scheduled  

## 2019-05-10 NOTE — Telephone Encounter (Signed)
Copied from [place] [phone]. Topic: Appointment Scheduling - Scheduling Inquiry for Clinic >> May 10, 2019  8:44 AM Scherrie Gerlach wrote: Reason for CRM: pt has AWV 05/19/19.  Pt is trying to schedule cpe, and prefers to keep these 2 appts close together.  Pt needs Mon, Tues or Wed.

## 2019-05-19 ENCOUNTER — Ambulatory Visit: Payer: Self-pay

## 2019-05-19 ENCOUNTER — Encounter: Payer: PPO | Admitting: Family Medicine

## 2019-05-22 NOTE — Progress Notes (Signed)
Subjective:   Natalie Rosales is a 74 y.o. female who presents for Medicare Annual (Subsequent) preventive examination.    This visit is being conducted through telemedicine due to the COVID-19 pandemic. This patient has given me verbal consent via doximity to conduct this visit, patient states they are participating from their home address. Some vital signs may be absent or patient reported.    Patient identification: identified by name, DOB, and current address  Review of Systems:  N/A  Cardiac Risk Factors include: advanced age (>38men, >47 women);dyslipidemia     Objective:     Vitals: There were no vitals taken for this visit.  There is no height or weight on file to calculate BMI. Unable to obtain vitals due to visit being conducted via telephonically.   Advanced Directives 05/23/2019 05/16/2018 06/10/2015  Does Patient Have a Medical Advance Directive? No No No  Would patient like information on creating a medical advance directive? No - Patient declined No - Patient declined No - patient declined information    Tobacco Social History   Tobacco Use  Smoking Status Former Smoker  . Types: Cigarettes  Smokeless Tobacco Never Used  Tobacco Comment   quit 2005     Counseling given: Not Answered Comment: quit 2005   Clinical Intake:  Pre-visit preparation completed: Yes  Pain : No/denies pain Pain Score: 0-No pain     Nutritional Risks: None Diabetes: No  How often do you need to have someone help you when you read instructions, pamphlets, or other written materials from your doctor or pharmacy?: 1 - Never  Interpreter Needed?: No  Information entered by :: [facility], LPN  Past Medical History:  Diagnosis Date  . Allergy   . Anxiety   . Arthritis    back  . Back pain    lower back  . Breast cancer ([place]) 2005    left breast ca, Lumpectomy, f/u with radiation   . Complete heart block ([place])    Pacemaker placed 2005  . Hyperlipidemia   . Personal  history of radiation therapy   . PONV (postoperative nausea and vomiting)   . Wears contact lenses   . Wears hearing aid    bilateral   Past Surgical History:  Procedure Laterality Date  . ABDOMINAL HYSTERECTOMY    . APPENDECTOMY  1982  . BREAST LUMPECTOMY Left 2005   f/u radiation   . CARDIAC PACEMAKER PLACEMENT  2005  . CHOLECYSTECTOMY  1982  . COLONOSCOPY WITH PROPOFOL N/A 06/10/2015   Procedure: COLONOSCOPY WITH PROPOFOL;  Surgeon: Lucilla Lame, MD;  Location: [facility];  Service: Endoscopy;  Laterality: N/A;  . HIP FRACTURE SURGERY Right 2012   Three pins    Family History  Problem Relation Age of Onset  . Cancer Mother   . Cancer Father        Lymphoma or Pancreatic Cancer  . Pancreatic cancer Father   . Lymphoma Father   . Heart disease Sister   . Breast cancer Sister   . Bladder Cancer Brother   . Lymphoma Brother   . Breast cancer Cousin   . Esophageal cancer Niece    Social History   Socioeconomic History  . Marital status: Married    Spouse name: [name]  . Number of children: 3  . Years of education: College  . Highest education level: Associate degree: academic program  Occupational History  . 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  Tobacco Use  .  Smoking status: Former Smoker    Types: Cigarettes  . Smokeless tobacco: Never Used  . Tobacco comment: quit 2005  Substance and Sexual Activity  . Alcohol use: Not Currently  . Drug use: No  . Sexual activity: Not Currently  Other Topics Concern  . Not on file  Social History Narrative  . Not on file   Social Determinants of Health   Financial Resource Strain: Low Risk   . Difficulty of Paying Living Expenses: Not hard at all  Food Insecurity: No Food Insecurity  . Worried About Charity fundraiser in the Last Year: Never true  . Ran Out of Food in the Last Year: Never true  Transportation Needs: No Transportation Needs  . Lack of Transportation (Medical): No  . Lack of  Transportation (Non-Medical): No  Physical Activity: Sufficiently Active  . Days of Exercise per Week: 4 days  . Minutes of Exercise per Session: 40 min  Stress: No Stress Concern Present  . Feeling of Stress : Not at all  Social Connections: Slightly Isolated  . Frequency of Communication with Friends and Family: More than three times a week  . Frequency of Social Gatherings with Friends and Family: Twice a week  . Attends Religious Services: More than 4 times per year  . Active Member of Clubs or Organizations: No  . Attends Archivist Meetings: Never  . Marital Status: Married    Outpatient Encounter Medications as of 05/23/2019  Medication Sig  . ALPRAZolam (XANAX) 0.25 MG tablet Take 1 tablet (0.25 mg total) by mouth at bedtime as needed for anxiety.  . celecoxib (CELEBREX) 200 MG capsule TAKE 1 CAPSULE (200 MG TOTAL) BY MOUTH DAILY. (Patient taking differently: Take 200 mg by mouth daily. Currently taking EOD)  . gabapentin (NEURONTIN) 300 MG capsule Take 600 mg by mouth 2 (two) times daily.   . mometasone (NASONEX) 50 MCG/ACT nasal spray Place 2 sprays into the nose daily. (Patient taking differently: Place 2 sprays into the nose daily as needed. )  . omeprazole (PRILOSEC) 20 MG capsule Take 20 mg by mouth daily. As needed only  . rosuvastatin (CRESTOR) 5 MG tablet Take 5 mg by mouth. Takes once a week  . valACYclovir (VALTREX) 1000 MG tablet Take 500 mg by mouth 2 (two) times daily. As needed  . tiZANidine (ZANAFLEX) 4 MG tablet Take 2-4 mg by mouth at bedtime as needed.   No facility-administered encounter medications on file as of 05/23/2019.    Activities of Daily Living In your present state of health, do you have any difficulty performing the following activities: 05/23/2019  Hearing? Y  Comment Wears bilateral hearing aids.  Vision? N  Difficulty concentrating or making decisions? N  Walking or climbing stairs? N  Dressing or bathing? N  Doing errands,  shopping? N  Preparing Food and eating ? N  Using the Toilet? N  In the past six months, have you accidently leaked urine? N  Do you have problems with loss of bowel control? N  Managing your Medications? N  Managing your Finances? N  Housekeeping or managing your Housekeeping? N  Some recent data might be hidden    Patient Care Team: Virginia Crews, MD as PCP - General (Family Medicine) Corey Skains, MD as Consulting Physician (Cardiology)    Assessment:   This is a routine wellness examination for [place].  Exercise Activities and Dietary recommendations Current Exercise Habits: Home exercise routine, Type of exercise: treadmill;walking, Time (Minutes):  45, Frequency (Times/Week): 4, Weekly Exercise (Minutes/Week): 180, Intensity: Mild, Exercise limited by: orthopedic condition(s)  Goals    . DIET - INCREASE WATER INTAKE     Recommend to drink at least 6-8 8oz glasses of water per day.       Fall Risk: Fall Risk  05/23/2019 05/16/2018 03/04/2018 03/03/2017 01/13/2016  Falls in the past year? 0 0 0 No No  Number falls in past yr: 0 - - - -  Injury with Fall? 0 - - - -    FALL RISK PREVENTION PERTAINING TO THE HOME:  Any stairs in or around the home? Yes  If so, are there any without handrails? No   Home free of loose throw rugs in walkways, pet beds, electrical cords, etc? Yes  Adequate lighting in your home to reduce risk of falls? Yes   ASSISTIVE DEVICES UTILIZED TO PREVENT FALLS:  Life alert? No  Use of a cane, walker or w/c? No  Grab bars in the bathroom? No  Shower chair or bench in shower? No  Elevated toilet seat or a handicapped toilet? No    TIMED UP AND GO:  Was the test performed? No .    Depression Screen PHQ 2/9 Scores 05/23/2019 05/23/2019 10/03/2018 05/16/2018  PHQ - 2 Score 0 0 0 0  PHQ- 9 Score - - 3 -     Cognitive Function: Declined today.         Immunization History  Administered Date(s) Administered  . Pneumococcal  Conjugate-13 11/26/2014  . Pneumococcal Polysaccharide-23 01/13/2016  . Tdap 03/03/2017    Qualifies for Shingles Vaccine? Yes . Due for Shingrix. Pt has been advised to call insurance company to determine out of pocket expense. Advised may also receive vaccine at local pharmacy or Health Dept. Verbalized acceptance and understanding.  Tdap: Up to date  Flu Vaccine: Not a candidate for this vaccine.   Pneumococcal Vaccine: Completed series  Screening Tests Health Maintenance  Topic Date Due  . DEXA SCAN  04/27/2019  . MAMMOGRAM  05/16/2020  . COLONOSCOPY  06/09/2025  . TETANUS/TDAP  03/04/2027  . Hepatitis C Screening  Completed  . PNA vac Low Risk Adult  Completed    Cancer Screenings:  Colorectal Screening: Completed 06/10/15. Repeat every 10 years.   Mammogram: Completed 05/16/18. Repeat every 1-2 years as advised.   Bone Density: Completed 04/26/17. Results reflect OSTEOPOROSIS. Repeat every 2 years. Ordered today. Pt provided with contact info and advised to call to schedule appt. Pt aware the office will call re: appt.  Lung Cancer Screening: (Low Dose CT Chest recommended if Age 37-80 years, 30 pack-year currently smoking OR have quit w/in 15years.) does not qualify.   Additional Screening:  Hepatitis C Screening: Up to date  Vision Screening: Recommended annual ophthalmology exams for early detection of glaucoma and other disorders of the eye.  Dental Screening: Recommended annual dental exams for proper oral hygiene  Community Resource Referral:  CRR required this visit?  No       Plan:  I have personally reviewed and addressed the Medicare Annual Wellness questionnaire and have noted the following in the patient's chart:  A. Medical and social history B. Use of alcohol, tobacco or illicit drugs  C. Current medications and supplements D. Functional ability and status E.  Nutritional status F.  Physical activity G. Advance directives H. List of other  physicians I.  Hospitalizations, surgeries, and ER visits in previous 12 months J.  Vitals K. Screenings such  as hearing and vision if needed, cognitive and depression L. Referrals and appointments   In addition, I have reviewed and discussed with patient certain preventive protocols, quality metrics, and best practice recommendations. A written personalized care plan for preventive services as well as general preventive health recommendations were provided to patient. Nurse Health Advisor  Signed,    Alanea Woolridge Rentz, [place]  [id number] Nurse Health Advisor   Nurse Notes: Order placed for a mammogram and DEXA scan.

## 2019-05-23 ENCOUNTER — Other Ambulatory Visit: Payer: Self-pay

## 2019-05-23 ENCOUNTER — Ambulatory Visit (INDEPENDENT_AMBULATORY_CARE_PROVIDER_SITE_OTHER): Payer: PPO

## 2019-05-23 DIAGNOSIS — Z1231 Encounter for screening mammogram for malignant neoplasm of breast: Secondary | ICD-10-CM | POA: Diagnosis not present

## 2019-05-23 DIAGNOSIS — E2839 Other primary ovarian failure: Secondary | ICD-10-CM

## 2019-05-23 DIAGNOSIS — Z0000 Encounter for general adult medical examination without abnormal findings: Secondary | ICD-10-CM | POA: Diagnosis not present

## 2019-05-23 NOTE — Patient Instructions (Addendum)
Natalie Rosales , Thank you for taking time to come for your Medicare Wellness Visit. I appreciate your ongoing commitment to your health goals. Please review the following plan we discussed and let me know if I can assist you in the future.   Screening recommendations/referrals: Colonoscopy: Up to date, due 05/2025 Mammogram: Up to date, due 04/2020 Bone Density: Ordered today. Pt provided with contact info and advised to call to schedule appt. Pt aware the office will call re: appt. Recommended yearly ophthalmology/optometry visit for glaucoma screening and checkup Recommended yearly dental visit for hygiene and checkup  Vaccinations: Pneumococcal vaccine: Completed series Tdap vaccine: Up to date, due 02/2027 Shingles vaccine: Pt declines today.     Advanced directives: Advance directive discussed with you today. Even though you declined this today please call our office should you change your mind and we can give you the proper paperwork for you to fill out.  Conditions/risks identified: Recommend increasing water intake to 6-8 8 oz glasses a day.   Next appointment: 07/03/19 @ 3:00 PM with Dr Brita Romp. Declined scheduling an AWV for 2022 at this time.    Preventive Care 7 Years and Older, Female Preventive care refers to lifestyle choices and visits with your health care provider that can promote health and wellness. What does preventive care include?  A yearly physical exam. This is also called an annual well check.  Dental exams once or twice a year.  Routine eye exams. Ask your health care provider how often you should have your eyes checked.  Personal lifestyle choices, including:  Daily care of your teeth and gums.  Regular physical activity.  Eating a healthy diet.  Avoiding tobacco and drug use.  Limiting alcohol use.  Practicing safe sex.  Taking low-dose aspirin every day.  Taking vitamin and mineral supplements as recommended by your health care provider. What  happens during an annual well check? The services and screenings done by your health care provider during your annual well check will depend on your age, overall health, lifestyle risk factors, and family history of disease. Counseling  Your health care provider may ask you questions about your:  Alcohol use.  Tobacco use.  Drug use.  Emotional well-being.  Home and relationship well-being.  Sexual activity.  Eating habits.  History of falls.  Memory and ability to understand (cognition).  Work and work Statistician.  Reproductive health. Screening  You may have the following tests or measurements:  Height, weight, and BMI.  Blood pressure.  Lipid and cholesterol levels. These may be checked every 5 years, or more frequently if you are over 102 years old.  Skin check.  Lung cancer screening. You may have this screening every year starting at age 55 if you have a 30-pack-year history of smoking and currently smoke or have quit within the past 15 years.  Fecal occult blood test (FOBT) of the stool. You may have this test every year starting at age 41.  Flexible sigmoidoscopy or colonoscopy. You may have a sigmoidoscopy every 5 years or a colonoscopy every 10 years starting at age 74.  Hepatitis C blood test.  Hepatitis B blood test.  Sexually transmitted disease (STD) testing.  Diabetes screening. This is done by checking your blood sugar (glucose) after you have not eaten for a while (fasting). You may have this done every 1-3 years.  Bone density scan. This is done to screen for osteoporosis. You may have this done starting at age 63.  Mammogram. This may  be done every 1-2 years. Talk to your health care provider about how often you should have regular mammograms. Talk with your health care provider about your test results, treatment options, and if necessary, the need for more tests. Vaccines  Your health care provider may recommend certain vaccines, such  as:  Influenza vaccine. This is recommended every year.  Tetanus, diphtheria, and acellular pertussis (Tdap, Td) vaccine. You may need a Td booster every 10 years.  Zoster vaccine. You may need this after age 26.  Pneumococcal 13-valent conjugate (PCV13) vaccine. One dose is recommended after age 2.  Pneumococcal polysaccharide (PPSV23) vaccine. One dose is recommended after age 60. Talk to your health care provider about which screenings and vaccines you need and how often you need them. This information is not intended to replace advice given to you by your health care provider. Make sure you discuss any questions you have with your health care provider. Document Released: 05/10/2015 Document Revised: 01/01/2016 Document Reviewed: 02/12/2015 Elsevier Interactive Patient Education  2017 [place] Prevention in the Home Falls can cause injuries. They can happen to people of all ages. There are many things you can do to make your home safe and to help prevent falls. What can I do on the outside of my home?  Regularly fix the edges of walkways and driveways and fix any cracks.  Remove anything that might make you trip as you walk through a door, such as a raised step or threshold.  Trim any bushes or trees on the path to your home.  Use bright outdoor lighting.  Clear any walking paths of anything that might make someone trip, such as rocks or tools.  Regularly check to see if handrails are loose or broken. Make sure that both sides of any steps have handrails.  Any raised decks and porches should have guardrails on the edges.  Have any leaves, snow, or ice cleared regularly.  Use sand or salt on walking paths during winter.  Clean up any spills in your garage right away. This includes oil or grease spills. What can I do in the bathroom?  Use night lights.  Install grab bars by the toilet and in the tub and shower. Do not use towel bars as grab bars.  Use  non-skid mats or decals in the tub or shower.  If you need to sit down in the shower, use a plastic, non-slip stool.  Keep the floor dry. Clean up any water that spills on the floor as soon as it happens.  Remove soap buildup in the tub or shower regularly.  Attach bath mats securely with double-sided non-slip rug tape.  Do not have throw rugs and other things on the floor that can make you trip. What can I do in the bedroom?  Use night lights.  Make sure that you have a light by your bed that is easy to reach.  Do not use any sheets or blankets that are too big for your bed. They should not hang down onto the floor.  Have a firm chair that has side arms. You can use this for support while you get dressed.  Do not have throw rugs and other things on the floor that can make you trip. What can I do in the kitchen?  Clean up any spills right away.  Avoid walking on wet floors.  Keep items that you use a lot in easy-to-reach places.  If you need to reach something above you,  use a strong step stool that has a grab bar.  Keep electrical cords out of the way.  Do not use floor polish or wax that makes floors slippery. If you must use wax, use non-skid floor wax.  Do not have throw rugs and other things on the floor that can make you trip. What can I do with my stairs?  Do not leave any items on the stairs.  Make sure that there are handrails on both sides of the stairs and use them. Fix handrails that are broken or loose. Make sure that handrails are as long as the stairways.  Check any carpeting to make sure that it is firmly attached to the stairs. Fix any carpet that is loose or worn.  Avoid having throw rugs at the top or bottom of the stairs. If you do have throw rugs, attach them to the floor with carpet tape.  Make sure that you have a light switch at the top of the stairs and the bottom of the stairs. If you do not have them, ask someone to add them for you. What  else can I do to help prevent falls?  Wear shoes that:  Do not have high heels.  Have rubber bottoms.  Are comfortable and fit you well.  Are closed at the toe. Do not wear sandals.  If you use a stepladder:  Make sure that it is fully opened. Do not climb a closed stepladder.  Make sure that both sides of the stepladder are locked into place.  Ask someone to hold it for you, if possible.  Clearly mark and make sure that you can see:  Any grab bars or handrails.  First and last steps.  Where the edge of each step is.  Use tools that help you move around (mobility aids) if they are needed. These include:  Canes.  Walkers.  Scooters.  Crutches.  Turn on the lights when you go into a dark area. Replace any light bulbs as soon as they burn out.  Set up your furniture so you have a clear path. Avoid moving your furniture around.  If any of your floors are uneven, fix them.  If there are any pets around you, be aware of where they are.  Review your medicines with your doctor. Some medicines can make you feel dizzy. This can increase your chance of falling. Ask your doctor what other things that you can do to help prevent falls. This information is not intended to replace advice given to you by your health care provider. Make sure you discuss any questions you have with your health care provider. Document Released: 02/07/2009 Document Revised: 09/19/2015 Document Reviewed: 05/18/2014 Elsevier Interactive Patient Education  2017 [employer].

## 2019-06-19 ENCOUNTER — Telehealth: Payer: Self-pay

## 2019-06-19 DIAGNOSIS — Z1231 Encounter for screening mammogram for malignant neoplasm of breast: Secondary | ICD-10-CM

## 2019-06-19 DIAGNOSIS — M8589 Other specified disorders of bone density and structure, multiple sites: Secondary | ICD-10-CM

## 2019-06-19 NOTE — Telephone Encounter (Signed)
Mammogram and bone density reordered to be done at [facility]

## 2019-06-19 NOTE — Telephone Encounter (Signed)
Copied from [place] [id number]. Topic: General - Other >> Jun 19, 2019  8:47 AM Parke Poisson wrote: Reason for CRM Order for mammogram & bone density has been entered to be done at [facility] will not let me schedule this until order has been change to be done at their facility

## 2019-07-03 ENCOUNTER — Ambulatory Visit (INDEPENDENT_AMBULATORY_CARE_PROVIDER_SITE_OTHER): Payer: PPO | Admitting: Family Medicine

## 2019-07-03 ENCOUNTER — Encounter: Payer: Self-pay | Admitting: Family Medicine

## 2019-07-03 ENCOUNTER — Other Ambulatory Visit: Payer: Self-pay

## 2019-07-03 VITALS — BP 110/65 | HR 64 | Temp 97.3°F | Resp 16 | Ht 61.0 in | Wt 122.6 lb

## 2019-07-03 DIAGNOSIS — I442 Atrioventricular block, complete: Secondary | ICD-10-CM

## 2019-07-03 DIAGNOSIS — M545 Low back pain, unspecified: Secondary | ICD-10-CM

## 2019-07-03 DIAGNOSIS — F419 Anxiety disorder, unspecified: Secondary | ICD-10-CM

## 2019-07-03 DIAGNOSIS — J302 Other seasonal allergic rhinitis: Secondary | ICD-10-CM | POA: Diagnosis not present

## 2019-07-03 DIAGNOSIS — M5441 Lumbago with sciatica, right side: Secondary | ICD-10-CM | POA: Diagnosis not present

## 2019-07-03 DIAGNOSIS — G8929 Other chronic pain: Secondary | ICD-10-CM

## 2019-07-03 DIAGNOSIS — I471 Supraventricular tachycardia: Secondary | ICD-10-CM | POA: Diagnosis not present

## 2019-07-03 DIAGNOSIS — Z0000 Encounter for general adult medical examination without abnormal findings: Secondary | ICD-10-CM

## 2019-07-03 DIAGNOSIS — E782 Mixed hyperlipidemia: Secondary | ICD-10-CM

## 2019-07-03 MED ORDER — PREDNISONE 10 MG PO TABS
ORAL_TABLET | ORAL | 0 refills | Status: DC
Start: 2019-07-03 — End: 2019-07-11

## 2019-07-03 MED ORDER — CELECOXIB 200 MG PO CAPS: 200.0000 mg | ORAL_CAPSULE | Freq: Every day | ORAL | 1 refills | Status: DC

## 2019-07-03 MED ORDER — GABAPENTIN 300 MG PO CAPS
600.0000 mg | ORAL_CAPSULE | Freq: Two times a day (BID) | ORAL | 3 refills | Status: DC
Start: 2019-07-03 — End: 2020-07-16

## 2019-07-03 MED ORDER — ALPRAZOLAM 0.25 MG PO TABS: 0.2500 mg | ORAL_TABLET | Freq: Every evening | ORAL | 2 refills | Status: DC | PRN

## 2019-07-03 MED ORDER — MOMETASONE FUROATE 50 MCG/ACT NA SUSP: 2.0000 | Freq: Every day | NASAL | 3 refills | Status: AC

## 2019-07-03 NOTE — Assessment & Plan Note (Signed)
Chronic and well controlled Denies most symptoms Continue Xanax sparingly - last Rx about 1 yr ago Have discussed risks of benzos in the elderly previously

## 2019-07-03 NOTE — Assessment & Plan Note (Signed)
Trial of prednisone burst and taper

## 2019-07-03 NOTE — Assessment & Plan Note (Signed)
Not currently on statin as her sister had significant complications from one Recheck CMP and FLP

## 2019-07-03 NOTE — Assessment & Plan Note (Signed)
Continue Nasonex

## 2019-07-03 NOTE — Patient Instructions (Addendum)
The CDC recommends two doses of Shingrix (the shingles vaccine) separated by 2 to 6 months for adults age 74 years and older. I recommend checking with your insurance plan regarding coverage for this vaccine.     Preventive Care 88 Years and Older, Female Preventive care refers to lifestyle choices and visits with your health care provider that can promote health and wellness. This includes:  A yearly physical exam. This is also called an annual well check.  Regular dental and eye exams.  Immunizations.  Screening for certain conditions.  Healthy lifestyle choices, such as diet and exercise. What can I expect for my preventive care visit? Physical exam Your health care provider will check:  Height and weight. These may be used to calculate body mass index (BMI), which is a measurement that tells if you are at a healthy weight.  Heart rate and blood pressure.  Your skin for abnormal spots. Counseling Your health care provider may ask you questions about:  Alcohol, tobacco, and drug use.  Emotional well-being.  Home and relationship well-being.  Sexual activity.  Eating habits.  History of falls.  Memory and ability to understand (cognition).  Work and work Statistician.  Pregnancy and menstrual history. What immunizations do I need?  Influenza (flu) vaccine  This is recommended every year. Tetanus, diphtheria, and pertussis (Tdap) vaccine  You may need a Td booster every 10 years. Varicella (chickenpox) vaccine  You may need this vaccine if you have not already been vaccinated. Zoster (shingles) vaccine  You may need this after age 2. Pneumococcal conjugate (PCV13) vaccine  One dose is recommended after age 59. Pneumococcal polysaccharide (PPSV23) vaccine  One dose is recommended after age 56. Measles, mumps, and rubella (MMR) vaccine  You may need at least one dose of MMR if you were born in 1957 or later. You may also need a second  dose. Meningococcal conjugate (MenACWY) vaccine  You may need this if you have certain conditions. Hepatitis A vaccine  You may need this if you have certain conditions or if you travel or work in places where you may be exposed to hepatitis A. Hepatitis B vaccine  You may need this if you have certain conditions or if you travel or work in places where you may be exposed to hepatitis B. Haemophilus influenzae type b (Hib) vaccine  You may need this if you have certain conditions. You may receive vaccines as individual doses or as more than one vaccine together in one shot (combination vaccines). Talk with your health care provider about the risks and benefits of combination vaccines. What tests do I need? Blood tests  Lipid and cholesterol levels. These may be checked every 5 years, or more frequently depending on your overall health.  Hepatitis C test.  Hepatitis B test. Screening  Lung cancer screening. You may have this screening every year starting at age 41 if you have a 30-pack-year history of smoking and currently smoke or have quit within the past 15 years.  Colorectal cancer screening. All adults should have this screening starting at age 42 and continuing until age 56. Your health care provider may recommend screening at age 34 if you are at increased risk. You will have tests every 1-10 years, depending on your results and the type of screening test.  Diabetes screening. This is done by checking your blood sugar (glucose) after you have not eaten for a while (fasting). You may have this done every 1-3 years.  Mammogram. This may  be done every 1-2 years. Talk with your health care provider about how often you should have regular mammograms.  BRCA-related cancer screening. This may be done if you have a family history of breast, ovarian, tubal, or peritoneal cancers. Other tests  Sexually transmitted disease (STD) testing.  Bone density scan. This is done to screen for  osteoporosis. You may have this done starting at age 19. Follow these instructions at home: Eating and drinking  Eat a diet that includes fresh fruits and vegetables, whole grains, lean protein, and low-fat dairy products. Limit your intake of foods with high amounts of sugar, saturated fats, and salt.  Take vitamin and mineral supplements as recommended by your health care provider.  Do not drink alcohol if your health care provider tells you not to drink.  If you drink alcohol: ? Limit how much you have to 0-1 drink a day. ? Be aware of how much alcohol is in your drink. In the [place] one drink equals one 12 oz bottle of beer (355 mL), one 5 oz glass of wine (148 mL), or one 1 oz glass of hard liquor (44 mL). Lifestyle  Take daily care of your teeth and gums.  Stay active. Exercise for at least 30 minutes on 5 or more days each week.  Do not use any products that contain nicotine or tobacco, such as cigarettes, e-cigarettes, and chewing tobacco. If you need help quitting, ask your health care provider.  If you are sexually active, practice safe sex. Use a condom or other form of protection in order to prevent STIs (sexually transmitted infections).  Talk with your health care provider about taking a low-dose aspirin or statin. What's next?  Go to your health care provider once a year for a well check visit.  Ask your health care provider how often you should have your eyes and teeth checked.  Stay up to date on all vaccines. This information is not intended to replace advice given to you by your health care provider. Make sure you discuss any questions you have with your health care provider. Document Revised: 04/07/2018 Document Reviewed: 04/07/2018 Elsevier Patient Education  2020 [employer].

## 2019-07-03 NOTE — Assessment & Plan Note (Signed)
Followed by cardiology Pacemaker in place 

## 2019-07-03 NOTE — Progress Notes (Signed)
Patient: Natalie Rosales, Female    DOB: 14-Sep-1945, 74 y.o.   MRN: [id number] Visit Date: 07/03/2019  Today's Provider: Lavon Paganini, MD   Chief Complaint  Patient presents with  . Annual Exam   Subjective:     Complete Physical Natalie Rosales is a 74 y.o. female. She feels well. She reports exercising daily. She reports she is sleeping fairly well.  05/23/2019 AWV 05/16/2018 Mammogram-BI-RADS 1 04/26/2017 BMD-Osteopenia 06/10/2015 Colonoscopy-WNL ----------------------------------------------------------- Sciatica is worse. R leg. Radiates to base of foot.  Prednisone has worked previously.  Affects gait.  Better in the morning.  Worse with weight . No weakness or numbness.  Dr Sharlet Salina followed previously and gave exercises and prednisone. This flare has been ongoing for a few months, but worse over the last several weeks.  Review of Systems  Constitutional: Negative.   HENT: Positive for tinnitus.   Eyes: Negative.   Respiratory: Negative.   Cardiovascular: Negative.   Gastrointestinal: Negative.   Endocrine: Negative.   Genitourinary: Negative.   Musculoskeletal: Positive for gait problem.  Skin: Negative.   Allergic/Immunologic: Positive for environmental allergies.  Hematological: Negative.   Psychiatric/Behavioral: Negative.     Social History   Socioeconomic History  . Marital status: Married    Spouse name: [name]  . Number of children: 3  . Years of education: College  . Highest education level: Associate degree: academic program  Occupational History  . 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  Tobacco Use  . Smoking status: Former Smoker    Types: Cigarettes  . Smokeless tobacco: Never Used  . Tobacco comment: quit 2005  Substance and Sexual Activity  . Alcohol use: Not Currently  . Drug use: No  . Sexual activity: Not Currently  Other Topics Concern  . Not on file  Social History Narrative  . Not on file   Social  Determinants of Health   Financial Resource Strain: Low Risk   . Difficulty of Paying Living Expenses: Not hard at all  Food Insecurity: No Food Insecurity  . Worried About Charity fundraiser in the Last Year: Never true  . Ran Out of Food in the Last Year: Never true  Transportation Needs: No Transportation Needs  . Lack of Transportation (Medical): No  . Lack of Transportation (Non-Medical): No  Physical Activity: Sufficiently Active  . Days of Exercise per Week: 4 days  . Minutes of Exercise per Session: 40 min  Stress: No Stress Concern Present  . Feeling of Stress : Not at all  Social Connections: Slightly Isolated  . Frequency of Communication with Friends and Family: More than three times a week  . Frequency of Social Gatherings with Friends and Family: Twice a week  . Attends Religious Services: More than 4 times per year  . Active Member of Clubs or Organizations: No  . Attends Archivist Meetings: Never  . Marital Status: Married  Human resources officer Violence: Not At Risk  . Fear of Current or Ex-Partner: No  . Emotionally Abused: No  . Physically Abused: No  . Sexually Abused: No    Past Medical History:  Diagnosis Date  . Allergy   . Anxiety   . Arthritis    back  . Back pain    lower back  . Breast cancer ([place]) 2005    left breast ca, Lumpectomy, f/u with radiation   . Complete heart block ([place])    Pacemaker placed  2005  . Hyperlipidemia   . Personal history of radiation therapy   . PONV (postoperative nausea and vomiting)   . Wears contact lenses   . Wears hearing aid    bilateral     Patient Active Problem List   Diagnosis Date Noted  . Ganglion cyst of finger of left hand 11/15/2017  . Adjustment disorder with mixed anxiety and depressed mood 09/27/2017  . LLQ pain 09/27/2017  . Constipation 09/27/2017  . PSVT (paroxysmal supraventricular tachycardia) ([place]) 02/11/2017  . Special screening for malignant neoplasm of intestine   . Low back  pain 05/28/2015  . Acquired complete AV block ([place]) 11/26/2014  . Combined fat and carbohydrate induced hyperlipemia 11/26/2014  . Arthritis, degenerative 11/26/2014  . Osteopenia 11/26/2014  . Artificial cardiac pacemaker 11/26/2014  . Anxiety 09/20/2014  . H/O fracture of hip 09/20/2014  . H/O malignant neoplasm of breast 09/20/2014  . Allergic rhinitis, seasonal 09/20/2014  . Gastro-esophageal reflux disease without esophagitis 01/31/2014  . Awareness of heartbeats 01/30/2014  . Closed fracture of intracapsular section of femur ([place]) 11/17/2011    Past Surgical History:  Procedure Laterality Date  . ABDOMINAL HYSTERECTOMY    . APPENDECTOMY  1982  . BREAST LUMPECTOMY Left 2005   f/u radiation   . CARDIAC PACEMAKER PLACEMENT  2005  . CHOLECYSTECTOMY  1982  . COLONOSCOPY WITH PROPOFOL N/A 06/10/2015   Procedure: COLONOSCOPY WITH PROPOFOL;  Surgeon: Lucilla Lame, MD;  Location: [facility];  Service: Endoscopy;  Laterality: N/A;  . HIP FRACTURE SURGERY Right 2012   Three pins     Her family history includes Bladder Cancer in her brother; Breast cancer in her cousin and sister; Cancer in her father and mother; Esophageal cancer in her niece; Heart disease in her sister; Lymphoma in her brother and father; Pancreatic cancer in her father.   Current Outpatient Medications:  .  ALPRAZolam (XANAX) 0.25 MG tablet, Take 1 tablet (0.25 mg total) by mouth at bedtime as needed for anxiety., Disp: 30 tablet, Rfl: 0 .  celecoxib (CELEBREX) 200 MG capsule, TAKE 1 CAPSULE (200 MG TOTAL) BY MOUTH DAILY. (Patient taking differently: Take 200 mg by mouth daily. Currently taking twice a week), Disp: 90 capsule, Rfl: 1 .  gabapentin (NEURONTIN) 300 MG capsule, Take 600 mg by mouth 2 (two) times daily. , Disp: , Rfl: 11 .  mometasone (NASONEX) 50 MCG/ACT nasal spray, Place 2 sprays into the nose daily. (Patient taking differently: Place 2 sprays into the nose daily as needed. ), Disp: 17 g, Rfl:  5 .  omeprazole (PRILOSEC) 20 MG capsule, Take 20 mg by mouth daily. As needed only, Disp: , Rfl:  .  valACYclovir (VALTREX) 1000 MG tablet, Take 500 mg by mouth 2 (two) times daily. As needed, Disp: , Rfl:  .  rosuvastatin (CRESTOR) 5 MG tablet, Take 5 mg by mouth. Takes once a week, Disp: , Rfl: 11 .  tiZANidine (ZANAFLEX) 4 MG tablet, Take 2-4 mg by mouth at bedtime as needed., Disp: , Rfl:   Patient Care Team: Virginia Crews, MD as PCP - General (Family Medicine) Corey Skains, MD as Consulting Physician (Cardiology)     Objective:    Vitals: BP 110/65 (BP Location: Right Arm, Patient Position: Sitting, Cuff Size: Normal)   Pulse 64   Temp (!) 97.3 F (36.3 C) (Temporal)   Resp 16   Ht 5\' 1"  (1.549 m)   Wt 122 lb 9.6 oz (55.6 kg)   BMI  23.17 kg/m   Physical Exam Vitals reviewed.  Constitutional:      General: She is not in acute distress.    Appearance: Normal appearance. She is well-developed. She is not diaphoretic.  HENT:     Head: Normocephalic and atraumatic.     Right Ear: Tympanic membrane, ear canal and external ear normal.     Left Ear: Tympanic membrane, ear canal and external ear normal.  Eyes:     General: No scleral icterus.    Conjunctiva/sclera: Conjunctivae normal.     Pupils: Pupils are equal, round, and reactive to light.  Neck:     Thyroid: No thyromegaly.  Cardiovascular:     Rate and Rhythm: Normal rate and regular rhythm.     Pulses: Normal pulses.     Heart sounds: Normal heart sounds. No murmur.  Pulmonary:     Effort: Pulmonary effort is normal. No respiratory distress.     Breath sounds: Normal breath sounds. No wheezing or rales.  Abdominal:     General: There is no distension.     Palpations: Abdomen is soft.     Tenderness: There is no abdominal tenderness.  Musculoskeletal:        General: No deformity.     Cervical back: Neck supple.     Right lower leg: No edema.     Left lower leg: No edema.  Lymphadenopathy:      Cervical: No cervical adenopathy.  Skin:    General: Skin is warm and dry.     Capillary Refill: Capillary refill takes less than 2 seconds.     Findings: No rash.  Neurological:     Mental Status: She is alert and oriented to person, place, and time. Mental status is at baseline.  Psychiatric:        Mood and Affect: Mood normal.        Behavior: Behavior normal.        Thought Content: Thought content normal.     Activities of Daily Living In your present state of health, do you have any difficulty performing the following activities: 05/23/2019  Hearing? Y  Comment Wears bilateral hearing aids.  Vision? N  Difficulty concentrating or making decisions? N  Walking or climbing stairs? N  Dressing or bathing? N  Doing errands, shopping? N  Preparing Food and eating ? N  Using the Toilet? N  In the past six months, have you accidently leaked urine? N  Do you have problems with loss of bowel control? N  Managing your Medications? N  Managing your Finances? N  Housekeeping or managing your Housekeeping? N  Some recent data might be hidden    Fall Risk Assessment Fall Risk  05/23/2019 05/16/2018 03/04/2018 03/03/2017 01/13/2016  Falls in the past year? 0 0 0 No No  Number falls in past yr: 0 - - - -  Injury with Fall? 0 - - - -     Depression Screen PHQ 2/9 Scores 05/23/2019 05/23/2019 10/03/2018 05/16/2018  PHQ - 2 Score 0 0 0 0  PHQ- 9 Score - - 3 -    No flowsheet data found.     Assessment & Plan:    Annual Physical Reviewed patient's Family Medical History Reviewed and updated list of patient's medical providers Assessment of cognitive impairment was done Assessed patient's functional ability Established a written schedule for health screening [place] Completed and Reviewed  Exercise Activities and Dietary recommendations Goals    . DIET -  INCREASE WATER INTAKE     Recommend to drink at least 6-8 8oz glasses of water per day.        Immunization History  Administered Date(s) Administered  . Pneumococcal Conjugate-13 11/26/2014  . Pneumococcal Polysaccharide-23 01/13/2016  . Tdap 03/03/2017    Health Maintenance  Topic Date Due  . DEXA SCAN  04/27/2019  . MAMMOGRAM  05/17/2019  . COLONOSCOPY  06/09/2025  . TETANUS/TDAP  03/04/2027  . Hepatitis C Screening  Completed  . PNA vac Low Risk Adult  Completed     Discussed health benefits of physical activity, and encouraged her to engage in regular exercise appropriate for her age and condition.    ------------------------------------------------------------------------------------------------------------   Problem List Items Addressed This Visit      Cardiovascular and Mediastinum   Acquired complete AV block ([place])    Followed by cardiology  Pacemaker in place      PSVT (paroxysmal supraventricular tachycardia) ([place])    Followed by Cardiology Pacemaker in place        Respiratory   Allergic rhinitis, seasonal    Continue Nasonex      Relevant Medications   mometasone (NASONEX) 50 MCG/ACT nasal spray     Other   Anxiety    Chronic and well controlled Denies most symptoms Continue Xanax sparingly - last Rx about 1 yr ago Have discussed risks of benzos in the elderly previously      Relevant Medications   ALPRAZolam (XANAX) 0.25 MG tablet   Combined fat and carbohydrate induced hyperlipemia    Not currently on statin as her sister had significant complications from one Recheck CMP and FLP      Relevant Orders   Comprehensive metabolic panel   Lipid Panel With LDL/HDL Ratio   Low back pain    Trial of prednisone burst and taper      Relevant Medications   predniSONE (DELTASONE) 10 MG tablet   celecoxib (CELEBREX) 200 MG capsule    Other Visit Diagnoses    Encounter for annual physical exam    -  Primary   Relevant Orders   CBC with Differential/Platelet   Comprehensive metabolic panel   Lipid Panel With LDL/HDL Ratio   TSH    Bilateral low back pain       Relevant Medications   predniSONE (DELTASONE) 10 MG tablet   celecoxib (CELEBREX) 200 MG capsule       Return in about 1 year (around 07/02/2020) for CPE/AWV.   The entirety of the information documented in the History of Present Illness, Review of Systems and Physical Exam were personally obtained by me. Portions of this information were initially documented by Lynford Humphrey, CMA and reviewed by me for thoroughness and accuracy.    Zenaya Ulatowski, Dionne Bucy, MD MPH [facility]

## 2019-07-03 NOTE — Assessment & Plan Note (Signed)
Followed by Cardiology Pacemaker in place

## 2019-07-05 DIAGNOSIS — R7989 Other specified abnormal findings of blood chemistry: Secondary | ICD-10-CM | POA: Diagnosis not present

## 2019-07-05 DIAGNOSIS — Z0000 Encounter for general adult medical examination without abnormal findings: Secondary | ICD-10-CM | POA: Diagnosis not present

## 2019-07-05 DIAGNOSIS — E782 Mixed hyperlipidemia: Secondary | ICD-10-CM | POA: Diagnosis not present

## 2019-07-05 DIAGNOSIS — R739 Hyperglycemia, unspecified: Secondary | ICD-10-CM | POA: Diagnosis not present

## 2019-07-06 ENCOUNTER — Other Ambulatory Visit: Payer: Self-pay

## 2019-07-06 LAB — CBC WITH DIFFERENTIAL/PLATELET
Basophils Absolute: 0 10*3/uL (ref 0.0–0.2)
Basos: 0 %
EOS (ABSOLUTE): 0 10*3/uL (ref 0.0–0.4)
Eos: 0 %
Hematocrit: 35.4 % (ref 34.0–46.6)
Hemoglobin: 12.1 g/dL (ref 11.1–15.9)
Immature Grans (Abs): 0 10*3/uL (ref 0.0–0.1)
Immature Granulocytes: 0 %
Lymphocytes Absolute: 1.8 10*3/uL (ref 0.7–3.1)
Lymphs: 16 %
MCH: 31 pg (ref 26.6–33.0)
MCHC: 34.2 g/dL (ref 31.5–35.7)
MCV: 91 fL (ref 79–97)
Monocytes Absolute: 0.9 10*3/uL (ref 0.1–0.9)
Monocytes: 8 %
Neutrophils Absolute: 8.5 10*3/uL — ABNORMAL HIGH (ref 1.4–7.0)
Neutrophils: 76 %
Platelets: 298 10*3/uL (ref 150–450)
RBC: 3.9 x10E6/uL (ref 3.77–5.28)
RDW: 12.3 % (ref 11.7–15.4)
WBC: 11.1 10*3/uL — ABNORMAL HIGH (ref 3.4–10.8)

## 2019-07-06 LAB — COMPREHENSIVE METABOLIC PANEL
ALT: 7 IU/L (ref 0–32)
AST: 19 IU/L (ref 0–40)
Albumin/Globulin Ratio: 2.2 (ref 1.2–2.2)
Albumin: 4.6 g/dL (ref 3.7–4.7)
Alkaline Phosphatase: 68 IU/L (ref 39–117)
BUN/Creatinine Ratio: 18 (ref 12–28)
BUN: 14 mg/dL (ref 8–27)
Bilirubin Total: 0.4 mg/dL (ref 0.0–1.2)
CO2: 22 mmol/L (ref 20–29)
Calcium: 9.8 mg/dL (ref 8.7–10.3)
Chloride: 105 mmol/L (ref 96–106)
Creatinine, Ser: 0.79 mg/dL (ref 0.57–1.00)
GFR calc Af Amer: 85 mL/min/{1.73_m2} (ref >59)
GFR calc non Af Amer: 74 mL/min/{1.73_m2} (ref >59)
Globulin, Total: 2.1 g/dL (ref 1.5–4.5)
Glucose: 108 mg/dL — ABNORMAL HIGH (ref 65–99)
Potassium: 5 mmol/L (ref 3.5–5.2)
Sodium: 141 mmol/L (ref 134–144)
Total Protein: 6.7 g/dL (ref 6.0–8.5)

## 2019-07-06 LAB — LIPID PANEL WITH LDL/HDL RATIO
Cholesterol, Total: 222 mg/dL — ABNORMAL HIGH (ref 100–199)
HDL: 66 mg/dL (ref >39)
LDL Chol Calc (NIH): 143 mg/dL — ABNORMAL HIGH (ref 0–99)
LDL/HDL Ratio: 2.2 ratio (ref 0.0–3.2)
Triglycerides: 75 mg/dL (ref 0–149)
VLDL Cholesterol Cal: 13 mg/dL (ref 5–40)

## 2019-07-06 LAB — TSH: TSH: 0.402 u[IU]/mL — ABNORMAL LOW (ref 0.450–4.500)

## 2019-07-06 MED ORDER — OMEPRAZOLE 20 MG PO CPDR: 20.0000 mg | DELAYED_RELEASE_CAPSULE | Freq: Every day | ORAL | 3 refills | Status: DC

## 2019-07-06 NOTE — Telephone Encounter (Signed)
-----   Message from Virginia Crews, MD sent at 07/06/2019  9:54 AM EST ----- Normal blood counts, except white blood cell count is slightly elevated.  This could be related to any recent infection, even if mild.  Repeat at next visit.  Normal kidney function, liver function, electrolytes.  Blood sugar is slightly elevated if she was fasting when these were taken.  Recommend adding on A1c.  Cholesterol has worsened since stopping statin.  10-year risk of heart disease/stroke is elevated at 13%.  The risk does decrease with a statin, but I understand her concerns.  TSH, thyroid-stimulating hormone, is very slightly low.  We should add on a free T4.

## 2019-07-06 NOTE — Telephone Encounter (Signed)
Patient advised as below. Patient reports that she was fasting and reports she has not been feeling sick. But that she is having to take prednisone for hip and leg pain. And wants to know if that would have anything to do with the abnormal labs. Patient reports that she is willing to start statin at a very low dose like 5 mg and will take only every other day. Patient requesting to have labs rechecked in a few weeks. Patient reports that her next appointment is till next year. Patient also requesting omeprazole refill.   Did call [facility] to add A1c and T4 free.

## 2019-07-07 LAB — HEMOGLOBIN A1C
Est. average glucose Bld gHb Est-mCnc: 120 mg/dL
Hgb A1c MFr Bld: 5.8 % — ABNORMAL HIGH (ref 4.8–5.6)

## 2019-07-07 LAB — SPECIMEN STATUS REPORT

## 2019-07-07 LAB — T4, FREE: Free T4: 1.02 ng/dL (ref 0.82–1.77)

## 2019-07-10 ENCOUNTER — Telehealth: Payer: Self-pay | Admitting: Family Medicine

## 2019-07-10 NOTE — Telephone Encounter (Signed)
Per initial encounter,"Pt called stating that her prescription for prednisone has helped some, but it has not helped completely"; contacted pt regarding her symptoms; she states she has sciatica, and was seen in office 07/02/29; she would like to know if she needs more prednisone because her pain continue or should she pursue getting shot as discussed at her previous appt;the pt says that she thinks Dr Melton Krebs, [facility]ronia Simas, does it; the pt can be contacted at [phone]; she is seen by Dr Brita Romp, [facility]; will route to office for final disposition.

## 2019-07-10 NOTE — Telephone Encounter (Signed)
Pt calling back to speak with Dr B nurse or Dr B. Pt would like a call back from the office as soon as possible. Please advise

## 2019-07-10 NOTE — Telephone Encounter (Signed)
Copied from [place] [id number]. Topic: General - Other >> Jul 10, 2019  8:04 AM Celene Kras wrote: Reason for CRM: Pt called stating that her prescription for prednisone has helped some, but it has not helped completely. She is requesting a call from a nurse and a prescription sent in for her. Please advise.  [id number] - [place], [place] [address] Phone: [phone] Fax: [phone] Not a 24 hour pharmacy; exact hours not known.

## 2019-07-11 ENCOUNTER — Telehealth: Payer: Self-pay

## 2019-07-11 MED ORDER — PREDNISONE 10 MG PO TABS: ORAL_TABLET | ORAL | 0 refills | Status: DC

## 2019-07-11 NOTE — Telephone Encounter (Signed)
Please advise 

## 2019-07-11 NOTE — Telephone Encounter (Signed)
Patient advised as below. Patient verbalizes understanding and is in agreement with treatment plan. Patient wants to know if she can restart cholesterol reducing medication, but will take every other day and just 5mg . Please advise.

## 2019-07-11 NOTE — Telephone Encounter (Signed)
-----   Message from Virginia Crews, MD sent at 07/10/2019  8:21 PM EDT ----- A1c is in prediabetic range. Recommend low carb diet to avoid progression to diabetes. T4 is normal, so no thyroid dysfunction

## 2019-07-11 NOTE — Telephone Encounter (Signed)
Can refill prednisone. If not helping, we can refer to that Ortho that she mentioned

## 2019-07-12 MED ORDER — ROSUVASTATIN CALCIUM 5 MG PO TABS: 5.0000 mg | ORAL_TABLET | ORAL | 1 refills | Status: DC

## 2019-07-12 NOTE — Telephone Encounter (Signed)
Ok to restart Crestor every other day.

## 2019-07-12 NOTE — Telephone Encounter (Signed)
Pt advised.  [facility] [place]   Thanks,   -Mickel Baas

## 2019-07-17 ENCOUNTER — Ambulatory Visit
Admission: RE | Admit: 2019-07-17 | Discharge: 2019-07-17 | Disposition: A | Discharge status: Home / Self Care | Payer: PPO | Source: Ambulatory Visit | Attending: Family Medicine | Admitting: Family Medicine

## 2019-07-17 ENCOUNTER — Telehealth: Payer: Self-pay

## 2019-07-17 DIAGNOSIS — Z1231 Encounter for screening mammogram for malignant neoplasm of breast: Secondary | ICD-10-CM | POA: Insufficient documentation

## 2019-07-17 DIAGNOSIS — M8589 Other specified disorders of bone density and structure, multiple sites: Secondary | ICD-10-CM

## 2019-07-17 DIAGNOSIS — M5136 Other intervertebral disc degeneration, lumbar region: Secondary | ICD-10-CM | POA: Diagnosis not present

## 2019-07-17 DIAGNOSIS — M5416 Radiculopathy, lumbar region: Secondary | ICD-10-CM | POA: Diagnosis not present

## 2019-07-17 DIAGNOSIS — M810 Age-related osteoporosis without current pathological fracture: Secondary | ICD-10-CM | POA: Diagnosis not present

## 2019-07-17 NOTE — Telephone Encounter (Signed)
Result note read to patient. Pt states she tried Fosamax about 15 years ago."  States she stopped taking and increased her exercise "thinking that would help" but has not been able to do much exercising past few years.  States she will try again if needed.

## 2019-07-17 NOTE — Telephone Encounter (Signed)
-----   Message from Virginia Crews, MD sent at 07/17/2019  1:41 PM EDT ----- Normal mammogram. Repeat in 1 yr

## 2019-07-17 NOTE — Telephone Encounter (Signed)
-----   Message from Virginia Crews, MD sent at 07/17/2019 12:39 PM EDT ----- Bone density has worsened over last 2 years.  She is a candidate for medication for osteoporosis.  Wonder if she has tried anything previously?

## 2019-07-17 NOTE — Telephone Encounter (Signed)
LMTCB

## 2019-07-17 NOTE — Telephone Encounter (Signed)
LMTCB PEC may give results 

## 2019-07-18 MED ORDER — ALENDRONATE SODIUM 70 MG PO TABS: 70.0000 mg | ORAL_TABLET | ORAL | 11 refills | Status: DC

## 2019-07-18 NOTE — Telephone Encounter (Signed)
Patient advised as below. Patient verbalizes understanding and is in agreement with treatment plan.  

## 2019-07-18 NOTE — Telephone Encounter (Signed)
OK to resume fosamax.  Take 1 tab weekly with a full glass of water on an empty stomach and sit upright for at least 1 hour after taking.  Repeat bone density test in 2 years.

## 2019-07-31 DIAGNOSIS — Z950 Presence of cardiac pacemaker: Secondary | ICD-10-CM | POA: Diagnosis not present

## 2019-07-31 DIAGNOSIS — I442 Atrioventricular block, complete: Secondary | ICD-10-CM | POA: Diagnosis not present

## 2019-07-31 DIAGNOSIS — I493 Ventricular premature depolarization: Secondary | ICD-10-CM | POA: Diagnosis not present

## 2019-07-31 DIAGNOSIS — E782 Mixed hyperlipidemia: Secondary | ICD-10-CM | POA: Diagnosis not present

## 2019-08-01 DIAGNOSIS — M5416 Radiculopathy, lumbar region: Secondary | ICD-10-CM | POA: Diagnosis not present

## 2019-08-01 DIAGNOSIS — M5136 Other intervertebral disc degeneration, lumbar region: Secondary | ICD-10-CM | POA: Diagnosis not present

## 2019-08-25 DIAGNOSIS — M5136 Other intervertebral disc degeneration, lumbar region: Secondary | ICD-10-CM | POA: Diagnosis not present

## 2019-08-25 DIAGNOSIS — M5416 Radiculopathy, lumbar region: Secondary | ICD-10-CM | POA: Diagnosis not present

## 2019-08-29 ENCOUNTER — Other Ambulatory Visit: Payer: Self-pay | Admitting: Family Medicine

## 2019-08-29 NOTE — Telephone Encounter (Signed)
Requested Prescriptions  Pending Prescriptions Disp Refills  . rosuvastatin (CRESTOR) 5 MG tablet [Pharmacy Med Name: ROSUVASTATIN CALCIUM 5 MG TAB] 45 tablet 3    Sig: TAKE 1 TABLET BY MOUTH EVERY OTHER DAY     Cardiovascular:  Antilipid - Statins Failed - 08/29/2019  1:35 AM      Failed - Total Cholesterol in normal range and within 360 days    Cholesterol, Total  Date Value Ref Range Status  07/05/2019 222 (H) 100 - 199 mg/dL Final         Passed - LDL in normal range and within 360 days    LDL Chol Calc (NIH)  Date Value Ref Range Status  07/05/2019 143 (H) 0 - 99 mg/dL Final         Passed - HDL in normal range and within 360 days    HDL  Date Value Ref Range Status  07/05/2019 66 >39 mg/dL Final         Passed - Triglycerides in normal range and within 360 days    Triglycerides  Date Value Ref Range Status  07/05/2019 75 0 - 149 mg/dL Final         Passed - Patient is not pregnant      Passed - Valid encounter within last 12 months    Recent Outpatient Visits          1 month ago Encounter for annual physical exam   [facility], Dionne Bucy, MD   11 months ago Anxiety   [facility], Dionne Bucy, MD   1 year ago Encounter for annual physical exam   [facility] [place], Dionne Bucy, MD   1 year ago Strain of left psoas muscle, initial encounter   [facility], Dionne Bucy, MD   1 year ago Adjustment disorder with mixed anxiety and depressed mood   [facility], Dionne Bucy, MD

## 2019-09-05 DIAGNOSIS — H35372 Puckering of macula, left eye: Secondary | ICD-10-CM | POA: Diagnosis not present

## 2019-09-20 DIAGNOSIS — L249 Irritant contact dermatitis, unspecified cause: Secondary | ICD-10-CM | POA: Diagnosis not present

## 2019-09-20 DIAGNOSIS — L570 Actinic keratosis: Secondary | ICD-10-CM | POA: Diagnosis not present

## 2019-09-20 DIAGNOSIS — Z872 Personal history of diseases of the skin and subcutaneous tissue: Secondary | ICD-10-CM | POA: Diagnosis not present

## 2019-09-20 DIAGNOSIS — L578 Other skin changes due to chronic exposure to nonionizing radiation: Secondary | ICD-10-CM | POA: Diagnosis not present

## 2019-09-29 ENCOUNTER — Other Ambulatory Visit: Payer: Self-pay | Admitting: Physical Medicine and Rehabilitation

## 2019-09-29 DIAGNOSIS — M4807 Spinal stenosis, lumbosacral region: Secondary | ICD-10-CM | POA: Diagnosis not present

## 2019-09-29 DIAGNOSIS — M5416 Radiculopathy, lumbar region: Secondary | ICD-10-CM | POA: Diagnosis not present

## 2019-09-29 DIAGNOSIS — M5136 Other intervertebral disc degeneration, lumbar region: Secondary | ICD-10-CM | POA: Diagnosis not present

## 2019-09-29 DIAGNOSIS — R52 Pain, unspecified: Secondary | ICD-10-CM | POA: Diagnosis not present

## 2019-10-05 ENCOUNTER — Ambulatory Visit: Payer: PPO

## 2019-10-09 ENCOUNTER — Ambulatory Visit
Admission: RE | Admit: 2019-10-09 | Discharge: 2019-10-09 | Disposition: A | Discharge status: Home / Self Care | Payer: PPO | Source: Ambulatory Visit | Attending: Physical Medicine and Rehabilitation | Admitting: Physical Medicine and Rehabilitation

## 2019-10-09 ENCOUNTER — Other Ambulatory Visit: Payer: Self-pay

## 2019-10-09 DIAGNOSIS — M48061 Spinal stenosis, lumbar region without neurogenic claudication: Secondary | ICD-10-CM | POA: Diagnosis not present

## 2019-10-09 DIAGNOSIS — M4807 Spinal stenosis, lumbosacral region: Secondary | ICD-10-CM | POA: Insufficient documentation

## 2019-10-17 DIAGNOSIS — M4807 Spinal stenosis, lumbosacral region: Secondary | ICD-10-CM | POA: Diagnosis not present

## 2019-10-17 DIAGNOSIS — M5136 Other intervertebral disc degeneration, lumbar region: Secondary | ICD-10-CM | POA: Diagnosis not present

## 2019-10-17 DIAGNOSIS — M5416 Radiculopathy, lumbar region: Secondary | ICD-10-CM | POA: Diagnosis not present

## 2019-10-25 DIAGNOSIS — H2511 Age-related nuclear cataract, right eye: Secondary | ICD-10-CM | POA: Diagnosis not present

## 2019-10-25 DIAGNOSIS — E7800 Pure hypercholesterolemia, unspecified: Secondary | ICD-10-CM | POA: Diagnosis not present

## 2019-11-06 ENCOUNTER — Other Ambulatory Visit: Payer: Self-pay

## 2019-11-06 ENCOUNTER — Encounter: Payer: Self-pay | Admitting: Ophthalmology

## 2019-11-07 DIAGNOSIS — I442 Atrioventricular block, complete: Secondary | ICD-10-CM | POA: Diagnosis not present

## 2019-11-13 NOTE — Discharge Instructions (Signed)

## 2019-11-15 ENCOUNTER — Ambulatory Visit: Payer: PPO | Admitting: Anesthesiology

## 2019-11-15 ENCOUNTER — Encounter: Payer: Self-pay | Admitting: Ophthalmology

## 2019-11-15 ENCOUNTER — Encounter
Admission: RE | Disposition: A | Discharge status: Home / Self Care | Payer: Self-pay | Source: Home / Self Care | Attending: Ophthalmology

## 2019-11-15 ENCOUNTER — Ambulatory Visit
Admission: RE | Admit: 2019-11-15 | Discharge: 2019-11-15 | Disposition: A | Discharge status: Home / Self Care | Payer: PPO | Attending: Ophthalmology | Admitting: Ophthalmology

## 2019-11-15 ENCOUNTER — Other Ambulatory Visit: Payer: Self-pay

## 2019-11-15 DIAGNOSIS — Z79899 Other long term (current) drug therapy: Secondary | ICD-10-CM | POA: Insufficient documentation

## 2019-11-15 DIAGNOSIS — F419 Anxiety disorder, unspecified: Secondary | ICD-10-CM | POA: Diagnosis not present

## 2019-11-15 DIAGNOSIS — H2511 Age-related nuclear cataract, right eye: Secondary | ICD-10-CM | POA: Diagnosis not present

## 2019-11-15 DIAGNOSIS — Z950 Presence of cardiac pacemaker: Secondary | ICD-10-CM | POA: Diagnosis not present

## 2019-11-15 DIAGNOSIS — K219 Gastro-esophageal reflux disease without esophagitis: Secondary | ICD-10-CM | POA: Diagnosis not present

## 2019-11-15 DIAGNOSIS — H25811 Combined forms of age-related cataract, right eye: Secondary | ICD-10-CM | POA: Diagnosis not present

## 2019-11-15 DIAGNOSIS — Z87891 Personal history of nicotine dependence: Secondary | ICD-10-CM | POA: Insufficient documentation

## 2019-11-15 HISTORY — PX: CATARACT EXTRACTION W/PHACO: SHX586

## 2019-11-15 SURGERY — PHACOEMULSIFICATION, CATARACT, WITH IOL INSERTION
Anesthesia: Monitor Anesthesia Care | Site: Eye | Laterality: Right

## 2019-11-15 MED ORDER — BRIMONIDINE TARTRATE-TIMOLOL 0.2-0.5 % OP SOLN
OPHTHALMIC | Status: DC | PRN
  Administered 2019-11-15: 1 [drp] via OPHTHALMIC

## 2019-11-15 MED ORDER — MOXIFLOXACIN HCL 0.5 % OP SOLN
1.0000 [drp] | OPHTHALMIC | Status: DC | PRN
  Administered 2019-11-15 (×3): 1 [drp] via OPHTHALMIC

## 2019-11-15 MED ORDER — TETRACAINE HCL 0.5 % OP SOLN
1.0000 [drp] | OPHTHALMIC | Status: DC | PRN
  Administered 2019-11-15 (×3): 1 [drp] via OPHTHALMIC

## 2019-11-15 MED ORDER — EPINEPHRINE PF 1 MG/ML IJ SOLN
INTRAOCULAR | Status: DC | PRN
  Administered 2019-11-15: 67 mL via OPHTHALMIC

## 2019-11-15 MED ORDER — ARMC OPHTHALMIC DILATING DROPS
1.0000 "application " | OPHTHALMIC | Status: DC | PRN
  Administered 2019-11-15 (×3): 1 via OPHTHALMIC

## 2019-11-15 MED ORDER — CEFUROXIME OPHTHALMIC INJECTION 1 MG/0.1 ML
INJECTION | OPHTHALMIC | Status: DC | PRN
  Administered 2019-11-15: 0.1 mL via INTRACAMERAL

## 2019-11-15 MED ORDER — ACETAMINOPHEN 160 MG/5ML PO SOLN: 325.0000 mg | ORAL | Status: DC | PRN

## 2019-11-15 MED ORDER — LACTATED RINGERS IV SOLN: INTRAVENOUS | Status: DC

## 2019-11-15 MED ORDER — NA HYALUR & NA CHOND-NA HYALUR 0.4-0.35 ML IO KIT
PACK | INTRAOCULAR | Status: DC | PRN
  Administered 2019-11-15: 1 mL via INTRAOCULAR

## 2019-11-15 MED ORDER — MIDAZOLAM HCL 2 MG/2ML IJ SOLN
INTRAMUSCULAR | Status: DC | PRN
  Administered 2019-11-15: 2 mg via INTRAVENOUS

## 2019-11-15 MED ORDER — ACETAMINOPHEN 325 MG PO TABS: 325.0000 mg | ORAL_TABLET | ORAL | Status: DC | PRN

## 2019-11-15 MED ORDER — FENTANYL CITRATE (PF) 100 MCG/2ML IJ SOLN
INTRAMUSCULAR | Status: DC | PRN
  Administered 2019-11-15: 100 ug via INTRAVENOUS

## 2019-11-15 MED ORDER — LIDOCAINE HCL (PF) 2 % IJ SOLN
INTRAOCULAR | Status: DC | PRN
  Administered 2019-11-15: 1 mL

## 2019-11-15 SURGICAL SUPPLY — 28 items
CANNULA ANT/CHMB 27G (MISCELLANEOUS) ×1 IMPLANT
CANNULA ANT/CHMB 27GA (MISCELLANEOUS) ×3 IMPLANT
GLOVE SURG LX 7.5 STRW (GLOVE) ×4
GLOVE SURG LX STRL 7.5 STRW (GLOVE) ×1 IMPLANT
GLOVE SURG TRIUMPH 8.0 PF LTX (GLOVE) ×3 IMPLANT
GOWN STRL REUS W/ TWL LRG LVL3 (GOWN DISPOSABLE) ×2 IMPLANT
GOWN STRL REUS W/TWL LRG LVL3 (GOWN DISPOSABLE) ×6
LENS IOL ACRYSOF IQ POST 29.5 (Intraocular Lens) ×2 IMPLANT
MARKER SKIN DUAL TIP RULER LAB (MISCELLANEOUS) ×3 IMPLANT
NDL CAPSULORHEX 25GA (NEEDLE) ×1 IMPLANT
NDL FILTER BLUNT 18X1 1/2 (NEEDLE) ×2 IMPLANT
NDL RETROBULBAR .5 NSTRL (NEEDLE) IMPLANT
NEEDLE CAPSULORHEX 25GA (NEEDLE) ×3 IMPLANT
NEEDLE FILTER BLUNT 18X 1/2SAF (NEEDLE) ×4
NEEDLE FILTER BLUNT 18X1 1/2 (NEEDLE) ×2 IMPLANT
PACK CATARACT BRASINGTON (MISCELLANEOUS) ×3 IMPLANT
PACK EYE AFTER SURG (MISCELLANEOUS) ×3 IMPLANT
PACK OPTHALMIC (MISCELLANEOUS) ×3 IMPLANT
RING MALYGIN 7.0 (MISCELLANEOUS) IMPLANT
SOLUTION OPHTHALMIC SALT (MISCELLANEOUS) ×3 IMPLANT
SUT ETHILON 10-0 CS-B-6CS-B-6 (SUTURE)
SUT VICRYL  9 0 (SUTURE)
SUT VICRYL 9 0 (SUTURE) IMPLANT
SUTURE EHLN 10-0 CS-B-6CS-B-6 (SUTURE) IMPLANT
SYR 3ML LL SCALE MARK (SYRINGE) ×6 IMPLANT
SYR TB 1ML LUER SLIP (SYRINGE) ×3 IMPLANT
WATER STERILE IRR 250ML POUR (IV SOLUTION) ×3 IMPLANT
WIPE NON LINTING 3.25X3.25 (MISCELLANEOUS) ×3 IMPLANT

## 2019-11-15 NOTE — Anesthesia Procedure Notes (Signed)
Procedure Name: MAC Performed by: Izetta Dakin, CRNA Pre-anesthesia Checklist: Timeout performed, Patient being monitored, Patient identified and Emergency Drugs available Patient Re-evaluated:Patient Re-evaluated prior to induction Oxygen Delivery Method: Nasal cannula

## 2019-11-15 NOTE — Anesthesia Preprocedure Evaluation (Signed)
Anesthesia Evaluation  Patient identified by MRN, date of birth, ID band Patient awake    Reviewed: Allergy & Precautions, H&P , NPO status , Patient's Chart, lab work & pertinent test results, reviewed documented beta blocker date and time   History of Anesthesia Complications (+) PONV and history of anesthetic complications  Airway Mallampati: II  TM Distance: >3 FB Neck ROM: full    Dental no notable dental hx.    Pulmonary neg pulmonary ROS, former smoker,    Pulmonary exam normal breath sounds clear to auscultation       Cardiovascular Exercise Tolerance: Good + pacemaker  Rhythm:regular Rate:Normal     Neuro/Psych Anxiety negative neurological ROS     GI/Hepatic Neg liver ROS, GERD  Controlled,  Endo/Other  negative endocrine ROS  Renal/GU negative Renal ROS  negative genitourinary   Musculoskeletal   Abdominal   Peds  Hematology negative hematology ROS (+)   Anesthesia Other Findings   Reproductive/Obstetrics negative OB ROS                             Anesthesia Physical Anesthesia Plan  ASA: II  Anesthesia Plan: MAC   Post-op Pain Management:    Induction:   PONV Risk Score and Plan:   Airway Management Planned:   Additional Equipment:   Intra-op Plan:   Post-operative Plan:   Informed Consent: I have reviewed the patients History and Physical, chart, labs and discussed the procedure including the risks, benefits and alternatives for the proposed anesthesia with the patient or authorized representative who has indicated his/her understanding and acceptance.     Dental Advisory Given  Plan Discussed with: CRNA and Anesthesiologist  Anesthesia Plan Comments:         Anesthesia Quick Evaluation

## 2019-11-15 NOTE — Op Note (Signed)
LOCATION:  [place]   PREOPERATIVE DIAGNOSIS:    Nuclear sclerotic cataract right eye. H25.11   POSTOPERATIVE DIAGNOSIS:  Nuclear sclerotic cataract right eye.     PROCEDURE:  Phacoemusification with posterior chamber intraocular lens placement of the right eye   ULTRASOUND TIME: Procedure(s) with comments: CATARACT EXTRACTION PHACO AND INTRAOCULAR LENS PLACEMENT (IOC) RIGHT (Right) - 8.61 1:12.4 11.9%  LENS:   Implant Name Type Inv. Item Serial No. Manufacturer Lot No. LRB No. Used Action  LENS IOL 1 PIECE DIOP 29.5 - [id number] Intraocular Lens LENS IOL 1 PIECE DIOP 29.5 [id number] ALCON  Right 1 Implanted         SURGEON:  Wyonia Hough, MD   ANESTHESIA:  Topical with tetracaine drops and 2% Xylocaine jelly, augmented with 1% preservative-free intracameral lidocaine.    COMPLICATIONS:  None.   DESCRIPTION OF PROCEDURE:  The patient was identified in the holding room and transported to the operating room and placed in the supine position under the operating microscope.  The right eye was identified as the operative eye and it was prepped and draped in the usual sterile ophthalmic fashion.   A 1 millimeter clear-corneal paracentesis was made at the 12:00 position.  0.5 ml of preservative-free 1% lidocaine was injected into the anterior chamber. The anterior chamber was filled with Viscoat viscoelastic.  A 2.4 millimeter keratome was used to make a near-clear corneal incision at the 9:00 position.  A curvilinear capsulorrhexis was made with a cystotome and capsulorrhexis forceps.  Balanced salt solution was used to hydrodissect and hydrodelineate the nucleus.   Phacoemulsification was then used in stop and chop fashion to remove the lens nucleus and epinucleus.  The remaining cortex was then removed using the irrigation and aspiration handpiece. Provisc was then placed into the capsular bag to distend it for lens placement.  A lens was then injected into the  capsular bag.  The remaining viscoelastic was aspirated.   Wounds were hydrated with balanced salt solution.  The anterior chamber was inflated to a physiologic pressure with balanced salt solution.  No wound leaks were noted. Cefuroxime 0.1 ml of a 10mg /ml solution was injected into the anterior chamber for a dose of 1 mg of intracameral antibiotic at the completion of the case.   Timolol and Brimonidine drops were applied to the eye.  The patient was taken to the recovery room in stable condition without complications of anesthesia or surgery.   Cace Osorto 11/15/2019, 10:59 AM

## 2019-11-15 NOTE — H&P (Signed)

## 2019-11-15 NOTE — Transfer of Care (Signed)
Immediate Anesthesia Transfer of Care Note  Patient: Natalie Rosales  Procedure(s) Performed: CATARACT EXTRACTION PHACO AND INTRAOCULAR LENS PLACEMENT (IOC) RIGHT (Right Eye)  Patient Location: PACU  Anesthesia Type: MAC  Level of Consciousness: awake, alert  and patient cooperative  Airway and Oxygen Therapy: Patient Spontanous Breathing and Patient connected to supplemental oxygen  Post-op Assessment: Post-op Vital signs reviewed, Patient's Cardiovascular Status Stable, Respiratory Function Stable, Patent Airway and No signs of Nausea or vomiting  Post-op Vital Signs: Reviewed and stable  Complications: No complications documented.

## 2019-11-15 NOTE — Anesthesia Postprocedure Evaluation (Signed)
Anesthesia Post Note  Patient: JALIANA MEDELLIN  Procedure(s) Performed: CATARACT EXTRACTION PHACO AND INTRAOCULAR LENS PLACEMENT (IOC) RIGHT (Right Eye)     Patient location during evaluation: PACU Anesthesia Type: MAC Level of consciousness: awake and alert Pain management: pain level controlled Vital Signs Assessment: post-procedure vital signs reviewed and stable Respiratory status: spontaneous breathing, nonlabored ventilation, respiratory function stable and patient connected to nasal cannula oxygen Cardiovascular status: stable and blood pressure returned to baseline Postop Assessment: no apparent nausea or vomiting Anesthetic complications: no   No complications documented.  Trecia Rogers

## 2019-11-16 ENCOUNTER — Encounter: Payer: Self-pay | Admitting: Ophthalmology

## 2019-12-01 DIAGNOSIS — H2512 Age-related nuclear cataract, left eye: Secondary | ICD-10-CM | POA: Diagnosis not present

## 2019-12-06 ENCOUNTER — Encounter: Payer: Self-pay | Admitting: Ophthalmology

## 2019-12-06 ENCOUNTER — Other Ambulatory Visit: Payer: Self-pay

## 2019-12-11 ENCOUNTER — Other Ambulatory Visit: Payer: Self-pay

## 2019-12-11 ENCOUNTER — Other Ambulatory Visit
Admission: RE | Admit: 2019-12-11 | Discharge: 2019-12-11 | Disposition: A | Discharge status: Home / Self Care | Payer: PPO | Source: Ambulatory Visit | Attending: Ophthalmology | Admitting: Ophthalmology

## 2019-12-11 DIAGNOSIS — Z01812 Encounter for preprocedural laboratory examination: Secondary | ICD-10-CM | POA: Insufficient documentation

## 2019-12-11 DIAGNOSIS — Z20822 Contact with and (suspected) exposure to covid-19: Secondary | ICD-10-CM | POA: Diagnosis not present

## 2019-12-11 LAB — SARS CORONAVIRUS 2 (TAT 6-24 HRS): SARS Coronavirus 2: NEGATIVE

## 2019-12-11 NOTE — Discharge Instructions (Signed)

## 2019-12-13 ENCOUNTER — Encounter
Admission: RE | Disposition: A | Discharge status: Home / Self Care | Payer: Self-pay | Source: Home / Self Care | Attending: Ophthalmology

## 2019-12-13 ENCOUNTER — Ambulatory Visit: Payer: PPO | Admitting: Anesthesiology

## 2019-12-13 ENCOUNTER — Encounter: Payer: Self-pay | Admitting: Ophthalmology

## 2019-12-13 ENCOUNTER — Ambulatory Visit
Admission: RE | Admit: 2019-12-13 | Discharge: 2019-12-13 | Disposition: A | Discharge status: Home / Self Care | Payer: PPO | Attending: Ophthalmology | Admitting: Ophthalmology

## 2019-12-13 ENCOUNTER — Other Ambulatory Visit: Payer: Self-pay

## 2019-12-13 DIAGNOSIS — M1990 Unspecified osteoarthritis, unspecified site: Secondary | ICD-10-CM | POA: Diagnosis not present

## 2019-12-13 DIAGNOSIS — Z950 Presence of cardiac pacemaker: Secondary | ICD-10-CM | POA: Insufficient documentation

## 2019-12-13 DIAGNOSIS — Z9849 Cataract extraction status, unspecified eye: Secondary | ICD-10-CM | POA: Diagnosis not present

## 2019-12-13 DIAGNOSIS — Z8541 Personal history of malignant neoplasm of cervix uteri: Secondary | ICD-10-CM | POA: Diagnosis not present

## 2019-12-13 DIAGNOSIS — M810 Age-related osteoporosis without current pathological fracture: Secondary | ICD-10-CM | POA: Diagnosis not present

## 2019-12-13 DIAGNOSIS — Z791 Long term (current) use of non-steroidal anti-inflammatories (NSAID): Secondary | ICD-10-CM | POA: Diagnosis not present

## 2019-12-13 DIAGNOSIS — Z79899 Other long term (current) drug therapy: Secondary | ICD-10-CM | POA: Diagnosis not present

## 2019-12-13 DIAGNOSIS — E7800 Pure hypercholesterolemia, unspecified: Secondary | ICD-10-CM | POA: Diagnosis not present

## 2019-12-13 DIAGNOSIS — F419 Anxiety disorder, unspecified: Secondary | ICD-10-CM | POA: Diagnosis not present

## 2019-12-13 DIAGNOSIS — Z923 Personal history of irradiation: Secondary | ICD-10-CM | POA: Diagnosis not present

## 2019-12-13 DIAGNOSIS — H2512 Age-related nuclear cataract, left eye: Secondary | ICD-10-CM | POA: Diagnosis not present

## 2019-12-13 DIAGNOSIS — Z87891 Personal history of nicotine dependence: Secondary | ICD-10-CM | POA: Insufficient documentation

## 2019-12-13 DIAGNOSIS — H25812 Combined forms of age-related cataract, left eye: Secondary | ICD-10-CM | POA: Diagnosis not present

## 2019-12-13 DIAGNOSIS — Z853 Personal history of malignant neoplasm of breast: Secondary | ICD-10-CM | POA: Diagnosis not present

## 2019-12-13 DIAGNOSIS — Z7983 Long term (current) use of bisphosphonates: Secondary | ICD-10-CM | POA: Diagnosis not present

## 2019-12-13 HISTORY — PX: CATARACT EXTRACTION W/PHACO: SHX586

## 2019-12-13 SURGERY — PHACOEMULSIFICATION, CATARACT, WITH IOL INSERTION
Anesthesia: Monitor Anesthesia Care | Site: Eye | Laterality: Left

## 2019-12-13 MED ORDER — MIDAZOLAM HCL 2 MG/2ML IJ SOLN
INTRAMUSCULAR | Status: DC | PRN
  Administered 2019-12-13: 2 mg via INTRAVENOUS

## 2019-12-13 MED ORDER — FENTANYL CITRATE (PF) 100 MCG/2ML IJ SOLN
INTRAMUSCULAR | Status: DC | PRN
  Administered 2019-12-13: 50 ug via INTRAVENOUS

## 2019-12-13 MED ORDER — ARMC OPHTHALMIC DILATING DROPS
1.0000 "application " | OPHTHALMIC | Status: DC | PRN
  Administered 2019-12-13 (×3): 1 via OPHTHALMIC

## 2019-12-13 MED ORDER — CEFUROXIME OPHTHALMIC INJECTION 1 MG/0.1 ML
INJECTION | OPHTHALMIC | Status: DC | PRN
  Administered 2019-12-13: 0.1 mL via INTRACAMERAL

## 2019-12-13 MED ORDER — MOXIFLOXACIN HCL 0.5 % OP SOLN
1.0000 [drp] | OPHTHALMIC | Status: DC | PRN
  Administered 2019-12-13 (×3): 1 [drp] via OPHTHALMIC

## 2019-12-13 MED ORDER — LIDOCAINE HCL (PF) 2 % IJ SOLN
INTRAOCULAR | Status: DC | PRN
  Administered 2019-12-13: 1 mL

## 2019-12-13 MED ORDER — TETRACAINE HCL 0.5 % OP SOLN
1.0000 [drp] | OPHTHALMIC | Status: DC | PRN
  Administered 2019-12-13 (×3): 1 [drp] via OPHTHALMIC

## 2019-12-13 MED ORDER — ONDANSETRON HCL 4 MG/2ML IJ SOLN
INTRAMUSCULAR | Status: DC | PRN
  Administered 2019-12-13: 4 mg via INTRAVENOUS

## 2019-12-13 MED ORDER — BRIMONIDINE TARTRATE-TIMOLOL 0.2-0.5 % OP SOLN
OPHTHALMIC | Status: DC | PRN
  Administered 2019-12-13: 1 [drp] via OPHTHALMIC

## 2019-12-13 MED ORDER — EPINEPHRINE PF 1 MG/ML IJ SOLN
INTRAOCULAR | Status: DC | PRN
  Administered 2019-12-13: 69 mL via OPHTHALMIC

## 2019-12-13 MED ORDER — NA HYALUR & NA CHOND-NA HYALUR 0.4-0.35 ML IO KIT
PACK | INTRAOCULAR | Status: DC | PRN
  Administered 2019-12-13: 1 mL via INTRAOCULAR

## 2019-12-13 MED ORDER — LACTATED RINGERS IV SOLN: INTRAVENOUS | Status: DC

## 2019-12-13 SURGICAL SUPPLY — 30 items
CANNULA ANT/CHMB 27G (MISCELLANEOUS) ×1 IMPLANT
CANNULA ANT/CHMB 27GA (MISCELLANEOUS) ×3 IMPLANT
GLOVE SURG LX 7.5 STRW (GLOVE) ×4
GLOVE SURG LX STRL 7.5 STRW (GLOVE) ×1 IMPLANT
GLOVE SURG TRIUMPH 8.0 PF LTX (GLOVE) ×3 IMPLANT
GOWN STRL REUS W/ TWL LRG LVL3 (GOWN DISPOSABLE) ×2 IMPLANT
GOWN STRL REUS W/TWL LRG LVL3 (GOWN DISPOSABLE) ×6
LENS IOL ACRSF IQ TRC 3 26.0 IMPLANT
LENS IOL ACRYSOF IQ TORIC 26.0 ×3 IMPLANT
LENS IOL IQ TORIC 3 26.0 ×1 IMPLANT
MARKER SKIN DUAL TIP RULER LAB (MISCELLANEOUS) ×3 IMPLANT
NDL CAPSULORHEX 25GA (NEEDLE) ×1 IMPLANT
NDL FILTER BLUNT 18X1 1/2 (NEEDLE) ×2 IMPLANT
NDL RETROBULBAR .5 NSTRL (NEEDLE) IMPLANT
NEEDLE CAPSULORHEX 25GA (NEEDLE) ×3 IMPLANT
NEEDLE FILTER BLUNT 18X 1/2SAF (NEEDLE) ×4
NEEDLE FILTER BLUNT 18X1 1/2 (NEEDLE) ×2 IMPLANT
PACK CATARACT BRASINGTON (MISCELLANEOUS) ×3 IMPLANT
PACK EYE AFTER SURG (MISCELLANEOUS) ×3 IMPLANT
PACK OPTHALMIC (MISCELLANEOUS) ×3 IMPLANT
RING MALYGIN 7.0 (MISCELLANEOUS) IMPLANT
SOLUTION OPHTHALMIC SALT (MISCELLANEOUS) ×3 IMPLANT
SUT ETHILON 10-0 CS-B-6CS-B-6 (SUTURE)
SUT VICRYL  9 0 (SUTURE)
SUT VICRYL 9 0 (SUTURE) IMPLANT
SUTURE EHLN 10-0 CS-B-6CS-B-6 (SUTURE) IMPLANT
SYR 3ML LL SCALE MARK (SYRINGE) ×6 IMPLANT
SYR TB 1ML LUER SLIP (SYRINGE) ×3 IMPLANT
WATER STERILE IRR 250ML POUR (IV SOLUTION) ×3 IMPLANT
WIPE NON LINTING 3.25X3.25 (MISCELLANEOUS) ×3 IMPLANT

## 2019-12-13 NOTE — Anesthesia Postprocedure Evaluation (Signed)
Anesthesia Post Note  Patient: Natalie Rosales  Procedure(s) Performed: CATARACT EXTRACTION PHACO AND INTRAOCULAR LENS PLACEMENT (IOC) LEFT TORIC LENS (Left Eye)     Anesthesia Post Evaluation No complications documented.  Nishat Livingston [employer]

## 2019-12-13 NOTE — H&P (Signed)

## 2019-12-13 NOTE — Anesthesia Preprocedure Evaluation (Signed)
Anesthesia Evaluation  Patient identified by MRN, date of birth, ID band Patient awake    Reviewed: Allergy & Precautions, H&P , NPO status , Patient's Chart, lab work & pertinent test results, reviewed documented beta blocker date and time   History of Anesthesia Complications (+) PONV and history of anesthetic complications  Airway Mallampati: II  TM Distance: >3 FB Neck ROM: full    Dental no notable dental hx.    Pulmonary former smoker,    Pulmonary exam normal breath sounds clear to auscultation       Cardiovascular Exercise Tolerance: Good Normal cardiovascular exam+ pacemaker  Rhythm:regular Rate:Normal     Neuro/Psych Anxiety negative neurological ROS     GI/Hepatic Neg liver ROS, GERD  Controlled,  Endo/Other  negative endocrine ROS  Renal/GU negative Renal ROS  negative genitourinary   Musculoskeletal  (+) Arthritis , Osteoarthritis,    Abdominal Normal abdominal exam  (+)   Peds  Hematology negative hematology ROS (+)   Anesthesia Other Findings   Reproductive/Obstetrics negative OB ROS                             Anesthesia Physical  Anesthesia Plan  ASA: II  Anesthesia Plan: MAC   Post-op Pain Management:    Induction: Intravenous  PONV Risk Score and Plan: 3 and Ondansetron, TIVA, Midazolam and Treatment may vary due to age or medical condition  Airway Management Planned: Nasal Cannula  Additional Equipment: None  Intra-op Plan:   Post-operative Plan:   Informed Consent: I have reviewed the patients History and Physical, chart, labs and discussed the procedure including the risks, benefits and alternatives for the proposed anesthesia with the patient or authorized representative who has indicated his/her understanding and acceptance.     Dental advisory given  Plan Discussed with: CRNA  Anesthesia Plan Comments:         Anesthesia Quick  Evaluation

## 2019-12-13 NOTE — Op Note (Signed)
LOCATION:  [place]   PREOPERATIVE DIAGNOSIS:  Nuclear sclerotic cataract of the left eye.  H25.12  POSTOPERATIVE DIAGNOSIS:  Nuclear sclerotic cataract of the left eye.   PROCEDURE:  Phacoemulsification with Toric posterior chamber intraocular lens placement of the left eye.  Ultrasound time: Procedure(s) with comments: CATARACT EXTRACTION PHACO AND INTRAOCULAR LENS PLACEMENT (IOC) LEFT TORIC LENS (Left) - 9.12 1:13.5 12.4% LENS:[id number] 26.0 D Toric intraocular lens with 1.5 diopters of cylindrical power with axis orientation at 41 degrees.   SURGEON:  Wyonia Hough, MD   ANESTHESIA:  Topical with tetracaine drops and 2% Xylocaine jelly, augmented with 1% preservative-free intracameral lidocaine.  COMPLICATIONS:  None.   DESCRIPTION OF PROCEDURE:  The patient was identified in the holding room and transported to the operating suite and placed in the supine position under the operating microscope.  The left eye was identified as the operative eye, and it was prepped and draped in the usual sterile ophthalmic fashion.    A clear-corneal paracentesis incision was made at the 1:30 position.  0.5 ml of preservative-free 1% lidocaine was injected into the anterior chamber. The anterior chamber was filled with Viscoat.  A 2.4 millimeter near clear corneal incision was then made at the 10:30 position.  A cystotome and capsulorrhexis forceps were then used to make a curvilinear capsulorrhexis.  Hydrodissection and hydrodelineation were then performed using balanced salt solution.   Phacoemulsification was then used in stop and chop fashion to remove the lens, nucleus and epinucleus.  The remaining cortex was aspirated using the irrigation and aspiration handpiece.  Provisc viscoelastic was then placed into the capsular bag to distend it for lens placement.  The Verion digital marker was used to align the implant at the intended axis.   A 26.0 diopter lens was then injected into  the capsular bag.  It was rotated clockwise until the axis marks on the lens were approximately 15 degrees in the counterclockwise direction to the intended alignment.  The viscoelastic was aspirated from the eye using the irrigation aspiration handpiece.  Then, a Koch spatula through the sideport incision was used to rotate the lens in a clockwise direction until the axis markings of the intraocular lens were lined up with the Verion alignment.  Balanced salt solution was then used to hydrate the wounds. Cefuroxime 0.1 ml of a 10mg /ml solution was injected into the anterior chamber for a dose of 1 mg of intracameral antibiotic at the completion of the case.    The eye was noted to have a physiologic pressure and there was no wound leak noted.   Timolol and Brimonidine drops were applied to the eye.  The patient was taken to the recovery room in stable condition having had no complications of anesthesia or surgery.  Leda Bellefeuille 12/13/2019, 9:20 AM

## 2019-12-13 NOTE — Anesthesia Procedure Notes (Signed)
Procedure Name: MAC Performed by: Wetona Viramontes, CRNA Pre-anesthesia Checklist: Patient identified, Emergency Drugs available, Suction available, Timeout performed and Patient being monitored Patient Re-evaluated:Patient Re-evaluated prior to induction Oxygen Delivery Method: Nasal cannula Placement Confirmation: positive ETCO2       

## 2019-12-13 NOTE — Transfer of Care (Signed)
Immediate Anesthesia Transfer of Care Note  Patient: Natalie Rosales  Procedure(s) Performed: CATARACT EXTRACTION PHACO AND INTRAOCULAR LENS PLACEMENT (IOC) LEFT TORIC LENS (Left Eye)  Patient Location: PACU  Anesthesia Type: MAC  Level of Consciousness: awake, alert  and patient cooperative  Airway and Oxygen Therapy: Patient Spontanous Breathing and Patient connected to supplemental oxygen  Post-op Assessment: Post-op Vital signs reviewed, Patient's Cardiovascular Status Stable, Respiratory Function Stable, Patent Airway and No signs of Nausea or vomiting  Post-op Vital Signs: Reviewed and stable  Complications: No complications documented.

## 2019-12-14 ENCOUNTER — Encounter: Payer: Self-pay | Admitting: Ophthalmology

## 2020-01-24 DIAGNOSIS — Z961 Presence of intraocular lens: Secondary | ICD-10-CM | POA: Diagnosis not present

## 2020-04-09 DIAGNOSIS — I442 Atrioventricular block, complete: Secondary | ICD-10-CM | POA: Diagnosis not present

## 2020-05-20 DIAGNOSIS — Z950 Presence of cardiac pacemaker: Secondary | ICD-10-CM | POA: Diagnosis not present

## 2020-05-20 DIAGNOSIS — I493 Ventricular premature depolarization: Secondary | ICD-10-CM | POA: Diagnosis not present

## 2020-05-20 DIAGNOSIS — I471 Supraventricular tachycardia: Secondary | ICD-10-CM | POA: Diagnosis not present

## 2020-05-20 DIAGNOSIS — I208 Other forms of angina pectoris: Secondary | ICD-10-CM | POA: Diagnosis not present

## 2020-05-20 DIAGNOSIS — R002 Palpitations: Secondary | ICD-10-CM | POA: Diagnosis not present

## 2020-05-20 DIAGNOSIS — E782 Mixed hyperlipidemia: Secondary | ICD-10-CM | POA: Diagnosis not present

## 2020-05-20 DIAGNOSIS — I442 Atrioventricular block, complete: Secondary | ICD-10-CM | POA: Diagnosis not present

## 2020-05-28 DIAGNOSIS — I208 Other forms of angina pectoris: Secondary | ICD-10-CM | POA: Diagnosis not present

## 2020-06-17 DIAGNOSIS — L570 Actinic keratosis: Secondary | ICD-10-CM | POA: Diagnosis not present

## 2020-06-20 ENCOUNTER — Other Ambulatory Visit: Payer: Self-pay | Admitting: Family Medicine

## 2020-07-02 DIAGNOSIS — L570 Actinic keratosis: Secondary | ICD-10-CM | POA: Diagnosis not present

## 2020-07-03 DIAGNOSIS — I493 Ventricular premature depolarization: Secondary | ICD-10-CM | POA: Diagnosis not present

## 2020-07-03 DIAGNOSIS — E782 Mixed hyperlipidemia: Secondary | ICD-10-CM | POA: Diagnosis not present

## 2020-07-03 DIAGNOSIS — I471 Supraventricular tachycardia: Secondary | ICD-10-CM | POA: Diagnosis not present

## 2020-07-03 DIAGNOSIS — I442 Atrioventricular block, complete: Secondary | ICD-10-CM | POA: Diagnosis not present

## 2020-07-04 NOTE — Progress Notes (Signed)
Subjective:   Natalie Rosales is a 75 y.o. female who presents for Medicare Annual (Subsequent) preventive examination.  I connected with Orson Aloe today by telephone and verified that I am speaking with the correct person using two identifiers. Location patient: home Location provider: work Persons participating in the virtual visit: patient, provider.   I discussed the limitations, risks, security and privacy concerns of performing an evaluation and management service by telephone and the availability of in person appointments. I also discussed with the patient that there may be a patient responsible charge related to this service. The patient expressed understanding and verbally consented to this telephonic visit.    Interactive audio and video telecommunications were attempted between this provider and patient, however failed, due to patient having technical difficulties OR patient did not have access to video capability.  We continued and completed visit with audio only.   Review of Systems    N/A  Cardiac Risk Factors include: advanced age (>60men, >34 women);dyslipidemia     Objective:    There were no vitals filed for this visit. There is no height or weight on file to calculate BMI.  Advanced Directives 07/08/2020 12/13/2019 11/15/2019 05/23/2019 05/16/2018 06/10/2015  Does Patient Have a Medical Advance Directive? No No No No No No  Would patient like information on creating a medical advance directive? No - Patient declined No - Patient declined No - Patient declined No - Patient declined No - Patient declined No - patient declined information    Current Medications (verified) Outpatient Encounter Medications as of 07/08/2020  Medication Sig  . alendronate (FOSAMAX) 70 MG tablet TAKE 1 TABLET BY MOUTH EVERY 7 (SEVEN) DAYS. TAKE WITH A FULL GLASS OF WATER ON AN EMPTY STOMACH.  Marland Kitchen ALPRAZolam (XANAX) 0.25 MG tablet Take 1 tablet (0.25 mg total) by mouth at bedtime as needed  for anxiety.  . celecoxib (CELEBREX) 200 MG capsule Take 1 capsule (200 mg total) by mouth daily.  . Collagen-Vitamin C-Biotin (COLLAGEN 1500/C PO) Take 1 tablet by mouth daily.  . mometasone (NASONEX) 50 MCG/ACT nasal spray Place 2 sprays into the nose daily.  Marland Kitchen omeprazole (PRILOSEC) 20 MG capsule Take 1 capsule (20 mg total) by mouth daily. As needed only  . gabapentin (NEURONTIN) 300 MG capsule Take 2 capsules (600 mg total) by mouth 2 (two) times daily. (Patient not taking: Reported on 07/08/2020)  . rosuvastatin (CRESTOR) 5 MG tablet TAKE 1 TABLET BY MOUTH EVERY OTHER DAY (Patient not taking: Reported on 07/08/2020)  . tiZANidine (ZANAFLEX) 4 MG tablet Take 2-4 mg by mouth at bedtime as needed. (Patient not taking: No sig reported)  . valACYclovir (VALTREX) 1000 MG tablet Take 500 mg by mouth 2 (two) times daily. As needed (Patient not taking: Reported on 07/08/2020)   No facility-administered encounter medications on file as of 07/08/2020.    Allergies (verified) Peanut-containing drug products and Eggs or egg-derived products   History: Past Medical History:  Diagnosis Date  . Allergy   . Anxiety   . Arthritis    back  . Back pain    lower back  . Breast cancer ([place]) 2005    left breast ca, Lumpectomy, f/u with radiation   . Complete heart block ([place])    Pacemaker placed 2005  . Hyperlipidemia   . Personal history of radiation therapy   . PONV (postoperative nausea and vomiting)   . Presence of permanent cardiac pacemaker 06/05/2013   Adapta DR [id number]  . Wears contact  lenses   . Wears hearing aid    bilateral   Past Surgical History:  Procedure Laterality Date  . ABDOMINAL HYSTERECTOMY    . APPENDECTOMY  1982  . BREAST LUMPECTOMY Left 2005   f/u radiation   . CARDIAC PACEMAKER PLACEMENT  2005  . CATARACT EXTRACTION W/PHACO Right 11/15/2019   Procedure: CATARACT EXTRACTION PHACO AND INTRAOCULAR LENS PLACEMENT ([place]) RIGHT;  Surgeon: Leandrew Koyanagi, MD;  Location:  [facility];  Service: Ophthalmology;  Laterality: Right;  8.61 1:12.4 11.9%  . CATARACT EXTRACTION W/PHACO Left 12/13/2019   Procedure: CATARACT EXTRACTION PHACO AND INTRAOCULAR LENS PLACEMENT ([place]) LEFT TORIC LENS;  Surgeon: Leandrew Koyanagi, MD;  Location: [facility];  Service: Ophthalmology;  Laterality: Left;  9.12 1:13.5 12.4%  . CHOLECYSTECTOMY  1982  . COLONOSCOPY WITH PROPOFOL N/A 06/10/2015   Procedure: COLONOSCOPY WITH PROPOFOL;  Surgeon: Lucilla Lame, MD;  Location: [facility];  Service: Endoscopy;  Laterality: N/A;  . HIP FRACTURE SURGERY Right 2012   Three pins    Family History  Problem Relation Age of Onset  . Cancer Mother   . Cancer Father        Lymphoma or Pancreatic Cancer  . Pancreatic cancer Father   . Lymphoma Father   . Heart disease Sister   . Breast cancer Sister   . Bladder Cancer Brother   . Lymphoma Brother   . Breast cancer Cousin   . Esophageal cancer Niece    Social History   Socioeconomic History  . Marital status: Married    Spouse name: [name]  . Number of children: 3  . Years of education: College  . Highest education level: Associate degree: academic program  Occupational History  . 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  Tobacco Use  . Smoking status: Former Smoker    Types: Cigarettes    Quit date: 2005    Years since quitting: 17.2  . Smokeless tobacco: Never Used  . Tobacco comment: quit 2005 (was social smoker only)  Vaping Use  . Vaping Use: Never used  Substance and Sexual Activity  . Alcohol use: Not Currently  . Drug use: No  . Sexual activity: Not Currently  Other Topics Concern  . Not on file  Social History Narrative  . Not on file   Social Determinants of Health   Financial Resource Strain: Low Risk   . Difficulty of Paying Living Expenses: Not hard at all  Food Insecurity: No Food Insecurity  . Worried About Charity fundraiser in the Last Year: Never true  . Ran  Out of Food in the Last Year: Never true  Transportation Needs: No Transportation Needs  . Lack of Transportation (Medical): No  . Lack of Transportation (Non-Medical): No  Physical Activity: Sufficiently Active  . Days of Exercise per Week: 3 days  . Minutes of Exercise per Session: 60 min  Stress: No Stress Concern Present  . Feeling of Stress : Not at all  Social Connections: Moderately Isolated  . Frequency of Communication with Friends and Family: More than three times a week  . Frequency of Social Gatherings with Friends and Family: More than three times a week  . Attends Religious Services: Never  . Active Member of Clubs or Organizations: No  . Attends Archivist Meetings: Never  . Marital Status: Married    Tobacco Counseling Counseling given: Not Answered Comment: quit 2005 (was social smoker only)   Clinical Intake:  Pre-visit preparation completed: Yes  Pain : No/denies pain     Nutritional Risks: None Diabetes: No  How often do you need to have someone help you when you read instructions, pamphlets, or other written materials from your doctor or pharmacy?: 1 - Never  Diabetic? No  Interpreter Needed?: No  Information entered by :: [facility], LPN   Activities of Daily Living In your present state of health, do you have any difficulty performing the following activities: 07/08/2020 12/13/2019  Hearing? Y N  Comment Wears bilateral hearing aids. -  Vision? N N  Difficulty concentrating or making decisions? N N  Walking or climbing stairs? N N  Dressing or bathing? N N  Doing errands, shopping? N -  Preparing Food and eating ? N -  Using the Toilet? N -  In the past six months, have you accidently leaked urine? N -  Do you have problems with loss of bowel control? N -  Managing your Medications? N -  Managing your Finances? N -  Housekeeping or managing your Housekeeping? N -  Some recent data might be hidden    Patient Care  Team: Virginia Crews, MD as PCP - General (Family Medicine) Corey Skains, MD as Consulting Physician (Cardiology) Leandrew Koyanagi, MD as Referring Physician (Ophthalmology) Jannet Mantis, MD (Dermatology)  Indicate any recent Medical Services you may have received from other than [facility] providers in the past year (date may be approximate).     Assessment:   This is a routine wellness examination for [place].  Hearing/Vision screen No exam data present  Dietary issues and exercise activities discussed: Current Exercise Habits: Home exercise routine, Type of exercise: walking;treadmill, Time (Minutes): 60, Frequency (Times/Week): 3 (to 4 days a week), Weekly Exercise (Minutes/Week): 180, Intensity: Mild, Exercise limited by: None identified  Goals    . DIET - INCREASE WATER INTAKE     Recommend to drink at least 6-8 8oz glasses of water per day.      Depression Screen PHQ 2/9 Scores 07/08/2020 05/23/2019 05/23/2019 10/03/2018 05/16/2018 11/15/2017 09/27/2017  PHQ - 2 Score 0 0 0 0 0 0 2  PHQ- 9 Score - - - 3 - 4 6    Fall Risk Fall Risk  07/08/2020 05/23/2019 05/16/2018 03/04/2018 03/03/2017  Falls in the past year? 0 0 0 0 No  Number falls in past yr: 0 0 - - -  Injury with Fall? 0 0 - - -  Follow up Falls prevention discussed - - - -    FALL RISK PREVENTION PERTAINING TO THE HOME:  Any stairs in or around the home? Yes  If so, are there any without handrails? No  Home free of loose throw rugs in walkways, pet beds, electrical cords, etc? Yes  Adequate lighting in your home to reduce risk of falls? Yes   ASSISTIVE DEVICES UTILIZED TO PREVENT FALLS:  Life alert? No  Use of a cane, walker or w/c? No  Grab bars in the bathroom? No  Shower chair or bench in shower? No  Elevated toilet seat or a handicapped toilet? No    Cognitive Function: Normal cognitive status assessed by observation by this Nurse Health Advisor. No abnormalities found.           Immunizations Immunization History  Administered Date(s) Administered  . PFIZER(Purple Top)SARS-COV-2 Vaccination 05/26/2019, 06/16/2019, 02/19/2020  . Pneumococcal Conjugate-13 11/26/2014  . Pneumococcal Polysaccharide-23 01/13/2016  . Tdap 03/03/2017    TDAP status: Up to date  Pneumococcal vaccine status: Up to date  Covid-19 vaccine status: Completed vaccines  Qualifies for Shingles Vaccine? Yes   Zostavax completed No   Shingrix Completed?: No.    Education has been provided regarding the importance of this vaccine. Patient has been advised to call insurance company to determine out of pocket expense if they have not yet received this vaccine. Advised may also receive vaccine at local pharmacy or Health Dept. Verbalized acceptance and understanding.  Screening Tests Health Maintenance  Topic Date Due  . MAMMOGRAM  07/16/2020  . DEXA SCAN  07/16/2021  . COLONOSCOPY (Pts 45-17yrs Insurance coverage will need to be confirmed)  06/09/2025  . TETANUS/TDAP  03/04/2027  . COVID-19 Vaccine  Completed  . Hepatitis C Screening  Completed  . PNA vac Low Risk Adult  Completed  . HPV VACCINES  Aged Out    Health Maintenance  There are no preventive care reminders to display for this patient.  Colorectal cancer screening: Type of screening: Colonoscopy. Completed 06/10/15. Repeat every 10 years  Mammogram status: Completed 07/17/19. Repeat every year. Pt plans to call and schedule apt for the end of this month.   Bone Density status: Completed 07/17/19. Results reflect: Bone density results: OSTEOPOROSIS. Repeat every 2 years.  Lung Cancer Screening: (Low Dose CT Chest recommended if Age 70-80 years, 30 pack-year currently smoking OR have quit w/in 15years.) does not qualify.   Additional Screening:  Hepatitis C Screening: Up to date  Vision Screening: Recommended annual ophthalmology exams for early detection of glaucoma and other disorders of the eye. Is the patient up to  date with their annual eye exam?  Yes  Who is the provider or what is the name of the office in which the patient attends annual eye exams? Dr Wallace Going @ [facility] If pt is not established with a provider, would they like to be referred to a provider to establish care? No .   Dental Screening: Recommended annual dental exams for proper oral hygiene  Community Resource Referral / Chronic Care Management: CRR required this visit?  No   CCM required this visit?  No      Plan:     I have personally reviewed and noted the following in the patient's chart:   . Medical and social history . Use of alcohol, tobacco or illicit drugs  . Current medications and supplements . Functional ability and status . Nutritional status . Physical activity . Advanced directives . List of other physicians . Hospitalizations, surgeries, and ER visits in previous 12 months . Vitals . Screenings to include cognitive, depression, and falls . Referrals and appointments  In addition, I have reviewed and discussed with patient certain preventive protocols, quality metrics, and best practice recommendations. A written personalized care plan for preventive services as well as general preventive health recommendations were provided to patient.      [place], [place]   05/15/4172   Nurse Notes: None.

## 2020-07-08 ENCOUNTER — Other Ambulatory Visit: Payer: Self-pay

## 2020-07-08 ENCOUNTER — Encounter: Payer: Self-pay | Admitting: Family Medicine

## 2020-07-08 ENCOUNTER — Ambulatory Visit (INDEPENDENT_AMBULATORY_CARE_PROVIDER_SITE_OTHER): Payer: PPO

## 2020-07-08 DIAGNOSIS — Z0000 Encounter for general adult medical examination without abnormal findings: Secondary | ICD-10-CM

## 2020-07-08 NOTE — Patient Instructions (Signed)
Natalie Rosales , Thank you for taking time to come for your Medicare Wellness Visit. I appreciate your ongoing commitment to your health goals. Please review the following plan we discussed and let me know if I can assist you in the future.   Screening recommendations/referrals: Colonoscopy: Up to date, due 05/2025 Mammogram: Up to date, due 07/16/20. Pt plans to call and schedule apt.  Bone Density: Up to date, due 06/2021 Recommended yearly ophthalmology/optometry visit for glaucoma screening and checkup Recommended yearly dental visit for hygiene and checkup  Vaccinations: Pneumococcal vaccine: Completed series Tdap vaccine: Up to date, due 02/2027 Shingles vaccine: Shingrix discussed. Please contact your pharmacy for coverage information.     Advanced directives: Advance directive discussed with you today. Even though you declined this today please call our office should you change your mind and we can give you the proper paperwork for you to fill out.  Conditions/risks identified: Recommend to drink at least 6-8 8oz glasses of water per day.  Next appointment: 07/16/20 @ 11:20 AM with Dr Brita Romp    Preventive Care 3 Years and Older, Female Preventive care refers to lifestyle choices and visits with your health care provider that can promote health and wellness. What does preventive care include?  A yearly physical exam. This is also called an annual well check.  Dental exams once or twice a year.  Routine eye exams. Ask your health care provider how often you should have your eyes checked.  Personal lifestyle choices, including:  Daily care of your teeth and gums.  Regular physical activity.  Eating a healthy diet.  Avoiding tobacco and drug use.  Limiting alcohol use.  Practicing safe sex.  Taking low-dose aspirin every day.  Taking vitamin and mineral supplements as recommended by your health care provider. What happens during an annual well check? The services and  screenings done by your health care provider during your annual well check will depend on your age, overall health, lifestyle risk factors, and family history of disease. Counseling  Your health care provider may ask you questions about your:  Alcohol use.  Tobacco use.  Drug use.  Emotional well-being.  Home and relationship well-being.  Sexual activity.  Eating habits.  History of falls.  Memory and ability to understand (cognition).  Work and work Statistician.  Reproductive health. Screening  You may have the following tests or measurements:  Height, weight, and BMI.  Blood pressure.  Lipid and cholesterol levels. These may be checked every 5 years, or more frequently if you are over 51 years old.  Skin check.  Lung cancer screening. You may have this screening every year starting at age 74 if you have a 30-pack-year history of smoking and currently smoke or have quit within the past 15 years.  Fecal occult blood test (FOBT) of the stool. You may have this test every year starting at age 66.  Flexible sigmoidoscopy or colonoscopy. You may have a sigmoidoscopy every 5 years or a colonoscopy every 10 years starting at age 65.  Hepatitis C blood test.  Hepatitis B blood test.  Sexually transmitted disease (STD) testing.  Diabetes screening. This is done by checking your blood sugar (glucose) after you have not eaten for a while (fasting). You may have this done every 1-3 years.  Bone density scan. This is done to screen for osteoporosis. You may have this done starting at age 46.  Mammogram. This may be done every 1-2 years. Talk to your health care provider about  how often you should have regular mammograms. Talk with your health care provider about your test results, treatment options, and if necessary, the need for more tests. Vaccines  Your health care provider may recommend certain vaccines, such as:  Influenza vaccine. This is recommended every  year.  Tetanus, diphtheria, and acellular pertussis (Tdap, Td) vaccine. You may need a Td booster every 10 years.  Zoster vaccine. You may need this after age 34.  Pneumococcal 13-valent conjugate (PCV13) vaccine. One dose is recommended after age 106.  Pneumococcal polysaccharide (PPSV23) vaccine. One dose is recommended after age 33. Talk to your health care provider about which screenings and vaccines you need and how often you need them. This information is not intended to replace advice given to you by your health care provider. Make sure you discuss any questions you have with your health care provider. Document Released: 05/10/2015 Document Revised: 01/01/2016 Document Reviewed: 02/12/2015 Elsevier Interactive Patient Education  2017 [employer] Prevention in the Home Falls can cause injuries. They can happen to people of all ages. There are many things you can do to make your home safe and to help prevent falls. What can I do on the outside of my home?  Regularly fix the edges of walkways and driveways and fix any cracks.  Remove anything that might make you trip as you walk through a door, such as a raised step or threshold.  Trim any bushes or trees on the path to your home.  Use bright outdoor lighting.  Clear any walking paths of anything that might make someone trip, such as rocks or tools.  Regularly check to see if handrails are loose or broken. Make sure that both sides of any steps have handrails.  Any raised decks and porches should have guardrails on the edges.  Have any leaves, snow, or ice cleared regularly.  Use sand or salt on walking paths during winter.  Clean up any spills in your garage right away. This includes oil or grease spills. What can I do in the bathroom?  Use night lights.  Install grab bars by the toilet and in the tub and shower. Do not use towel bars as grab bars.  Use non-skid mats or decals in the tub or shower.  If you  need to sit down in the shower, use a plastic, non-slip stool.  Keep the floor dry. Clean up any water that spills on the floor as soon as it happens.  Remove soap buildup in the tub or shower regularly.  Attach bath mats securely with double-sided non-slip rug tape.  Do not have throw rugs and other things on the floor that can make you trip. What can I do in the bedroom?  Use night lights.  Make sure that you have a light by your bed that is easy to reach.  Do not use any sheets or blankets that are too big for your bed. They should not hang down onto the floor.  Have a firm chair that has side arms. You can use this for support while you get dressed.  Do not have throw rugs and other things on the floor that can make you trip. What can I do in the kitchen?  Clean up any spills right away.  Avoid walking on wet floors.  Keep items that you use a lot in easy-to-reach places.  If you need to reach something above you, use a strong step stool that has a grab bar.  Keep  electrical cords out of the way.  Do not use floor polish or wax that makes floors slippery. If you must use wax, use non-skid floor wax.  Do not have throw rugs and other things on the floor that can make you trip. What can I do with my stairs?  Do not leave any items on the stairs.  Make sure that there are handrails on both sides of the stairs and use them. Fix handrails that are broken or loose. Make sure that handrails are as long as the stairways.  Check any carpeting to make sure that it is firmly attached to the stairs. Fix any carpet that is loose or worn.  Avoid having throw rugs at the top or bottom of the stairs. If you do have throw rugs, attach them to the floor with carpet tape.  Make sure that you have a light switch at the top of the stairs and the bottom of the stairs. If you do not have them, ask someone to add them for you. What else can I do to help prevent falls?  Wear shoes  that:  Do not have high heels.  Have rubber bottoms.  Are comfortable and fit you well.  Are closed at the toe. Do not wear sandals.  If you use a stepladder:  Make sure that it is fully opened. Do not climb a closed stepladder.  Make sure that both sides of the stepladder are locked into place.  Ask someone to hold it for you, if possible.  Clearly mark and make sure that you can see:  Any grab bars or handrails.  First and last steps.  Where the edge of each step is.  Use tools that help you move around (mobility aids) if they are needed. These include:  Canes.  Walkers.  Scooters.  Crutches.  Turn on the lights when you go into a dark area. Replace any light bulbs as soon as they burn out.  Set up your furniture so you have a clear path. Avoid moving your furniture around.  If any of your floors are uneven, fix them.  If there are any pets around you, be aware of where they are.  Review your medicines with your doctor. Some medicines can make you feel dizzy. This can increase your chance of falling. Ask your doctor what other things that you can do to help prevent falls. This information is not intended to replace advice given to you by your health care provider. Make sure you discuss any questions you have with your health care provider. Document Released: 02/07/2009 Document Revised: 09/19/2015 Document Reviewed: 05/18/2014 Elsevier Interactive Patient Education  2017 [employer].

## 2020-07-16 ENCOUNTER — Encounter: Payer: Self-pay | Admitting: Family Medicine

## 2020-07-16 ENCOUNTER — Ambulatory Visit (INDEPENDENT_AMBULATORY_CARE_PROVIDER_SITE_OTHER): Payer: PPO | Admitting: Family Medicine

## 2020-07-16 ENCOUNTER — Other Ambulatory Visit: Payer: Self-pay

## 2020-07-16 ENCOUNTER — Ambulatory Visit
Admission: RE | Admit: 2020-07-16 | Discharge: 2020-07-16 | Disposition: A | Discharge status: Home / Self Care | Payer: PPO | Source: Ambulatory Visit | Attending: Family Medicine | Admitting: Family Medicine

## 2020-07-16 ENCOUNTER — Ambulatory Visit
Admission: RE | Admit: 2020-07-16 | Discharge: 2020-07-16 | Disposition: A | Discharge status: Home / Self Care | Payer: PPO | Attending: Family Medicine | Admitting: Family Medicine

## 2020-07-16 VITALS — BP 129/83 | HR 64 | Temp 97.8°F | Ht 63.0 in | Wt 118.0 lb

## 2020-07-16 DIAGNOSIS — M810 Age-related osteoporosis without current pathological fracture: Secondary | ICD-10-CM | POA: Diagnosis not present

## 2020-07-16 DIAGNOSIS — G8929 Other chronic pain: Secondary | ICD-10-CM

## 2020-07-16 DIAGNOSIS — E0590 Thyrotoxicosis, unspecified without thyrotoxic crisis or storm: Secondary | ICD-10-CM | POA: Diagnosis not present

## 2020-07-16 DIAGNOSIS — I471 Supraventricular tachycardia, unspecified: Secondary | ICD-10-CM

## 2020-07-16 DIAGNOSIS — D72829 Elevated white blood cell count, unspecified: Secondary | ICD-10-CM | POA: Diagnosis not present

## 2020-07-16 DIAGNOSIS — R079 Chest pain, unspecified: Secondary | ICD-10-CM | POA: Diagnosis not present

## 2020-07-16 DIAGNOSIS — I442 Atrioventricular block, complete: Secondary | ICD-10-CM

## 2020-07-16 DIAGNOSIS — M546 Pain in thoracic spine: Secondary | ICD-10-CM

## 2020-07-16 DIAGNOSIS — R7303 Prediabetes: Secondary | ICD-10-CM

## 2020-07-16 DIAGNOSIS — E782 Mixed hyperlipidemia: Secondary | ICD-10-CM

## 2020-07-16 DIAGNOSIS — Z0000 Encounter for general adult medical examination without abnormal findings: Secondary | ICD-10-CM | POA: Diagnosis not present

## 2020-07-16 DIAGNOSIS — M47814 Spondylosis without myelopathy or radiculopathy, thoracic region: Secondary | ICD-10-CM | POA: Diagnosis not present

## 2020-07-16 DIAGNOSIS — Z1231 Encounter for screening mammogram for malignant neoplasm of breast: Secondary | ICD-10-CM

## 2020-07-16 DIAGNOSIS — M4319 Spondylolisthesis, multiple sites in spine: Secondary | ICD-10-CM | POA: Diagnosis not present

## 2020-07-16 DIAGNOSIS — Z9049 Acquired absence of other specified parts of digestive tract: Secondary | ICD-10-CM | POA: Diagnosis not present

## 2020-07-16 NOTE — Assessment & Plan Note (Signed)
Exam is benign Given osteoporosis, however, will get x-ray to ensure no compression fracture Discussed rest, ice/heat, NSAIDs as needed

## 2020-07-16 NOTE — Assessment & Plan Note (Signed)
Recommend low carb diet °Recheck A1c  °

## 2020-07-16 NOTE — Assessment & Plan Note (Signed)
Noted on last labs Asymptomatic Recheck TSH and free T4

## 2020-07-16 NOTE — Assessment & Plan Note (Signed)
Followed by cardiology Pacemaker in place

## 2020-07-16 NOTE — Progress Notes (Signed)
Complete physical exam   Patient: Natalie Rosales   DOB: 24-May-1945   75 y.o. Female  MRN: [id number] Visit Date: 07/16/2020  Today's healthcare provider: Lavon Paganini, MD   Chief Complaint  Patient presents with  . Annual Exam   Subjective    Natalie Rosales is a 75 y.o. female who presents today for a complete physical exam.  She reports consuming a general diet. Exercises regularly She generally feels well. She reports sleeping well. She does have additional problems to discuss today.  Back Pain This is a chronic problem. The problem has been gradually worsening since onset. The pain is present in the lumbar spine. The pain does not radiate. Pertinent negatives include no numbness, paresthesias or weakness.   across T spine constantly Was told that she had OA of the spine. Tries rest Occasional celebrex and this helps No longer taking gabapentin - was on for sciatica - was dizzy on it No known injury or trauma  Taking fosamax regularly without side effects  Past Medical History:  Diagnosis Date  . Allergy   . Anxiety   . Arthritis    back  . Back pain    lower back  . Breast cancer ([place]) 2005    left breast ca, Lumpectomy, f/u with radiation   . Closed fracture of intracapsular section of femur ([place]) 11/17/2011  . Complete heart block ([place])    Pacemaker placed 2005  . Hyperlipidemia   . Personal history of radiation therapy   . PONV (postoperative nausea and vomiting)   . Presence of permanent cardiac pacemaker 06/05/2013   Adapta DR ADDR01  . Wears contact lenses   . Wears hearing aid    bilateral   Past Surgical History:  Procedure Laterality Date  . ABDOMINAL HYSTERECTOMY    . APPENDECTOMY  1982  . BREAST LUMPECTOMY Left 2005   f/u radiation   . CARDIAC PACEMAKER PLACEMENT  2005  . CATARACT EXTRACTION W/PHACO Right 11/15/2019   Procedure: CATARACT EXTRACTION PHACO AND INTRAOCULAR LENS PLACEMENT ([place]) RIGHT;  Surgeon: Leandrew Koyanagi, MD;   Location: [facility];  Service: Ophthalmology;  Laterality: Right;  8.61 1:12.4 11.9%  . CATARACT EXTRACTION W/PHACO Left 12/13/2019   Procedure: CATARACT EXTRACTION PHACO AND INTRAOCULAR LENS PLACEMENT ([place]) LEFT TORIC LENS;  Surgeon: Leandrew Koyanagi, MD;  Location: [facility];  Service: Ophthalmology;  Laterality: Left;  9.12 1:13.5 12.4%  . CHOLECYSTECTOMY  1982  . COLONOSCOPY WITH PROPOFOL N/A 06/10/2015   Procedure: COLONOSCOPY WITH PROPOFOL;  Surgeon: Lucilla Lame, MD;  Location: [facility];  Service: Endoscopy;  Laterality: N/A;  . HIP FRACTURE SURGERY Right 2012   Three pins    Social History   Socioeconomic History  . Marital status: Married    Spouse name: [name]  . Number of children: 3  . Years of education: College  . Highest education level: Associate degree: academic program  Occupational History  . 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  Tobacco Use  . Smoking status: Former Smoker    Types: Cigarettes    Quit date: 2005    Years since quitting: 17.2  . Smokeless tobacco: Never Used  . Tobacco comment: quit 2005 (was social smoker only)  Vaping Use  . Vaping Use: Never used  Substance and Sexual Activity  . Alcohol use: Not Currently  . Drug use: No  . Sexual activity: Not Currently  Other Topics Concern  . Not on  file  Social History Narrative  . Not on file   Social Determinants of Health   Financial Resource Strain: Low Risk   . Difficulty of Paying Living Expenses: Not hard at all  Food Insecurity: No Food Insecurity  . Worried About Charity fundraiser in the Last Year: Never true  . Ran Out of Food in the Last Year: Never true  Transportation Needs: No Transportation Needs  . Lack of Transportation (Medical): No  . Lack of Transportation (Non-Medical): No  Physical Activity: Sufficiently Active  . Days of Exercise per Week: 3 days  . Minutes of Exercise per Session: 60 min  Stress: No Stress  Concern Present  . Feeling of Stress : Not at all  Social Connections: Moderately Isolated  . Frequency of Communication with Friends and Family: More than three times a week  . Frequency of Social Gatherings with Friends and Family: More than three times a week  . Attends Religious Services: Never  . Active Member of Clubs or Organizations: No  . Attends Archivist Meetings: Never  . Marital Status: Married  Human resources officer Violence: Not At Risk  . Fear of Current or Ex-Partner: No  . Emotionally Abused: No  . Physically Abused: No  . Sexually Abused: No   Family Status  Relation Name Status  . Mother  Deceased  . Father  Deceased  . Sister  Deceased at age 63       Heart Disease  . Brother  Alive  . Cousin  (Not Specified)  . Niece  Alive   Family History  Problem Relation Age of Onset  . Cancer Mother   . Cancer Father        Lymphoma or Pancreatic Cancer  . Pancreatic cancer Father   . Lymphoma Father   . Heart disease Sister   . Breast cancer Sister   . Bladder Cancer Brother   . Lymphoma Brother   . Breast cancer Cousin   . Esophageal cancer Niece    Allergies  Allergen Reactions  . Peanut-Containing Drug Products Diarrhea    All nuts - diarrhea and fever blisters  . Eggs Or Egg-Derived Products Rash    Diarrhea, fever blisters    Patient Care Team: Virginia Crews, MD as PCP - General (Family Medicine) Corey Skains, MD as Consulting Physician (Cardiology) Leandrew Koyanagi, MD as Referring Physician (Ophthalmology) Jannet Mantis, MD (Dermatology)   Medications: Outpatient Medications Prior to Visit  Medication Sig  . alendronate (FOSAMAX) 70 MG tablet TAKE 1 TABLET BY MOUTH EVERY 7 (SEVEN) DAYS. TAKE WITH A FULL GLASS OF WATER ON AN EMPTY STOMACH.  Marland Kitchen ALPRAZolam (XANAX) 0.25 MG tablet Take 1 tablet (0.25 mg total) by mouth at bedtime as needed for anxiety.  . celecoxib (CELEBREX) 200 MG capsule Take 1 capsule (200 mg  total) by mouth daily.  . Collagen-Vitamin C-Biotin (COLLAGEN 1500/C PO) Take 1 tablet by mouth daily.  . mometasone (NASONEX) 50 MCG/ACT nasal spray Place 2 sprays into the nose daily.  Marland Kitchen omeprazole (PRILOSEC) 20 MG capsule Take 1 capsule (20 mg total) by mouth daily. As needed only  . [DISCONTINUED] gabapentin (NEURONTIN) 300 MG capsule Take 2 capsules (600 mg total) by mouth 2 (two) times daily. (Patient not taking: Reported on 07/08/2020)  . [DISCONTINUED] rosuvastatin (CRESTOR) 5 MG tablet TAKE 1 TABLET BY MOUTH EVERY OTHER DAY (Patient not taking: Reported on 07/08/2020)  . [DISCONTINUED] tiZANidine (ZANAFLEX) 4 MG tablet Take 2-4 mg by  mouth at bedtime as needed. (Patient not taking: No sig reported)  . [DISCONTINUED] valACYclovir (VALTREX) 1000 MG tablet Take 500 mg by mouth 2 (two) times daily. As needed (Patient not taking: Reported on 07/08/2020)   No facility-administered medications prior to visit.    Review of Systems  Musculoskeletal: Positive for back pain.  Neurological: Negative for weakness, numbness and paresthesias.      Objective    BP 129/83 (BP Location: Right Arm, Patient Position: Sitting, Cuff Size: Large)   Pulse 64   Temp 97.8 F (36.6 C) (Oral)   Ht 5\' 3"  (1.6 m)   Wt 118 lb (53.5 kg)   BMI 20.90 kg/m    Physical Exam Vitals reviewed.  Constitutional:      General: She is not in acute distress.    Appearance: Normal appearance. She is well-developed. She is not diaphoretic.  HENT:     Head: Normocephalic and atraumatic.     Right Ear: Tympanic membrane, ear canal and external ear normal.     Left Ear: Tympanic membrane, ear canal and external ear normal.     Nose: Nose normal.     Mouth/Throat:     Mouth: Mucous membranes are moist.     Pharynx: Oropharynx is clear. No oropharyngeal exudate.  Eyes:     General: No scleral icterus.    Conjunctiva/sclera: Conjunctivae normal.     Pupils: Pupils are equal, round, and reactive to light.  Neck:      Thyroid: No thyromegaly.  Cardiovascular:     Rate and Rhythm: Normal rate and regular rhythm.     Pulses: Normal pulses.     Heart sounds: Normal heart sounds. No murmur heard.   Pulmonary:     Effort: Pulmonary effort is normal. No respiratory distress.     Breath sounds: Normal breath sounds. No wheezing or rales.  Abdominal:     General: There is no distension.     Palpations: Abdomen is soft.     Tenderness: There is no abdominal tenderness.  Musculoskeletal:        General: No deformity.     Cervical back: Neck supple.     Right lower leg: No edema.     Left lower leg: No edema.     Comments: Back: No midline TTP, ROM grossly intact. Strength and sensation to light touch intact in lower extremities.   Lymphadenopathy:     Cervical: No cervical adenopathy.  Skin:    General: Skin is warm and dry.     Findings: No rash.  Neurological:     Mental Status: She is alert and oriented to person, place, and time. Mental status is at baseline.     Sensory: No sensory deficit.     Motor: No weakness.     Gait: Gait normal.  Psychiatric:        Mood and Affect: Mood normal.        Behavior: Behavior normal.        Thought Content: Thought content normal.       Last depression screening scores PHQ 2/9 Scores 07/08/2020 05/23/2019 05/23/2019  PHQ - 2 Score 0 0 0  PHQ- 9 Score - - -   Last fall risk screening Fall Risk  07/08/2020  Falls in the past year? 0  Number falls in past yr: 0  Injury with Fall? 0  Follow up Falls prevention discussed   Last Audit-C alcohol use screening Alcohol Use Disorder Test (AUDIT) 07/08/2020  1.  How often do you have a drink containing alcohol? 0  2. How many drinks containing alcohol do you have on a typical day when you are drinking? 0  3. How often do you have six or more drinks on one occasion? 0  AUDIT-C Score 0  Alcohol Brief Interventions/Follow-up AUDIT Score <7 follow-up not indicated   A score of 3 or more in women, and 4 or more  in men indicates increased risk for alcohol abuse, EXCEPT if all of the points are from question 1   No results found for any visits on 07/16/20.  Assessment & Plan    Routine Health Maintenance and Physical Exam  Exercise Activities and Dietary recommendations Goals    . DIET - INCREASE WATER INTAKE     Recommend to drink at least 6-8 8oz glasses of water per day.       Immunization History  Administered Date(s) Administered  . PFIZER(Purple Top)SARS-COV-2 Vaccination 05/26/2019, 06/16/2019, 02/19/2020  . Pneumococcal Conjugate-13 11/26/2014  . Pneumococcal Polysaccharide-23 01/13/2016  . Tdap 03/03/2017    Health Maintenance  Topic Date Due  . MAMMOGRAM  07/16/2020  . DEXA SCAN  07/16/2021  . COLONOSCOPY (Pts 45-34yrs Insurance coverage will need to be confirmed)  06/09/2025  . TETANUS/TDAP  03/04/2027  . COVID-19 Vaccine  Completed  . Hepatitis C Screening  Completed  . PNA vac Low Risk Adult  Completed  . HPV VACCINES  Aged Out    Discussed health benefits of physical activity, and encouraged her to engage in regular exercise appropriate for her age and condition.  Problem List Items Addressed This Visit      Cardiovascular and Mediastinum   Acquired complete AV block ([place])    Followed by cardiology Pacemaker in place        Endocrine   Subclinical hyperthyroidism    Noted on last labs Asymptomatic Recheck TSH and free T4      Relevant Orders   TSH + free T4     Musculoskeletal and Integument   Osteoporosis    Doing well on Fosamax We will continue Next DEXA in 1 year      Relevant Orders   VITAMIN D 25 Hydroxy (Vit-D Deficiency, Fractures)     Other   Combined fat and carbohydrate induced hyperlipemia    Not currently on a statin as her sister had significant complications from 1 Recheck metabolic panel and lipid panel      Relevant Orders   Comprehensive metabolic panel   Lipid panel   Chronic bilateral thoracic back pain    Exam is  benign Given osteoporosis, however, will get x-ray to ensure no compression fracture Discussed rest, ice/heat, NSAIDs as needed      Relevant Orders   DG Thoracic Spine W/Swimmers   Prediabetes    Recommend low carb diet Recheck A1c      Relevant Orders   Hemoglobin A1c    Other Visit Diagnoses    Encounter for annual physical exam    -  Primary   Screening mammogram for breast cancer       Relevant Orders   MM 3D SCREEN BREAST BILATERAL   Leukocytosis, unspecified type       Relevant Orders   CBC w/Diff/Platelet       Return in about 1 year (around 07/16/2021) for CPE, AWV.     I, Lavon Paganini, MD, have reviewed all documentation for this visit. The documentation on 07/16/20 for the exam, diagnosis, procedures, and orders  are all accurate and complete.   Shelley Cocke, Dionne Bucy, MD, MPH [facility]

## 2020-07-16 NOTE — Assessment & Plan Note (Signed)
Not currently on a statin as her sister had significant complications from 1 Recheck metabolic panel and lipid panel

## 2020-07-16 NOTE — Assessment & Plan Note (Signed)
Doing well on Fosamax We will continue Next DEXA in 1 year

## 2020-07-17 LAB — CBC WITH DIFFERENTIAL/PLATELET
Basophils Absolute: 0 10*3/uL (ref 0.0–0.2)
Basos: 0 %
EOS (ABSOLUTE): 0.2 10*3/uL (ref 0.0–0.4)
Eos: 3 %
Hematocrit: 38.6 % (ref 34.0–46.6)
Hemoglobin: 12.9 g/dL (ref 11.1–15.9)
Immature Grans (Abs): 0 10*3/uL (ref 0.0–0.1)
Immature Granulocytes: 0 %
Lymphocytes Absolute: 2.4 10*3/uL (ref 0.7–3.1)
Lymphs: 35 %
MCH: 30 pg (ref 26.6–33.0)
MCHC: 33.4 g/dL (ref 31.5–35.7)
MCV: 90 fL (ref 79–97)
Monocytes Absolute: 0.6 10*3/uL (ref 0.1–0.9)
Monocytes: 8 %
Neutrophils Absolute: 3.7 10*3/uL (ref 1.4–7.0)
Neutrophils: 54 %
Platelets: 287 10*3/uL (ref 150–450)
RBC: 4.3 x10E6/uL (ref 3.77–5.28)
RDW: 13.1 % (ref 11.7–15.4)
WBC: 7 10*3/uL (ref 3.4–10.8)

## 2020-07-17 LAB — COMPREHENSIVE METABOLIC PANEL
ALT: 5 IU/L (ref 0–32)
AST: 24 IU/L (ref 0–40)
Albumin/Globulin Ratio: 2.2 (ref 1.2–2.2)
Albumin: 4.7 g/dL (ref 3.7–4.7)
Alkaline Phosphatase: 58 IU/L (ref 44–121)
BUN/Creatinine Ratio: 15 (ref 12–28)
BUN: 13 mg/dL (ref 8–27)
Bilirubin Total: 0.6 mg/dL (ref 0.0–1.2)
CO2: 22 mmol/L (ref 20–29)
Calcium: 9.4 mg/dL (ref 8.7–10.3)
Chloride: 103 mmol/L (ref 96–106)
Creatinine, Ser: 0.88 mg/dL (ref 0.57–1.00)
Globulin, Total: 2.1 g/dL (ref 1.5–4.5)
Glucose: 89 mg/dL (ref 65–99)
Potassium: 4.4 mmol/L (ref 3.5–5.2)
Sodium: 142 mmol/L (ref 134–144)
Total Protein: 6.8 g/dL (ref 6.0–8.5)
eGFR: 68 mL/min/{1.73_m2} (ref >59)

## 2020-07-17 LAB — LIPID PANEL
Chol/HDL Ratio: 2.6 ratio (ref 0.0–4.4)
Cholesterol, Total: 168 mg/dL (ref 100–199)
HDL: 64 mg/dL (ref >39)
LDL Chol Calc (NIH): 92 mg/dL (ref 0–99)
Triglycerides: 64 mg/dL (ref 0–149)
VLDL Cholesterol Cal: 12 mg/dL (ref 5–40)

## 2020-07-17 LAB — TSH+FREE T4
Free T4: 1.12 ng/dL (ref 0.82–1.77)
TSH: 1.3 u[IU]/mL (ref 0.450–4.500)

## 2020-07-17 LAB — VITAMIN D 25 HYDROXY (VIT D DEFICIENCY, FRACTURES): Vit D, 25-Hydroxy: 20.2 ng/mL — ABNORMAL LOW (ref 30.0–100.0)

## 2020-07-17 LAB — HEMOGLOBIN A1C
Est. average glucose Bld gHb Est-mCnc: 114 mg/dL
Hgb A1c MFr Bld: 5.6 % (ref 4.8–5.6)

## 2020-07-26 DIAGNOSIS — Z961 Presence of intraocular lens: Secondary | ICD-10-CM | POA: Diagnosis not present

## 2020-08-06 ENCOUNTER — Ambulatory Visit: Payer: PPO

## 2020-08-14 ENCOUNTER — Other Ambulatory Visit: Payer: Self-pay

## 2020-08-14 ENCOUNTER — Ambulatory Visit
Admission: RE | Admit: 2020-08-14 | Discharge: 2020-08-14 | Disposition: A | Discharge status: Home / Self Care | Payer: PPO | Source: Ambulatory Visit | Attending: Family Medicine | Admitting: Family Medicine

## 2020-08-14 DIAGNOSIS — Z1231 Encounter for screening mammogram for malignant neoplasm of breast: Secondary | ICD-10-CM | POA: Insufficient documentation

## 2020-08-14 HISTORY — DX: Malignant neoplasm of cervix uteri, unspecified: C53.9

## 2020-09-09 ENCOUNTER — Other Ambulatory Visit: Payer: Self-pay | Admitting: Family Medicine

## 2020-09-09 NOTE — Telephone Encounter (Signed)
Requested Prescriptions  Pending Prescriptions Disp Refills  . alendronate (FOSAMAX) 70 MG tablet [Pharmacy Med Name: ALENDRONATE SODIUM 70 MG TAB] 12 tablet 0    Sig: TAKE 1 TABLET BY MOUTH EVERY 7 (SEVEN) DAYS. TAKE WITH A FULL GLASS OF WATER ON AN EMPTY STOMACH.     Endocrinology:  Bisphosphonates Failed - 09/09/2020  1:28 AM      Failed - Vitamin D in normal range and within 360 days    Vit D, 25-Hydroxy  Date Value Ref Range Status  07/16/2020 20.2 (L) 30.0 - 100.0 ng/mL Final    Comment:    Vitamin D deficiency has been defined by the [place] practice guideline as a level of serum 25-OH vitamin D less than 20 ng/mL (1,2). The Endocrine Society went on to further define vitamin D insufficiency as a level between 21 and 29 ng/mL (2). 1. IOM (Institute of Medicine). 2010. Dietary reference    intakes for calcium and D. [place]: The    [employer]. 2. Holick MF, Binkley [place], Bischoff-Ferrari HA, et al.    Evaluation, treatment, and prevention of vitamin D    deficiency: an Endocrine Society clinical practice    guideline. JCEM. 2011 Jul; 96(7):1911-30.          Passed - Ca in normal range and within 360 days    Calcium  Date Value Ref Range Status  07/16/2020 9.4 8.7 - 10.3 mg/dL Final   Calcium, Total  Date Value Ref Range Status  05/29/2014 9.1 8.5 - 10.1 mg/dL Final         Passed - Valid encounter within last 12 months    Recent Outpatient Visits          1 month ago Encounter for annual physical exam   [facility] [place], Dionne Bucy, MD   1 year ago Encounter for annual physical exam   [facility] [place], Dionne Bucy, MD   1 year ago [place], Dionne Bucy, MD   2 years ago Encounter for annual physical exam   [facility] [place], Dionne Bucy, MD   2 years ago Strain of left psoas muscle, initial encounter   [place], Dionne Bucy, MD      Future Appointments            In 10 months Bacigalupo, Dionne Bucy, MD [facility] [place]

## 2020-09-10 DIAGNOSIS — I442 Atrioventricular block, complete: Secondary | ICD-10-CM | POA: Diagnosis not present

## 2020-11-30 ENCOUNTER — Other Ambulatory Visit: Payer: Self-pay | Admitting: Family Medicine

## 2021-01-27 DIAGNOSIS — D485 Neoplasm of uncertain behavior of skin: Secondary | ICD-10-CM | POA: Diagnosis not present

## 2021-01-27 DIAGNOSIS — L570 Actinic keratosis: Secondary | ICD-10-CM | POA: Diagnosis not present

## 2021-01-27 DIAGNOSIS — Z872 Personal history of diseases of the skin and subcutaneous tissue: Secondary | ICD-10-CM | POA: Diagnosis not present

## 2021-01-27 DIAGNOSIS — L821 Other seborrheic keratosis: Secondary | ICD-10-CM | POA: Diagnosis not present

## 2021-01-27 DIAGNOSIS — L720 Epidermal cyst: Secondary | ICD-10-CM | POA: Diagnosis not present

## 2021-01-27 DIAGNOSIS — C44519 Basal cell carcinoma of skin of other part of trunk: Secondary | ICD-10-CM | POA: Diagnosis not present

## 2021-01-27 DIAGNOSIS — L578 Other skin changes due to chronic exposure to nonionizing radiation: Secondary | ICD-10-CM | POA: Diagnosis not present

## 2021-01-29 ENCOUNTER — Other Ambulatory Visit: Payer: Self-pay | Admitting: Family Medicine

## 2021-01-29 DIAGNOSIS — M545 Low back pain, unspecified: Secondary | ICD-10-CM

## 2021-01-29 NOTE — Telephone Encounter (Signed)
Requested medication (s) are due for refill today - no  Requested medication (s) are on the active medication list -yes  Future visit scheduled -yes  Last refill: 07/03/19 #90 1RF  Notes to clinic: Request RF: expired Rx  Requested Prescriptions  Pending Prescriptions Disp Refills   celecoxib (CELEBREX) 200 MG capsule [Pharmacy Med Name: CELECOXIB 200 MG CAPSULE] 90 capsule 1    Sig: TAKE 1 CAPSULE BY MOUTH EVERY DAY     Analgesics:  COX2 Inhibitors Passed - 01/29/2021  8:51 AM      Passed - HGB in normal range and within 360 days    Hemoglobin  Date Value Ref Range Status  07/16/2020 12.9 11.1 - 15.9 g/dL Final          Passed - Cr in normal range and within 360 days    Creatinine  Date Value Ref Range Status  05/29/2014 0.96 0.60 - 1.30 mg/dL Final   Creatinine, Ser  Date Value Ref Range Status  07/16/2020 0.88 0.57 - 1.00 mg/dL Final          Passed - Patient is not pregnant      Passed - Valid encounter within last 12 months    Recent Outpatient Visits           6 months ago Encounter for annual physical exam   [facility], Dionne Bucy, MD   1 year ago Encounter for annual physical exam   [facility] [place], Dionne Bucy, MD   2 years ago [place], Dionne Bucy, MD   2 years ago Encounter for annual physical exam   [facility] [place], Dionne Bucy, MD   2 years ago Strain of left psoas muscle, initial encounter   [facility] [place], Dionne Bucy, MD       Future Appointments             In 5 months Bacigalupo, Dionne Bucy, MD [facility], [facility]               Requested Prescriptions  Pending Prescriptions Disp Refills   celecoxib (CELEBREX) 200 MG capsule [Pharmacy Med Name: CELECOXIB 200 MG CAPSULE] 90 capsule 1    Sig: TAKE 1 CAPSULE BY MOUTH EVERY DAY     Analgesics:  COX2 Inhibitors Passed - 01/29/2021  8:51 AM      Passed - HGB  in normal range and within 360 days    Hemoglobin  Date Value Ref Range Status  07/16/2020 12.9 11.1 - 15.9 g/dL Final          Passed - Cr in normal range and within 360 days    Creatinine  Date Value Ref Range Status  05/29/2014 0.96 0.60 - 1.30 mg/dL Final   Creatinine, Ser  Date Value Ref Range Status  07/16/2020 0.88 0.57 - 1.00 mg/dL Final          Passed - Patient is not pregnant      Passed - Valid encounter within last 12 months    Recent Outpatient Visits           6 months ago Encounter for annual physical exam   [facility] [place], Dionne Bucy, MD   1 year ago Encounter for annual physical exam   [facility] [place], Dionne Bucy, MD   2 years ago [place], Dionne Bucy, MD   2 years ago Encounter for annual physical  exam   [facility] [place], Dionne Bucy, MD   2 years ago Strain of left psoas muscle, initial encounter   [facility] [place], Dionne Bucy, MD       Future Appointments             In 5 months Bacigalupo, Dionne Bucy, MD [facility], [place]

## 2021-02-01 ENCOUNTER — Telehealth: Payer: PPO | Admitting: Nurse Practitioner

## 2021-02-01 DIAGNOSIS — N390 Urinary tract infection, site not specified: Secondary | ICD-10-CM

## 2021-02-01 MED ORDER — SULFAMETHOXAZOLE-TRIMETHOPRIM 800-160 MG PO TABS: 1.0000 | ORAL_TABLET | Freq: Two times a day (BID) | ORAL | 0 refills | Status: AC

## 2021-02-01 NOTE — Progress Notes (Signed)

## 2021-02-01 NOTE — Progress Notes (Signed)
I have spent 5 minutes in review of e-visit questionnaire, review and updating patient chart, medical decision making and response to patient.  ° °Loic Hobin W Emeli Goguen, NP ° °  °

## 2021-02-11 DIAGNOSIS — I442 Atrioventricular block, complete: Secondary | ICD-10-CM | POA: Diagnosis not present

## 2021-02-12 DIAGNOSIS — D2111 Benign neoplasm of connective and other soft tissue of right upper limb, including shoulder: Secondary | ICD-10-CM | POA: Diagnosis not present

## 2021-02-12 DIAGNOSIS — D487 Neoplasm of uncertain behavior of other specified sites: Secondary | ICD-10-CM | POA: Diagnosis not present

## 2021-02-26 DIAGNOSIS — C44519 Basal cell carcinoma of skin of other part of trunk: Secondary | ICD-10-CM | POA: Diagnosis not present

## 2021-03-31 DIAGNOSIS — L570 Actinic keratosis: Secondary | ICD-10-CM | POA: Diagnosis not present

## 2021-05-12 DIAGNOSIS — L570 Actinic keratosis: Secondary | ICD-10-CM | POA: Diagnosis not present

## 2021-05-26 ENCOUNTER — Other Ambulatory Visit: Payer: Self-pay | Admitting: Family Medicine

## 2021-05-26 NOTE — Telephone Encounter (Signed)
Requested Prescriptions  Pending Prescriptions Disp Refills   alendronate (FOSAMAX) 70 MG tablet [Pharmacy Med Name: ALENDRONATE SODIUM 70 MG TAB] 12 tablet 1    Sig: TAKE 1 TABLET BY MOUTH EVERY 7 (SEVEN) DAYS. TAKE WITH A FULL GLASS OF WATER ON AN EMPTY STOMACH.     Endocrinology:  Bisphosphonates Failed - 05/26/2021  1:48 AM      Failed - Vitamin D in normal range and within 360 days    Vit D, 25-Hydroxy  Date Value Ref Range Status  07/16/2020 20.2 (L) 30.0 - 100.0 ng/mL Final    Comment:    Vitamin D deficiency has been defined by the [place] practice guideline as a level of serum 25-OH vitamin D less than 20 ng/mL (1,2). The Endocrine Society went on to further define vitamin D insufficiency as a level between 21 and 29 ng/mL (2). 1. IOM (Institute of Medicine). 2010. Dietary reference    intakes for calcium and D. [place]: The    [employer]. 2. Holick MF, Binkley [place], Bischoff-Ferrari HA, et al.    Evaluation, treatment, and prevention of vitamin D    deficiency: an Endocrine Society clinical practice    guideline. JCEM. 2011 Jul; 96(7):1911-30.          Passed - Ca in normal range and within 360 days    Calcium  Date Value Ref Range Status  07/16/2020 9.4 8.7 - 10.3 mg/dL Final   Calcium, Total  Date Value Ref Range Status  05/29/2014 9.1 8.5 - 10.1 mg/dL Final         Passed - Valid encounter within last 12 months    Recent Outpatient Visits          10 months ago Encounter for annual physical exam   [facility] [place], Dionne Bucy, MD   1 year ago Encounter for annual physical exam   [facility] [place], Dionne Bucy, MD   2 years ago [place], Dionne Bucy, MD   3 years ago Encounter for annual physical exam   [facility] [place], Dionne Bucy, MD   3 years ago Strain of left psoas muscle, initial encounter   [place], Dionne Bucy, MD      Future Appointments            In 1 month Bacigalupo, Dionne Bucy, MD [facility], [place]

## 2021-07-08 DIAGNOSIS — I442 Atrioventricular block, complete: Secondary | ICD-10-CM | POA: Diagnosis not present

## 2021-07-16 DIAGNOSIS — I471 Supraventricular tachycardia: Secondary | ICD-10-CM | POA: Diagnosis not present

## 2021-07-16 DIAGNOSIS — Z950 Presence of cardiac pacemaker: Secondary | ICD-10-CM | POA: Diagnosis not present

## 2021-07-16 DIAGNOSIS — E782 Mixed hyperlipidemia: Secondary | ICD-10-CM | POA: Diagnosis not present

## 2021-07-16 DIAGNOSIS — I493 Ventricular premature depolarization: Secondary | ICD-10-CM | POA: Diagnosis not present

## 2021-07-16 DIAGNOSIS — I442 Atrioventricular block, complete: Secondary | ICD-10-CM | POA: Diagnosis not present

## 2021-07-18 IMAGING — MG DIGITAL SCREENING BILAT W/ TOMO W/ CAD
6 of 10 series · 6 of 30 positions shown · non-contrast
Comparison: Previous exam(s).

CLINICAL DATA: Screening.

EXAM:
DIGITAL SCREENING BILATERAL MAMMOGRAM WITH TOMO AND CAD

[L CC synth-2D]
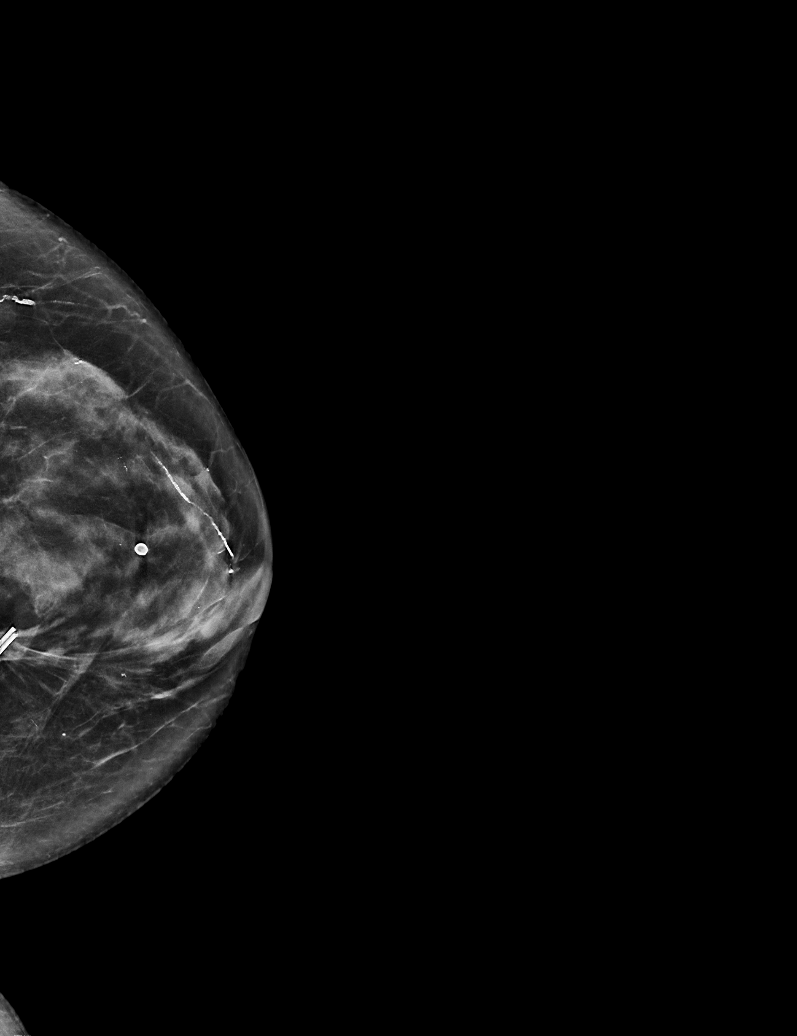

[L MLO synth-2D (1 of 2)]
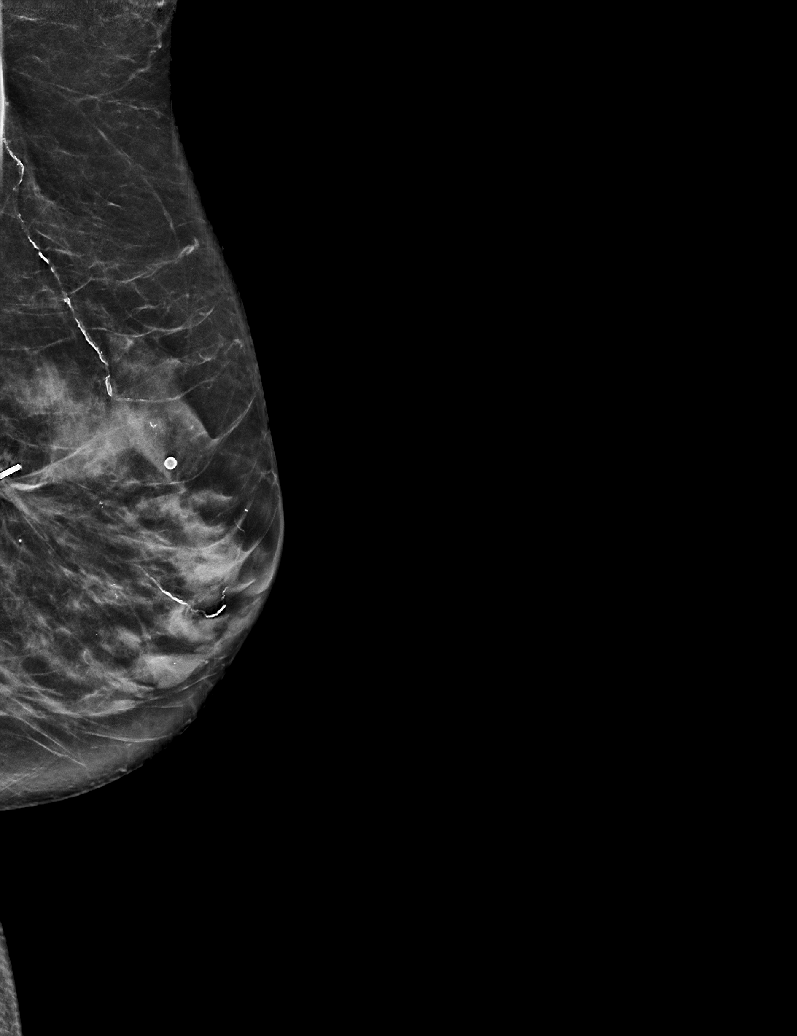

[L MLO synth-2D (2 of 2)]
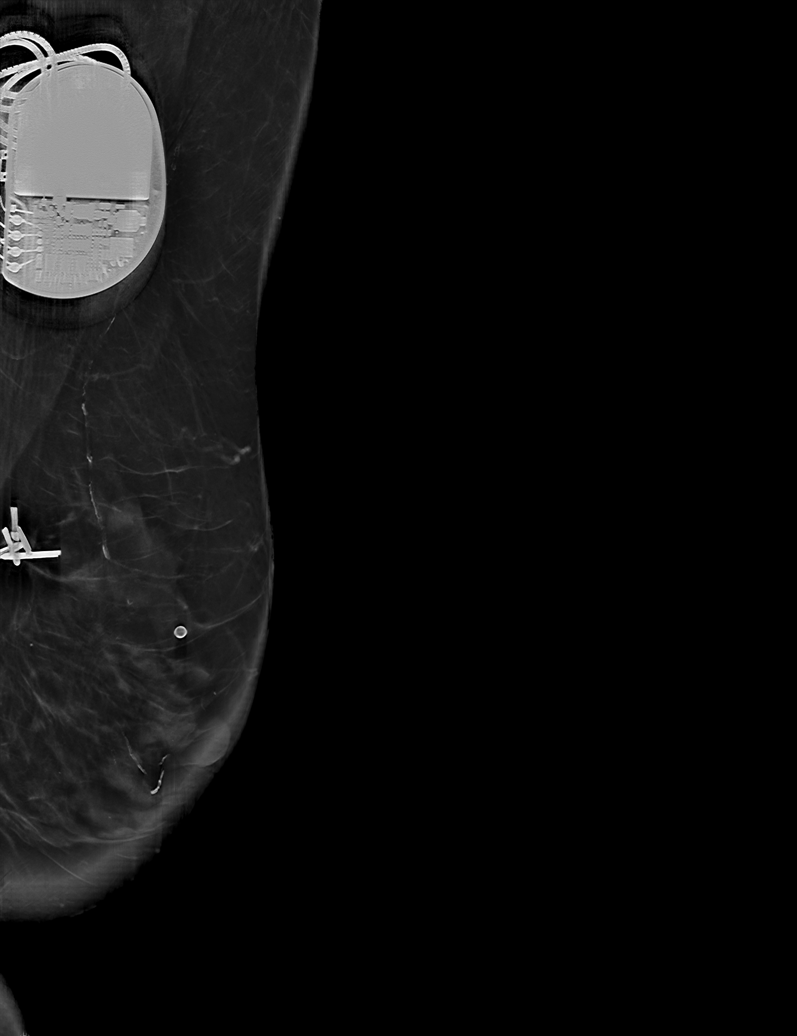

[R CC synth-2D]
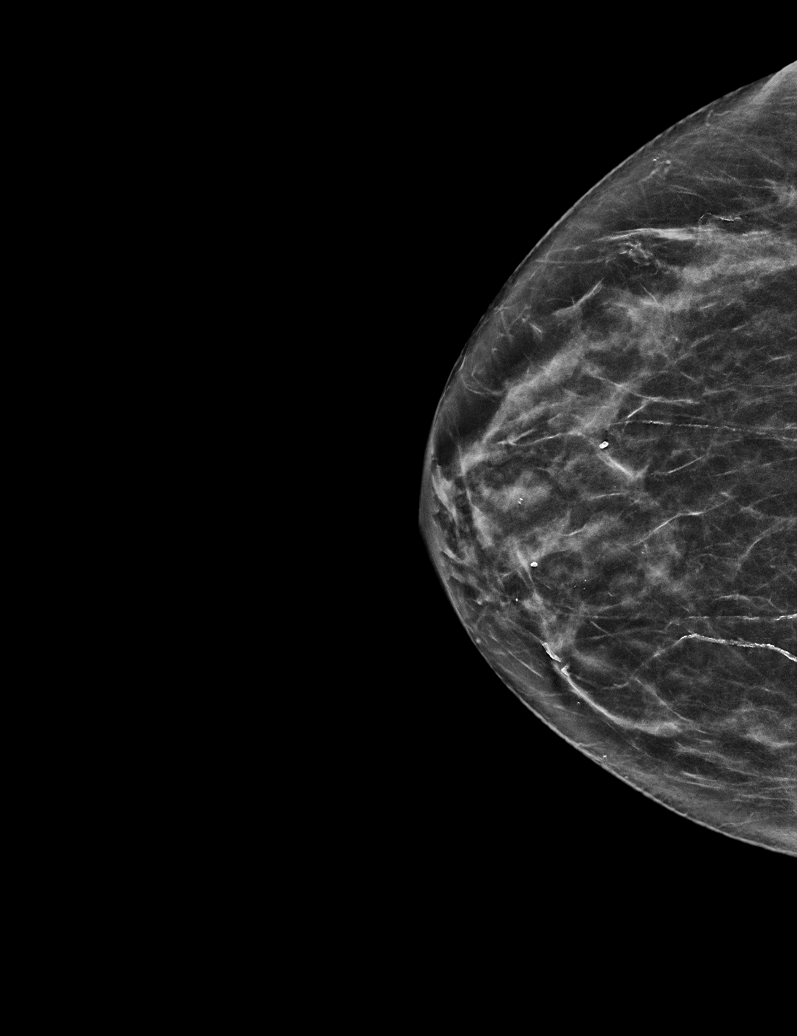

[R MLO synth-2D]
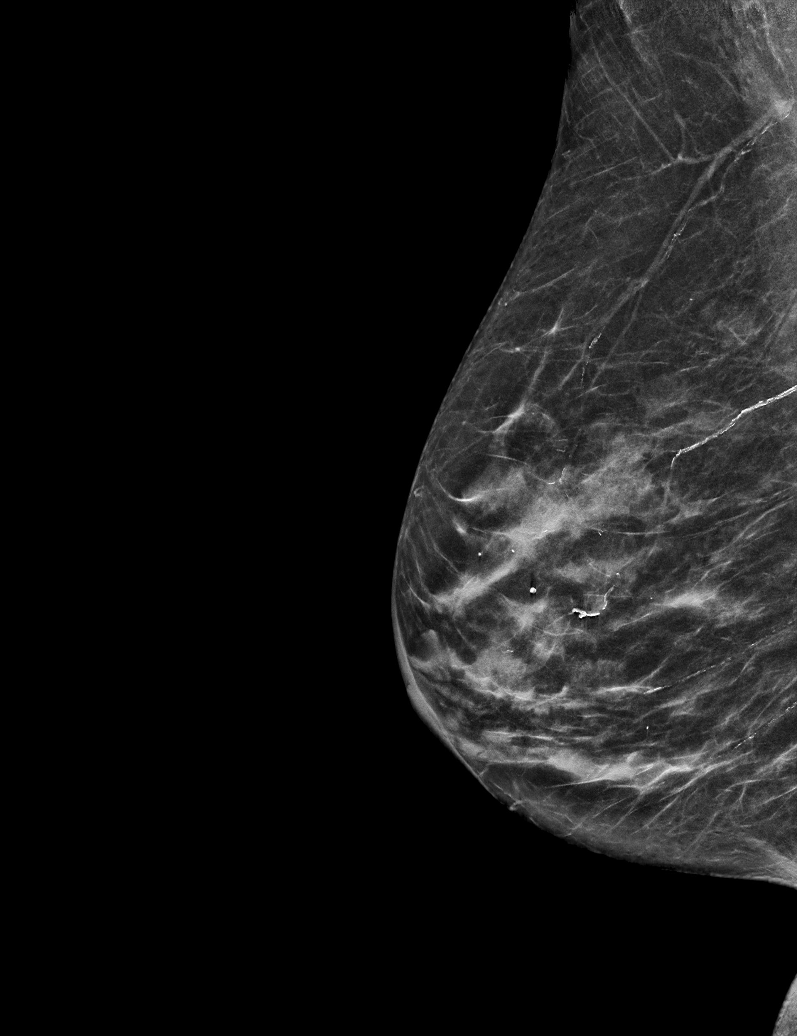

[L CC tomo · tomo slice 29/57.0]
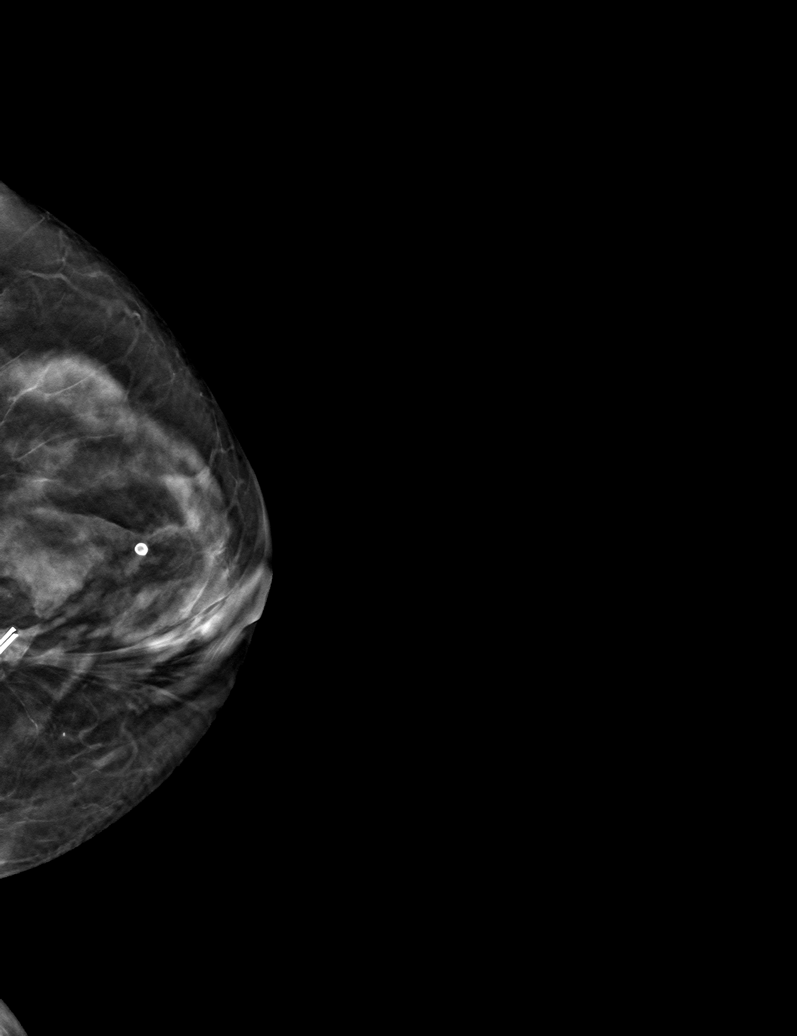

[6 of 30 positions shown; findings below may reference images not displayed]

ACR Breast Density Category c: The breast tissue is heterogeneously
dense, which may obscure small masses.
FINDINGS: There are no findings suspicious for malignancy. Images were
processed with CAD.
IMPRESSION: No mammographic evidence of malignancy. A result letter of this
screening mammogram will be mailed directly to the patient.

RECOMMENDATION:
Screening mammogram in one year. (Code:[id number])

BI-RADS CATEGORY  1: Negative.

## 2021-07-18 NOTE — Progress Notes (Signed)
? ? ? ?Complete physical exam ? ? ?Patient: Natalie Rosales   DOB: 1945-07-28   76 y.o. Female  MRN: [id number] ?Visit Date: 07/21/2021 ? ?Today's healthcare provider: Lavon Paganini, MD  ? ?Chief Complaint  ?Patient presents with  ? Annual Exam  ? ?Subjective  ?  ?Natalie Rosales is a 76 y.o. female who presents today for a complete physical exam.  ?She reports consuming a general diet. Home exercise routine includes treadmill. She generally feels well. She reports sleeping fairly well. She does not have additional problems to discuss today.  ?HPI  ? ? ?Past Medical History:  ?Diagnosis Date  ? Allergy   ? Anxiety   ? Arthritis   ? back  ? Back pain   ? lower back  ? Breast cancer ([place]) 2005  ?  left breast ca, Lumpectomy, f/u with radiation   ? Cervical cancer ([place])   ? Closed fracture of intracapsular section of femur ([place]) 11/17/2011  ? Complete heart block ([place])   ? Pacemaker placed 2005  ? Hyperlipidemia   ? Personal history of radiation therapy   ? PONV (postoperative nausea and vomiting)   ? Presence of permanent cardiac pacemaker 06/05/2013  ? Adapta DR [id number]  ? Wears contact lenses   ? Wears hearing aid   ? bilateral  ? ?Past Surgical History:  ?Procedure Laterality Date  ? ABDOMINAL HYSTERECTOMY    ? APPENDECTOMY  1982  ? BREAST LUMPECTOMY Left 2005  ? f/u radiation   ? CARDIAC PACEMAKER PLACEMENT  2005  ? CATARACT EXTRACTION W/PHACO Right 11/15/2019  ? Procedure: CATARACT EXTRACTION PHACO AND INTRAOCULAR LENS PLACEMENT ([place]) RIGHT;  Surgeon: Leandrew Koyanagi, MD;  Location: [facility];  Service: Ophthalmology;  Laterality: Right;  8.61 ?1:12.4 ?11.9%  ? CATARACT EXTRACTION W/PHACO Left 12/13/2019  ? Procedure: CATARACT EXTRACTION PHACO AND INTRAOCULAR LENS PLACEMENT ([place]) LEFT TORIC LENS;  Surgeon: Leandrew Koyanagi, MD;  Location: [facility];  Service: Ophthalmology;  Laterality: Left;  9.12 ?1:13.5 ?12.4%  ? CHOLECYSTECTOMY  1982  ? COLONOSCOPY WITH PROPOFOL N/A 06/10/2015  ?  Procedure: COLONOSCOPY WITH PROPOFOL;  Surgeon: Lucilla Lame, MD;  Location: [facility];  Service: Endoscopy;  Laterality: N/A;  ? HIP FRACTURE SURGERY Right 2012  ? Three pins   ? ?Social History  ? ?Socioeconomic History  ? Marital status: Married  ?  Spouse name: [name]  ? Number of children: 3  ? Years of education: College  ? Highest education level: Associate degree: academic program  ?Occupational History  ? Occupation: Hair Dresser  ?  Comment: 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  ?Tobacco Use  ? Smoking status: Former  ?  Types: Cigarettes  ?  Quit date: 2005  ?  Years since quitting: 18.2  ? Smokeless tobacco: Never  ? Tobacco comments:  ?  quit 2005 (was social smoker only)  ?Vaping Use  ? Vaping Use: Never used  ?Substance and Sexual Activity  ? Alcohol use: Not Currently  ? Drug use: No  ? Sexual activity: Not Currently  ?Other Topics Concern  ? Not on file  ?Social History Narrative  ? Not on file  ? ?Social Determinants of Health  ? ?Financial Resource Strain: Low Risk   ? Difficulty of Paying Living Expenses: Not hard at all  ?Food Insecurity: No Food Insecurity  ? Worried About Charity fundraiser in the Last Year: Never true  ? Ran Out of Food in the Last Year: Never true  ?Transportation  Needs: No Transportation Needs  ? Lack of Transportation (Medical): No  ? Lack of Transportation (Non-Medical): No  ?Physical Activity: Sufficiently Active  ? Days of Exercise per Week: 4 days  ? Minutes of Exercise per Session: 60 min  ?Stress: No Stress Concern Present  ? Feeling of Stress : Only a little  ?Social Connections: Moderately Integrated  ? Frequency of Communication with Friends and Family: More than three times a week  ? Frequency of Social Gatherings with Friends and Family: More than three times a week  ? Attends Religious Services: Never  ? Active Member of Clubs or Organizations: Yes  ? Attends Archivist Meetings: Never  ? Marital Status: Married  ?Intimate Partner Violence: Not At Risk  ? Fear  of Current or Ex-Partner: No  ? Emotionally Abused: No  ? Physically Abused: No  ? Sexually Abused: No  ? ?Family Status  ?Relation Name Status  ? Mother  Deceased  ? Father  Deceased  ? Sister  Deceased at age 37  ?     Heart Disease  ? Brother  Alive  ? Cousin  (Not Specified)  ? Niece  Alive  ? ?Family History  ?Problem Relation Age of Onset  ? Cancer Mother   ? Cancer Father   ?     Lymphoma or Pancreatic Cancer  ? Pancreatic cancer Father   ? Lymphoma Father   ? Heart disease Sister   ? Breast cancer Sister   ? Bladder Cancer Brother   ? Lymphoma Brother   ? Breast cancer Cousin   ?     2 mat cousins  ? Esophageal cancer Niece   ? ?Allergies  ?Allergen Reactions  ? Peanut-Containing Drug Products Diarrhea  ?  All nuts - diarrhea and fever blisters  ? Eggs Or Egg-Derived Products Rash  ?  Diarrhea, fever blisters  ?  ?Patient Care Team: ?Virginia Crews, MD as PCP - General (Family Medicine) ?Corey Skains, MD as Consulting Physician (Cardiology) ?Leandrew Koyanagi, MD as Referring Physician (Ophthalmology) ?Ree Edman, MD (Dermatology)  ? ?Medications: ?Outpatient Medications Prior to Visit  ?Medication Sig  ? alendronate (FOSAMAX) 70 MG tablet TAKE 1 TABLET BY MOUTH EVERY 7 (SEVEN) DAYS. TAKE WITH A FULL GLASS OF WATER ON AN EMPTY STOMACH.  ? celecoxib (CELEBREX) 200 MG capsule TAKE 1 CAPSULE BY MOUTH EVERY DAY  ? Collagen-Vitamin C-Biotin (COLLAGEN 1500/C PO) Take 1 tablet by mouth daily.  ? mometasone (NASONEX) 50 MCG/ACT nasal spray Place 2 sprays into the nose daily.  ? [DISCONTINUED] ALPRAZolam (XANAX) 0.25 MG tablet Take 1 tablet (0.25 mg total) by mouth at bedtime as needed for anxiety.  ? [DISCONTINUED] omeprazole (PRILOSEC) 20 MG capsule Take 1 capsule (20 mg total) by mouth daily. As needed only  ? ?No facility-administered medications prior to visit.  ? ? ?Review of Systems  ?HENT:  Positive for hearing loss and tinnitus.   ?Endocrine: Positive for cold intolerance.   ?Musculoskeletal:  Positive for neck stiffness.  ?All other systems reviewed and are negative. ? ?Last CBC ?Lab Results  ?Component Value Date  ? WBC 7.0 07/16/2020  ? HGB 12.9 07/16/2020  ? HCT 38.6 07/16/2020  ? MCV 90 07/16/2020  ? MCH 30.0 07/16/2020  ? RDW 13.1 07/16/2020  ? PLT 287 07/16/2020  ? ?Last metabolic panel ?Lab Results  ?Component Value Date  ? GLUCOSE 89 07/16/2020  ? NA 142 07/16/2020  ? K 4.4 07/16/2020  ? CL 103 07/16/2020  ?  CO2 22 07/16/2020  ? BUN 13 07/16/2020  ? CREATININE 0.88 07/16/2020  ? EGFR 68 07/16/2020  ? CALCIUM 9.4 07/16/2020  ? PROT 6.8 07/16/2020  ? ALBUMIN 4.7 07/16/2020  ? LABGLOB 2.1 07/16/2020  ? AGRATIO 2.2 07/16/2020  ? BILITOT 0.6 07/16/2020  ? ALKPHOS 58 07/16/2020  ? AST 24 07/16/2020  ? ALT 5 07/16/2020  ? ANIONGAP 6 (L) 05/29/2014  ? ?Last lipids ?Lab Results  ?Component Value Date  ? CHOL 168 07/16/2020  ? HDL 64 07/16/2020  ? [place] 92 07/16/2020  ? TRIG 64 07/16/2020  ? CHOLHDL 2.6 07/16/2020  ? ?Last hemoglobin A1c ?Lab Results  ?Component Value Date  ? HGBA1C 5.6 07/16/2020  ? ?Last thyroid functions ?Lab Results  ?Component Value Date  ? TSH 1.300 07/16/2020  ? ?  ? Objective  ?  ?BP 125/61   Pulse 70   Temp (!) 97.2 ?F (36.2 ?C) (Temporal)   Resp 15   Ht 5' 1" (1.549 m)   Wt 117 lb 8 oz (53.3 kg)   SpO2 100%   BMI 22.20 kg/m?  ?BP Readings from Last 3 Encounters:  ?07/21/21 125/61  ?07/21/21 100/60  ?07/16/20 129/83  ? ?Wt Readings from Last 3 Encounters:  ?07/21/21 117 lb 8 oz (53.3 kg)  ?07/21/21 117 lb 14.4 oz (53.5 kg)  ?07/16/20 118 lb (53.5 kg)  ? ?  ? ? ?Physical Exam ?Vitals reviewed.  ?Constitutional:   ?   General: She is not in acute distress. ?   Appearance: Normal appearance. She is well-developed. She is not diaphoretic.  ?HENT:  ?   Head: Normocephalic and atraumatic.  ?   Right Ear: Tympanic membrane, ear canal and external ear normal.  ?   Left Ear: Tympanic membrane, ear canal and external ear normal.  ?   Nose: Nose normal.  ?    Mouth/Throat:  ?   Mouth: Mucous membranes are moist.  ?   Pharynx: Oropharynx is clear. No oropharyngeal exudate.  ?Eyes:  ?   General: No scleral icterus. ?   Conjunctiva/sclera: Conjunctivae normal.  ?   Pupils: Pupils are

## 2021-07-21 ENCOUNTER — Other Ambulatory Visit: Payer: Self-pay

## 2021-07-21 ENCOUNTER — Ambulatory Visit (INDEPENDENT_AMBULATORY_CARE_PROVIDER_SITE_OTHER): Payer: PPO

## 2021-07-21 ENCOUNTER — Encounter: Payer: Self-pay | Admitting: Family Medicine

## 2021-07-21 ENCOUNTER — Ambulatory Visit (INDEPENDENT_AMBULATORY_CARE_PROVIDER_SITE_OTHER): Payer: PPO | Admitting: Family Medicine

## 2021-07-21 VITALS — BP 100/60 | HR 63 | Temp 97.8°F | Ht 61.5 in | Wt 117.9 lb

## 2021-07-21 VITALS — BP 125/61 | HR 70 | Temp 97.2°F | Resp 15 | Ht 61.0 in | Wt 117.5 lb

## 2021-07-21 DIAGNOSIS — E0590 Thyrotoxicosis, unspecified without thyrotoxic crisis or storm: Secondary | ICD-10-CM | POA: Diagnosis not present

## 2021-07-21 DIAGNOSIS — E559 Vitamin D deficiency, unspecified: Secondary | ICD-10-CM

## 2021-07-21 DIAGNOSIS — E782 Mixed hyperlipidemia: Secondary | ICD-10-CM | POA: Diagnosis not present

## 2021-07-21 DIAGNOSIS — F419 Anxiety disorder, unspecified: Secondary | ICD-10-CM | POA: Diagnosis not present

## 2021-07-21 DIAGNOSIS — R7303 Prediabetes: Secondary | ICD-10-CM | POA: Diagnosis not present

## 2021-07-21 DIAGNOSIS — M810 Age-related osteoporosis without current pathological fracture: Secondary | ICD-10-CM

## 2021-07-21 DIAGNOSIS — Z0000 Encounter for general adult medical examination without abnormal findings: Secondary | ICD-10-CM

## 2021-07-21 DIAGNOSIS — Z1231 Encounter for screening mammogram for malignant neoplasm of breast: Secondary | ICD-10-CM

## 2021-07-21 DIAGNOSIS — Z780 Asymptomatic menopausal state: Secondary | ICD-10-CM

## 2021-07-21 MED ORDER — OMEPRAZOLE 20 MG PO CPDR: 20.0000 mg | DELAYED_RELEASE_CAPSULE | Freq: Every day | ORAL | 3 refills | Status: DC

## 2021-07-21 MED ORDER — ALPRAZOLAM 0.25 MG PO TABS: 0.2500 mg | ORAL_TABLET | Freq: Every evening | ORAL | 2 refills | Status: DC | PRN

## 2021-07-21 NOTE — Patient Instructions (Signed)
Ms. Carlini , ?Thank you for taking time to come for your Medicare Wellness Visit. I appreciate your ongoing commitment to your health goals. Please review the following plan we discussed and let me know if I can assist you in the future.  ? ?Screening recommendations/referrals: ?Colonoscopy: 06/10/15 ?Mammogram: 08/14/20, referral sent ?Bone Density: 07/17/19, referral sent ?Recommended yearly ophthalmology/optometry visit for glaucoma screening and checkup ?Recommended yearly dental visit for hygiene and checkup ? ?Vaccinations: ?Influenza vaccine: n/d ?Pneumococcal vaccine: 01/13/16 ?Tdap vaccine: 03/03/17 ?Shingles vaccine: n/d   ?Covid-19:05/26/19, 06/16/19, 02/19/20 ? ?Advanced directives: no ? ?Conditions/risks identified: none ? ?Next appointment: Follow up in one year for your annual wellness visit 07/23/22 @ 8:15am in person ? ? ?Preventive Care 7 Years and Older, Female ?Preventive care refers to lifestyle choices and visits with your health care provider that can promote health and wellness. ?What does preventive care include? ?A yearly physical exam. This is also called an annual well check. ?Dental exams once or twice a year. ?Routine eye exams. Ask your health care provider how often you should have your eyes checked. ?Personal lifestyle choices, including: ?Daily care of your teeth and gums. ?Regular physical activity. ?Eating a healthy diet. ?Avoiding tobacco and drug use. ?Limiting alcohol use. ?Practicing safe sex. ?Taking low-dose aspirin every day. ?Taking vitamin and mineral supplements as recommended by your health care provider. ?What happens during an annual well check? ?The services and screenings done by your health care provider during your annual well check will depend on your age, overall health, lifestyle risk factors, and family history of disease. ?Counseling  ?Your health care provider may ask you questions about your: ?Alcohol use. ?Tobacco use. ?Drug use. ?Emotional well-being. ?Home and  relationship well-being. ?Sexual activity. ?Eating habits. ?History of falls. ?Memory and ability to understand (cognition). ?Work and work Statistician. ?Reproductive health. ?Screening  ?You may have the following tests or measurements: ?Height, weight, and BMI. ?Blood pressure. ?Lipid and cholesterol levels. These may be checked every 5 years, or more frequently if you are over 36 years old. ?Skin check. ?Lung cancer screening. You may have this screening every year starting at age 58 if you have a 30-pack-year history of smoking and currently smoke or have quit within the past 15 years. ?Fecal occult blood test (FOBT) of the stool. You may have this test every year starting at age 39. ?Flexible sigmoidoscopy or colonoscopy. You may have a sigmoidoscopy every 5 years or a colonoscopy every 10 years starting at age 41. ?Hepatitis C blood test. ?Hepatitis B blood test. ?Sexually transmitted disease (STD) testing. ?Diabetes screening. This is done by checking your blood sugar (glucose) after you have not eaten for a while (fasting). You may have this done every 1-3 years. ?Bone density scan. This is done to screen for osteoporosis. You may have this done starting at age 67. ?Mammogram. This may be done every 1-2 years. Talk to your health care provider about how often you should have regular mammograms. ?Talk with your health care provider about your test results, treatment options, and if necessary, the need for more tests. ?Vaccines  ?Your health care provider may recommend certain vaccines, such as: ?Influenza vaccine. This is recommended every year. ?Tetanus, diphtheria, and acellular pertussis (Tdap, Td) vaccine. You may need a Td booster every 10 years. ?Zoster vaccine. You may need this after age 3. ?Pneumococcal 13-valent conjugate (PCV13) vaccine. One dose is recommended after age 5. ?Pneumococcal polysaccharide (PPSV23) vaccine. One dose is recommended after age 43. ?Talk to  your health care provider  about which screenings and vaccines you need and how often you need them. ?This information is not intended to replace advice given to you by your health care provider. Make sure you discuss any questions you have with your health care provider. ?Document Released: 05/10/2015 Document Revised: 01/01/2016 Document Reviewed: 02/12/2015 ?Elsevier Interactive Patient Education ? 2017 [place]. ? ?Fall Prevention in the Home ?Falls can cause injuries. They can happen to people of all ages. There are many things you can do to make your home safe and to help prevent falls. ?What can I do on the outside of my home? ?Regularly fix the edges of walkways and driveways and fix any cracks. ?Remove anything that might make you trip as you walk through a door, such as a raised step or threshold. ?Trim any bushes or trees on the path to your home. ?Use bright outdoor lighting. ?Clear any walking paths of anything that might make someone trip, such as rocks or tools. ?Regularly check to see if handrails are loose or broken. Make sure that both sides of any steps have handrails. ?Any raised decks and porches should have guardrails on the edges. ?Have any leaves, snow, or ice cleared regularly. ?Use sand or salt on walking paths during winter. ?Clean up any spills in your garage right away. This includes oil or grease spills. ?What can I do in the bathroom? ?Use night lights. ?Install grab bars by the toilet and in the tub and shower. Do not use towel bars as grab bars. ?Use non-skid mats or decals in the tub or shower. ?If you need to sit down in the shower, use a plastic, non-slip stool. ?Keep the floor dry. Clean up any water that spills on the floor as soon as it happens. ?Remove soap buildup in the tub or shower regularly. ?Attach bath mats securely with double-sided non-slip rug tape. ?Do not have throw rugs and other things on the floor that can make you trip. ?What can I do in the bedroom? ?Use night lights. ?Make sure  that you have a light by your bed that is easy to reach. ?Do not use any sheets or blankets that are too big for your bed. They should not hang down onto the floor. ?Have a firm chair that has side arms. You can use this for support while you get dressed. ?Do not have throw rugs and other things on the floor that can make you trip. ?What can I do in the kitchen? ?Clean up any spills right away. ?Avoid walking on wet floors. ?Keep items that you use a lot in easy-to-reach places. ?If you need to reach something above you, use a strong step stool that has a grab bar. ?Keep electrical cords out of the way. ?Do not use floor polish or wax that makes floors slippery. If you must use wax, use non-skid floor wax. ?Do not have throw rugs and other things on the floor that can make you trip. ?What can I do with my stairs? ?Do not leave any items on the stairs. ?Make sure that there are handrails on both sides of the stairs and use them. Fix handrails that are broken or loose. Make sure that handrails are as long as the stairways. ?Check any carpeting to make sure that it is firmly attached to the stairs. Fix any carpet that is loose or worn. ?Avoid having throw rugs at the top or bottom of the stairs. If you do have throw rugs, attach  them to the floor with carpet tape. ?Make sure that you have a light switch at the top of the stairs and the bottom of the stairs. If you do not have them, ask someone to add them for you. ?What else can I do to help prevent falls? ?Wear shoes that: ?Do not have high heels. ?Have rubber bottoms. ?Are comfortable and fit you well. ?Are closed at the toe. Do not wear sandals. ?If you use a stepladder: ?Make sure that it is fully opened. Do not climb a closed stepladder. ?Make sure that both sides of the stepladder are locked into place. ?Ask someone to hold it for you, if possible. ?Clearly mark and make sure that you can see: ?Any grab bars or handrails. ?First and last steps. ?Where the edge of  each step is. ?Use tools that help you move around (mobility aids) if they are needed. These include: ?Canes. ?Walkers. ?Scooters. ?Crutches. ?Turn on the lights when you go into a dark area. Replace any lig

## 2021-07-21 NOTE — Assessment & Plan Note (Signed)
Asymptomatic Recheck TSH and free T4 

## 2021-07-21 NOTE — Progress Notes (Signed)
? ?Subjective:  ? Natalie Rosales is a 76 y.o. female who presents for Medicare Annual (Subsequent) preventive examination. ? ?Review of Systems    ? ?  ? ?   ?Objective:  ?  ?Today's Vitals  ? 07/21/21 0821  ?BP: 100/60  ?Pulse: 63  ?Temp: 97.8 ?F (36.6 ?C)  ?TempSrc: Oral  ?SpO2: 99%  ?Weight: 117 lb 14.4 oz (53.5 kg)  ?Height: 5' 1.5" (1.562 m)  ? ?Body mass index is 21.92 kg/m?. ? ? ?  07/08/2020  ?  9:11 AM 12/13/2019  ?  7:30 AM 11/15/2019  ?  9:36 AM 05/23/2019  ? 11:20 AM 05/16/2018  ? 10:31 AM 06/10/2015  ?  9:15 AM  ?Advanced Directives  ?Does Patient Have a Medical Advance Directive? No No No No No No  ?Would patient like information on creating a medical advance directive? No - Patient declined No - Patient declined No - Patient declined No - Patient declined No - Patient declined No - patient declined information  ? ? ?Current Medications (verified) ?Outpatient Encounter Medications as of 07/21/2021  ?Medication Sig  ? alendronate (FOSAMAX) 70 MG tablet TAKE 1 TABLET BY MOUTH EVERY 7 (SEVEN) DAYS. TAKE WITH A FULL GLASS OF WATER ON AN EMPTY STOMACH.  ? ALPRAZolam (XANAX) 0.25 MG tablet Take 1 tablet (0.25 mg total) by mouth at bedtime as needed for anxiety.  ? celecoxib (CELEBREX) 200 MG capsule TAKE 1 CAPSULE BY MOUTH EVERY DAY  ? Collagen-Vitamin C-Biotin (COLLAGEN 1500/C PO) Take 1 tablet by mouth daily.  ? mometasone (NASONEX) 50 MCG/ACT nasal spray Place 2 sprays into the nose daily.  ? omeprazole (PRILOSEC) 20 MG capsule Take 1 capsule (20 mg total) by mouth daily. As needed only  ? ?No facility-administered encounter medications on file as of 07/21/2021.  ? ? ?Allergies (verified) ?Peanut-containing drug products and Eggs or egg-derived products  ? ?History: ?Past Medical History:  ?Diagnosis Date  ? Allergy   ? Anxiety   ? Arthritis   ? back  ? Back pain   ? lower back  ? Breast cancer ([place]) 2005  ?  left breast ca, Lumpectomy, f/u with radiation   ? Cervical cancer ([place])   ? Closed fracture of  intracapsular section of femur ([place]) 11/17/2011  ? Complete heart block ([place])   ? Pacemaker placed 2005  ? Hyperlipidemia   ? Personal history of radiation therapy   ? PONV (postoperative nausea and vomiting)   ? Presence of permanent cardiac pacemaker 06/05/2013  ? Adapta DR ADDR01  ? Wears contact lenses   ? Wears hearing aid   ? bilateral  ? ?Past Surgical History:  ?Procedure Laterality Date  ? ABDOMINAL HYSTERECTOMY    ? APPENDECTOMY  1982  ? BREAST LUMPECTOMY Left 2005  ? f/u radiation   ? CARDIAC PACEMAKER PLACEMENT  2005  ? CATARACT EXTRACTION W/PHACO Right 11/15/2019  ? Procedure: CATARACT EXTRACTION PHACO AND INTRAOCULAR LENS PLACEMENT ([place]) RIGHT;  Surgeon: Leandrew Koyanagi, MD;  Location: [place];  Service: Ophthalmology;  Laterality: Right;  8.61 ?1:12.4 ?11.9%  ? CATARACT EXTRACTION W/PHACO Left 12/13/2019  ? Procedure: CATARACT EXTRACTION PHACO AND INTRAOCULAR LENS PLACEMENT ([place]) LEFT TORIC LENS;  Surgeon: Leandrew Koyanagi, MD;  Location: [place];  Service: Ophthalmology;  Laterality: Left;  9.12 ?1:13.5 ?12.4%  ? CHOLECYSTECTOMY  1982  ? COLONOSCOPY WITH PROPOFOL N/A 06/10/2015  ? Procedure: COLONOSCOPY WITH PROPOFOL;  Surgeon: Lucilla Lame, MD;  Location: [place];  Service: Endoscopy;  Laterality: N/A;  ? HIP FRACTURE SURGERY Right 2012  ? Three pins   ? ?Family History  ?Problem Relation Age of Onset  ? Cancer Mother   ? Cancer Father   ?     Lymphoma or Pancreatic Cancer  ? Pancreatic cancer Father   ? Lymphoma Father   ? Heart disease Sister   ? Breast cancer Sister   ? Bladder Cancer Brother   ? Lymphoma Brother   ? Breast cancer Cousin   ?     2 mat cousins  ? Esophageal cancer Niece   ? ?Social History  ? ?Socioeconomic History  ? Marital status: Married  ?  Spouse name: [name]  ? Number of children: 3  ? Years of education: College  ? Highest education level: Associate degree: academic program  ?Occupational History  ? Occupation: Hair Dresser  ?   Comment: 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  ?Tobacco Use  ? Smoking status: Former  ?  Types: Cigarettes  ?  Quit date: 2005  ?  Years since quitting: 18.2  ? Smokeless tobacco: Never  ? Tobacco comments:  ?  quit 2005 (was social smoker only)  ?Vaping Use  ? Vaping Use: Never used  ?Substance and Sexual Activity  ? Alcohol use: Not Currently  ? Drug use: No  ? Sexual activity: Not Currently  ?Other Topics Concern  ? Not on file  ?Social History Narrative  ? Not on file  ? ?Social Determinants of Health  ? ?Financial Resource Strain: Not on file  ?Food Insecurity: Not on file  ?Transportation Needs: Not on file  ?Physical Activity: Not on file  ?Stress: Not on file  ?Social Connections: Not on file  ? ? ?Tobacco Counseling ?Counseling given: Not Answered ?Tobacco comments: quit 2005 (was social smoker only) ? ? ?Clinical Intake: ? ?Pre-visit preparation completed: Yes ? ?Pain : No/denies pain ? ?  ? ?Nutritional Risks: None ?Diabetes: No ? ?How often do you need to have someone help you when you read instructions, pamphlets, or other written materials from your doctor or pharmacy?: 1 - Never ? ?Diabetic?no ? ?Interpreter Needed?: No ? ?Information entered by :: Kirke Shaggy, LPN ? ? ?Activities of Daily Living ?   ? View : No data to display.  ?  ?  ?  ? ? ?Patient Care Team: ?Virginia Crews, MD as PCP - General (Family Medicine) ?Corey Skains, MD as Consulting Physician (Cardiology) ?Leandrew Koyanagi, MD as Referring Physician (Ophthalmology) ?Ree Edman, MD (Dermatology) ? ?Indicate any recent Medical Services you may have received from other than [facility] providers in the past year (date may be approximate). ? ?   ?Assessment:  ? This is a routine wellness examination for Natalie Rosales. ? ?Hearing/Vision screen ?No results found. ? ?Dietary issues and exercise activities discussed: ?  ? ? Goals Addressed   ?None ?  ? ?Depression Screen ? ?  07/08/2020  ?  9:07 AM 05/23/2019  ? 11:21 AM 05/23/2019  ? 11:14 AM 10/03/2018   ?  9:11 AM 05/16/2018  ? 10:32 AM 11/15/2017  ? 11:12 AM 09/27/2017  ? 10:05 AM  ?PHQ 2/9 Scores  ?PHQ - 2 Score 0 0 0 0 0 0 2  ?PHQ- 9 Score    '3  4 6  '$ ?  ?Fall Risk ? ?  07/08/2020  ?  9:11 AM 05/23/2019  ? 11:21 AM 05/16/2018  ? 10:32 AM 03/04/2018  ?  3:58 PM 03/03/2017  ?  9:27 AM  ?  Fall Risk   ?Falls in the past year? 0 0 0 0 No  ?Number falls in past yr: 0 0     ?Injury with Fall? 0 0     ?Follow up Falls prevention discussed      ? ? ?FALL RISK PREVENTION PERTAINING TO THE HOME: ? ?Any stairs in or around the home? Yes  ?If so, are there any without handrails? No  ?Home free of loose throw rugs in walkways, pet beds, electrical cords, etc? Yes  ?Adequate lighting in your home to reduce risk of falls? Yes  ? ?ASSISTIVE DEVICES UTILIZED TO PREVENT FALLS: ? ?Life alert? No  ?Use of a cane, walker or w/c? No  ?Grab bars in the bathroom? No  ?Shower chair or bench in shower? No  ?Elevated toilet seat or a handicapped toilet? No  ? ?TIMED UP AND GO: ? ?Was the test performed? Yes .  ?Length of time to ambulate 10 feet: 4 sec.  ? ?Gait steady and fast without use of assistive device ? ?Cognitive Function: ?  ?  ?  ? ?Immunizations ?Immunization History  ?Administered Date(s) Administered  ? PFIZER Comirnaty(Gray Top)Covid-19 Tri-Sucrose Vaccine 05/26/2019, 06/16/2019  ? PFIZER(Purple Top)SARS-COV-2 Vaccination 02/19/2020  ? Pneumococcal Conjugate-13 11/26/2014  ? Pneumococcal Polysaccharide-23 01/13/2016  ? Tdap 03/03/2017  ? ? ?TDAP status: Up to date ? ?Flu Vaccine status: Declined, Education has been provided regarding the importance of this vaccine but patient still declined. Advised may receive this vaccine at local pharmacy or Health Dept. Aware to provide a copy of the vaccination record if obtained from local pharmacy or Health Dept. Verbalized acceptance and understanding. ? ?Pneumococcal vaccine status: Up to date ? ?Covid-19 vaccine status: Completed vaccines ? ?Qualifies for Shingles Vaccine? Yes   ?Zostavax  completed No   ?Shingrix Completed?: No.    Education has been provided regarding the importance of this vaccine. Patient has been advised to call insurance company to determine out of pocket expense if they have no

## 2021-07-21 NOTE — Assessment & Plan Note (Signed)
Reviewed last lipid panel Not currently on a statin Recheck FLP and CMP Discussed diet and exercise  

## 2021-07-21 NOTE — Assessment & Plan Note (Signed)
Recommend low carb diet ?Recheck A1c - well controlled at last visit ?

## 2021-07-21 NOTE — Patient Instructions (Signed)
   The CDC recommends two doses of Shingrix (the shingles vaccine) separated by 2 to 6 months for adults age 76 years and older. I recommend checking with your insurance plan regarding coverage for this vaccine.   

## 2021-07-21 NOTE — Assessment & Plan Note (Signed)
Continue supplement Recheck level 

## 2021-07-22 LAB — HEMOGLOBIN A1C
Est. average glucose Bld gHb Est-mCnc: 117 mg/dL
Hgb A1c MFr Bld: 5.7 % — ABNORMAL HIGH (ref 4.8–5.6)

## 2021-07-22 LAB — LIPID PANEL WITH LDL/HDL RATIO
Cholesterol, Total: 218 mg/dL — ABNORMAL HIGH (ref 100–199)
HDL: 64 mg/dL (ref >39)
LDL Chol Calc (NIH): 140 mg/dL — ABNORMAL HIGH (ref 0–99)
LDL/HDL Ratio: 2.2 ratio (ref 0.0–3.2)
Triglycerides: 77 mg/dL (ref 0–149)
VLDL Cholesterol Cal: 14 mg/dL (ref 5–40)

## 2021-07-22 LAB — COMPREHENSIVE METABOLIC PANEL
ALT: 10 IU/L (ref 0–32)
AST: 24 IU/L (ref 0–40)
Albumin/Globulin Ratio: 2.2 (ref 1.2–2.2)
Albumin: 4.6 g/dL (ref 3.7–4.7)
Alkaline Phosphatase: 56 IU/L (ref 44–121)
BUN/Creatinine Ratio: 17 (ref 12–28)
BUN: 14 mg/dL (ref 8–27)
Bilirubin Total: 0.5 mg/dL (ref 0.0–1.2)
CO2: 21 mmol/L (ref 20–29)
Calcium: 9.4 mg/dL (ref 8.7–10.3)
Chloride: 104 mmol/L (ref 96–106)
Creatinine, Ser: 0.83 mg/dL (ref 0.57–1.00)
Globulin, Total: 2.1 g/dL (ref 1.5–4.5)
Glucose: 96 mg/dL (ref 70–99)
Potassium: 4.5 mmol/L (ref 3.5–5.2)
Sodium: 142 mmol/L (ref 134–144)
Total Protein: 6.7 g/dL (ref 6.0–8.5)
eGFR: 73 mL/min/{1.73_m2} (ref >59)

## 2021-07-22 LAB — TSH+FREE T4
Free T4: 1.24 ng/dL (ref 0.82–1.77)
TSH: 1.56 u[IU]/mL (ref 0.450–4.500)

## 2021-07-22 LAB — VITAMIN D 25 HYDROXY (VIT D DEFICIENCY, FRACTURES): Vit D, 25-Hydroxy: 17.6 ng/mL — ABNORMAL LOW (ref 30.0–100.0)

## 2021-07-29 DIAGNOSIS — Z961 Presence of intraocular lens: Secondary | ICD-10-CM | POA: Diagnosis not present

## 2021-08-25 DIAGNOSIS — Z872 Personal history of diseases of the skin and subcutaneous tissue: Secondary | ICD-10-CM | POA: Diagnosis not present

## 2021-08-25 DIAGNOSIS — Z85828 Personal history of other malignant neoplasm of skin: Secondary | ICD-10-CM | POA: Diagnosis not present

## 2021-08-25 DIAGNOSIS — L570 Actinic keratosis: Secondary | ICD-10-CM | POA: Diagnosis not present

## 2021-08-25 DIAGNOSIS — D485 Neoplasm of uncertain behavior of skin: Secondary | ICD-10-CM | POA: Diagnosis not present

## 2021-08-25 DIAGNOSIS — L249 Irritant contact dermatitis, unspecified cause: Secondary | ICD-10-CM | POA: Diagnosis not present

## 2021-08-25 DIAGNOSIS — L821 Other seborrheic keratosis: Secondary | ICD-10-CM | POA: Diagnosis not present

## 2021-08-25 DIAGNOSIS — D0461 Carcinoma in situ of skin of right upper limb, including shoulder: Secondary | ICD-10-CM | POA: Diagnosis not present

## 2021-08-25 DIAGNOSIS — L578 Other skin changes due to chronic exposure to nonionizing radiation: Secondary | ICD-10-CM | POA: Diagnosis not present

## 2021-09-16 ENCOUNTER — Ambulatory Visit
Admission: RE | Admit: 2021-09-16 | Discharge: 2021-09-16 | Disposition: A | Discharge status: Home / Self Care | Payer: PPO | Source: Ambulatory Visit | Attending: Family Medicine | Admitting: Family Medicine

## 2021-09-16 DIAGNOSIS — M810 Age-related osteoporosis without current pathological fracture: Secondary | ICD-10-CM | POA: Diagnosis not present

## 2021-09-16 DIAGNOSIS — Z780 Asymptomatic menopausal state: Secondary | ICD-10-CM | POA: Diagnosis not present

## 2021-09-16 DIAGNOSIS — Z1231 Encounter for screening mammogram for malignant neoplasm of breast: Secondary | ICD-10-CM | POA: Diagnosis not present

## 2021-09-16 DIAGNOSIS — M85852 Other specified disorders of bone density and structure, left thigh: Secondary | ICD-10-CM | POA: Diagnosis not present

## 2021-10-07 DIAGNOSIS — C44622 Squamous cell carcinoma of skin of right upper limb, including shoulder: Secondary | ICD-10-CM | POA: Diagnosis not present

## 2021-10-07 DIAGNOSIS — L570 Actinic keratosis: Secondary | ICD-10-CM | POA: Diagnosis not present

## 2021-10-10 ENCOUNTER — Telehealth: Payer: Self-pay | Admitting: *Deleted

## 2021-10-10 NOTE — Telephone Encounter (Signed)
Copied from [place] [id number]. Topic: General - Other >> Oct 10, 2021  8:17 AM Natalie Rosales wrote: Reason for CRM: Patient states she had a mammogram on 09-16-2021 and showed no signs of breast cancer, patient states she know feels a knot in her Lt breast x1d, patient states the knot could be scar tissue due to a previous surgery but would like if the provider and technician can take another look at her mammogram images.  Patient requesting a call back

## 2021-10-10 NOTE — Progress Notes (Unsigned)
      Established patient visit   Patient: Natalie Rosales   DOB: November 11, 1945   76 y.o. Female  MRN: [id number] Visit Date: 10/13/2021  Today's healthcare provider: Shirlee Latch, MD   No chief complaint on file.  Subjective    HPI  Lump:Patient states she had a mammogram on 09-16-2021 and showed no signs of breast cancer, patient states she know feels a knot in her Lt breast x3-4d, patient states the knot could be scar tissue due to a previous surgery.  Medications: Outpatient Medications Prior to Visit  Medication Sig   alendronate (FOSAMAX) 70 MG tablet TAKE 1 TABLET BY MOUTH EVERY 7 (SEVEN) DAYS. TAKE WITH A FULL GLASS OF WATER ON AN EMPTY STOMACH.   ALPRAZolam (XANAX) 0.25 MG tablet Take 1 tablet (0.25 mg total) by mouth at bedtime as needed for anxiety.   celecoxib (CELEBREX) 200 MG capsule TAKE 1 CAPSULE BY MOUTH EVERY DAY   Collagen-Vitamin C-Biotin (COLLAGEN 1500/C PO) Take 1 tablet by mouth daily.   mometasone (NASONEX) 50 MCG/ACT nasal spray Place 2 sprays into the nose daily.   omeprazole (PRILOSEC) 20 MG capsule Take 1 capsule (20 mg total) by mouth daily. As needed only   No facility-administered medications prior to visit.    Review of Systems  {Labs  Heme  Chem  Endocrine  Serology  Results Review (optional):23779}   Objective    There were no vitals taken for this visit. {Show previous vital signs (optional):23777}  Physical Exam  ***  No results found for any visits on 10/13/21.  Assessment & Plan     ***  No follow-ups on file.      {provider attestation***:1}   Shirlee Latch, MD  [facility] [phone] (phone) [phone] (fax)  [facility]

## 2021-10-10 NOTE — Telephone Encounter (Signed)
Called patient and advised. Patient requested to come see Dr. Jacinto Rosales on Monday

## 2021-10-10 NOTE — Telephone Encounter (Signed)
She would need to call radiology at [facility] to have them see if they can look at it again. We don't read mammograms here.  When in doubt, we could examine and do diagnostic imaging.

## 2021-10-13 ENCOUNTER — Ambulatory Visit (INDEPENDENT_AMBULATORY_CARE_PROVIDER_SITE_OTHER): Payer: PPO | Admitting: Family Medicine

## 2021-10-13 ENCOUNTER — Encounter: Payer: Self-pay | Admitting: Family Medicine

## 2021-10-13 VITALS — BP 134/83 | HR 68 | Temp 98.0°F | Resp 16 | Wt 114.4 lb

## 2021-10-13 DIAGNOSIS — N6322 Unspecified lump in the left breast, upper inner quadrant: Secondary | ICD-10-CM

## 2021-10-13 NOTE — Progress Notes (Signed)
I,Sulibeya S Dimas,acting as a scribe for Lavon Paganini, MD.,have documented all relevant documentation on the behalf of Lavon Paganini, MD,as directed by  Lavon Paganini, MD while in the presence of Lavon Paganini, MD.   Established patient visit   Patient: Natalie Rosales   DOB: January 30, 1946   76 y.o. Female  MRN: [id number] Visit Date: 10/13/2021  Today's healthcare provider: Lavon Paganini, MD   Chief Complaint  Patient presents with   Breast Problem   Subjective    HPI  Lump:Patient states she had a mammogram on 09-16-2021 and showed no signs of breast cancer, patient states she know feels a knot in her Lt breast x3-4d, patient states the knot could be scar tissue due to a previous surgery. Patient denies any rash or abnormal discharge.   Medications: Outpatient Medications Prior to Visit  Medication Sig   alendronate (FOSAMAX) 70 MG tablet TAKE 1 TABLET BY MOUTH EVERY 7 (SEVEN) DAYS. TAKE WITH A FULL GLASS OF WATER ON AN EMPTY STOMACH.   ALPRAZolam (XANAX) 0.25 MG tablet Take 1 tablet (0.25 mg total) by mouth at bedtime as needed for anxiety.   celecoxib (CELEBREX) 200 MG capsule TAKE 1 CAPSULE BY MOUTH EVERY DAY   mometasone (NASONEX) 50 MCG/ACT nasal spray Place 2 sprays into the nose daily.   omeprazole (PRILOSEC) 20 MG capsule Take 1 capsule (20 mg total) by mouth daily. As needed only   [DISCONTINUED] Collagen-Vitamin C-Biotin (COLLAGEN 1500/C PO) Take 1 tablet by mouth daily.   No facility-administered medications prior to visit.    Review of Systems  Constitutional:  Positive for fatigue and unexpected weight change. Negative for appetite change, chills and fever.  Respiratory:  Negative for chest tightness and shortness of breath.   Cardiovascular:  Negative for chest pain and palpitations.  Skin:  Positive for rash.        Objective    BP 134/83 (BP Location: Right Arm, Patient Position: Sitting, Cuff Size: Normal)   Pulse 68   Temp 98 F  (36.7 C) (Oral)   Resp 16   Wt 114 lb 6.4 oz (51.9 kg)   BMI 21.62 kg/m  BP Readings from Last 3 Encounters:  10/13/21 134/83  07/21/21 125/61  07/21/21 100/60   Wt Readings from Last 3 Encounters:  10/13/21 114 lb 6.4 oz (51.9 kg)  07/21/21 117 lb 8 oz (53.3 kg)  07/21/21 117 lb 14.4 oz (53.5 kg)      Physical Exam Vitals reviewed.  Constitutional:      General: She is not in acute distress.    Appearance: She is well-developed.  HENT:     Head: Normocephalic and atraumatic.  Eyes:     General: No scleral icterus.    Conjunctiva/sclera: Conjunctivae normal.  Cardiovascular:     Rate and Rhythm: Normal rate and regular rhythm.  Pulmonary:     Effort: Pulmonary effort is normal. No respiratory distress.  Chest:  Breasts:    Breasts are symmetrical.     Right: Inverted nipple present. No mass, nipple discharge, skin change or tenderness.     Left: Mass (1cm lump noted superior to scar from previous surgery around 10 oclock position of L breast, mobile, tender) and tenderness present. No nipple discharge or skin change.  Lymphadenopathy:     Upper Body:     Right upper body: No supraclavicular, axillary or pectoral adenopathy.     Left upper body: No supraclavicular, axillary or pectoral adenopathy.  Skin:  General: Skin is warm and dry.     Findings: No rash.  Neurological:     Mental Status: She is alert and oriented to person, place, and time.  Psychiatric:        Behavior: Behavior normal.       No results found for any visits on 10/13/21.  Assessment & Plan     1. Mass of upper inner quadrant of left breast - new problem - had normal screening mammogram less than 1 month ago, but this lump is clearly palpable - Discussed that it may be scar tissue given its proximity to her previous scar, but as it is newly palpable, I do think it is worthwhile to get diagnostic imaging including ultrasound and diagnostic mammogram to evaluate further - Next steps  pending imaging - MM DIAG BREAST TOMO UNI LEFT; Future - US BREAST LTD UNI LEFT INC AXILLA; Future   No follow-ups on file.      I, Lavon Paganini, MD, have reviewed all documentation for this visit. The documentation on 10/13/21 for the exam, diagnosis, procedures, and orders are all accurate and complete.   Shedric Fredericks, Dionne Bucy, MD, MPH [facility]

## 2021-10-17 ENCOUNTER — Other Ambulatory Visit: Payer: PPO

## 2021-11-03 ENCOUNTER — Ambulatory Visit
Admission: RE | Admit: 2021-11-03 | Discharge: 2021-11-03 | Disposition: A | Discharge status: Home / Self Care | Payer: PPO | Source: Ambulatory Visit | Attending: Family Medicine | Admitting: Family Medicine

## 2021-11-03 DIAGNOSIS — N6322 Unspecified lump in the left breast, upper inner quadrant: Secondary | ICD-10-CM

## 2021-11-03 DIAGNOSIS — R922 Inconclusive mammogram: Secondary | ICD-10-CM | POA: Diagnosis not present

## 2021-11-04 ENCOUNTER — Other Ambulatory Visit: Payer: Self-pay | Admitting: Family Medicine

## 2021-11-04 DIAGNOSIS — N6322 Unspecified lump in the left breast, upper inner quadrant: Secondary | ICD-10-CM

## 2021-11-04 NOTE — Progress Notes (Signed)
Biopsy will be needed for completion of breast cancer screening given palpable mass. Patient should be contacted with next steps for scheduling.  Gwyneth Sprout, [address] [place], [place] [address] [phone] (phone) [phone] (fax) [place]

## 2021-11-13 ENCOUNTER — Other Ambulatory Visit: Payer: Self-pay | Admitting: Family Medicine

## 2021-11-19 ENCOUNTER — Ambulatory Visit
Admission: RE | Admit: 2021-11-19 | Discharge: 2021-11-19 | Disposition: A | Discharge status: Home / Self Care | Payer: PPO | Source: Ambulatory Visit | Attending: Family Medicine | Admitting: Family Medicine

## 2021-11-19 DIAGNOSIS — N6322 Unspecified lump in the left breast, upper inner quadrant: Secondary | ICD-10-CM | POA: Diagnosis not present

## 2021-11-19 DIAGNOSIS — N6082 Other benign mammary dysplasias of left breast: Secondary | ICD-10-CM | POA: Diagnosis not present

## 2021-11-19 HISTORY — PX: BREAST BIOPSY: SHX20

## 2021-11-20 LAB — SURGICAL PATHOLOGY

## 2022-01-14 DIAGNOSIS — I442 Atrioventricular block, complete: Secondary | ICD-10-CM | POA: Diagnosis not present

## 2022-04-08 DIAGNOSIS — D492 Neoplasm of unspecified behavior of bone, soft tissue, and skin: Secondary | ICD-10-CM | POA: Diagnosis not present

## 2022-04-08 DIAGNOSIS — L853 Xerosis cutis: Secondary | ICD-10-CM | POA: Diagnosis not present

## 2022-04-08 DIAGNOSIS — D225 Melanocytic nevi of trunk: Secondary | ICD-10-CM | POA: Diagnosis not present

## 2022-04-08 DIAGNOSIS — L578 Other skin changes due to chronic exposure to nonionizing radiation: Secondary | ICD-10-CM | POA: Diagnosis not present

## 2022-04-08 DIAGNOSIS — Z859 Personal history of malignant neoplasm, unspecified: Secondary | ICD-10-CM | POA: Diagnosis not present

## 2022-04-08 DIAGNOSIS — Z872 Personal history of diseases of the skin and subcutaneous tissue: Secondary | ICD-10-CM | POA: Diagnosis not present

## 2022-04-08 DIAGNOSIS — Z85828 Personal history of other malignant neoplasm of skin: Secondary | ICD-10-CM | POA: Diagnosis not present

## 2022-04-08 DIAGNOSIS — D485 Neoplasm of uncertain behavior of skin: Secondary | ICD-10-CM | POA: Diagnosis not present

## 2022-04-13 NOTE — Progress Notes (Signed)
I,[name],acting as a Neurosurgeon for Natalie Latch, MD.,have documented all relevant documentation on the behalf of Natalie Latch, MD,as directed by  Natalie Latch, MD while in the presence of Natalie Latch, MD.   Established patient visit   Patient: Natalie Rosales   DOB: 20-Apr-1946   76 y.o. Female  MRN: [id number] Visit Date: 04/14/2022  Today's healthcare provider: Shirlee Latch, MD   Chief Complaint  Patient presents with   Hip Pain   Subjective    HPI  Right hip was hurt 12-13 years ago and has been giving the patient more pain. States she has a hard time getting back up once sitting down and even going up steps. She had broken this hip before. Feels it in the joint.  Patient also has concerns with remembering things lately. States its even things as small as what days it is. Wondering if it is memory or anxiety from stress.  Reports there is a lot going on with stress over the last year. Was taking care of her sister and now her husband. Having trouble concentrating. Had lost weight and now it is back to baseline.  Forgets what she did the day before.     04/14/2022    8:23 AM 10/03/2018    9:09 AM 03/04/2018    4:34 PM 11/15/2017   11:12 AM  GAD 7 : Generalized Anxiety Score  Nervous, Anxious, on Edge 2 1 0 0  Control/stop worrying 1 1 0 1  Worry too much - different things 1 1 0 1  Trouble relaxing 1 0 0 0  Restless 1 1 0 0  Easily annoyed or irritable 1 1 1 1   Afraid - awful might happen 1 1 1 1   Total GAD 7 Score 8 6 2 4   Anxiety Difficulty Not difficult at all Not difficult at all Not difficult at all Not difficult at all        10/13/2021   10:20 AM 07/21/2021    8:27 AM 07/08/2020    9:07 AM 05/23/2019   11:21 AM 05/23/2019   11:14 AM  Depression screen PHQ 2/9  Decreased Interest 0 0 0 0 0  Down, Depressed, Hopeless 0 0 0 0 0  PHQ - 2 Score 0 0 0 0 0  Altered sleeping 0      Tired, decreased energy 1      Change in appetite 1       Feeling bad or failure about yourself  0      Trouble concentrating 1      Moving slowly or fidgety/restless 0      Suicidal thoughts 0      PHQ-9 Score 3      Difficult doing work/chores Not difficult at all          04/14/2022    9:02 AM  MMSE - Mini Mental State Exam  Orientation to time 5  Orientation to Place 5  Registration 3  Attention/ Calculation 5  Recall 3  Language- name 2 objects 2  Language- repeat 1  Language- follow 3 step command 3  Language- read & follow direction 1  Write a sentence 1  Copy design 1  Total score 30     Medications: Outpatient Medications Prior to Visit  Medication Sig   alendronate (FOSAMAX) 70 MG tablet TAKE 1 TABLET BY MOUTH EVERY 7 (SEVEN) DAYS. TAKE WITH A FULL GLASS OF WATER ON AN EMPTY STOMACH.   ALPRAZolam (XANAX) 0.25  MG tablet Take 1 tablet (0.25 mg total) by mouth at bedtime as needed for anxiety.   celecoxib (CELEBREX) 200 MG capsule TAKE 1 CAPSULE BY MOUTH EVERY DAY   mometasone (NASONEX) 50 MCG/ACT nasal spray Place 2 sprays into the nose daily.   omeprazole (PRILOSEC) 20 MG capsule Take 1 capsule (20 mg total) by mouth daily. As needed only   No facility-administered medications prior to visit.    Review of Systems per HPI     Objective    BP 136/67 (BP Location: Right Arm, Patient Position: Sitting, Cuff Size: Normal)   Pulse 66   Wt 114 lb 9.6 oz (52 kg)   SpO2 100%   BMI 21.65 kg/m    Physical Exam Vitals reviewed.  Constitutional:      General: She is not in acute distress.    Appearance: Normal appearance. She is well-developed. She is not diaphoretic.  HENT:     Head: Normocephalic and atraumatic.  Eyes:     General: No scleral icterus.    Conjunctiva/sclera: Conjunctivae normal.  Neck:     Thyroid: No thyromegaly.  Cardiovascular:     Rate and Rhythm: Normal rate and regular rhythm.     Pulses: Normal pulses.     Heart sounds: Normal heart sounds. No murmur heard. Pulmonary:     Effort:  Pulmonary effort is normal. No respiratory distress.     Breath sounds: Normal breath sounds. No wheezing, rhonchi or rales.  Musculoskeletal:     Cervical back: Neck supple.     Right lower leg: No edema.     Left lower leg: No edema.  Lymphadenopathy:     Cervical: No cervical adenopathy.  Skin:    General: Skin is warm and dry.     Findings: No rash.  Neurological:     Mental Status: She is alert and oriented to person, place, and time. Mental status is at baseline.  Psychiatric:        Mood and Affect: Mood normal.        Behavior: Behavior normal.       No results found for any visits on 04/14/22.  Assessment & Plan     Problem List Items Addressed This Visit       Other   Adjustment disorder with mixed anxiety and depressed mood    New problem A lot of caregiver stress related to caring for her husband and also her sister She often feels overwhelmed and at the edge of breaking down if one more thing is added on This may be cotnributing to pseudodementia as she complains mostly of lack of attention on tasks that may be more related to feeling overwhelmed than true memory issues Will Start zoloft 50 mg daily On further chart review, appears that she has taken lexapro and done well several years ago, so could consider this option too Discussed potential side effects, incl GI upset, sexual dysfunction, and SI Discussed that it can take 6-8 weeks to reach full efficacy Contracted for safety - no SI/HI Discussed synergistic effects of medications and therapy       Right hip pain - Primary    Patient would like to work on stress reduction before pursuing imaging or further management at this time I did discuss the data behind taking scheduled Tylenol and possibly decreasing NSAID needs      Caregiver stress    See plan above for adjustment disorder      Memory change  Suspect this may be pseudodementia She does score 30 out of 30 on MMSE easily She is only having  issues in the setting of significant stress as above We will treat her adjustment disorder and see if her memory does improve We may consider dementia labs in the future        Return for as scheduled.      I, Natalie Latch, MD, have reviewed all documentation for this visit. The documentation on 04/14/22 for the exam, diagnosis, procedures, and orders are all accurate and complete.   Pama Roskos, Marzella Schlein, MD, MPH [facility]

## 2022-04-14 ENCOUNTER — Encounter: Payer: Self-pay | Admitting: Family Medicine

## 2022-04-14 ENCOUNTER — Ambulatory Visit (INDEPENDENT_AMBULATORY_CARE_PROVIDER_SITE_OTHER): Payer: PPO | Admitting: Family Medicine

## 2022-04-14 VITALS — BP 136/67 | HR 66 | Wt 114.6 lb

## 2022-04-14 DIAGNOSIS — F4323 Adjustment disorder with mixed anxiety and depressed mood: Secondary | ICD-10-CM

## 2022-04-14 DIAGNOSIS — R413 Other amnesia: Secondary | ICD-10-CM

## 2022-04-14 DIAGNOSIS — Z636 Dependent relative needing care at home: Secondary | ICD-10-CM | POA: Insufficient documentation

## 2022-04-14 DIAGNOSIS — M25551 Pain in right hip: Secondary | ICD-10-CM | POA: Diagnosis not present

## 2022-04-14 MED ORDER — SERTRALINE HCL 50 MG PO TABS: 50.0000 mg | ORAL_TABLET | Freq: Every day | ORAL | 3 refills | Status: DC

## 2022-04-14 NOTE — Assessment & Plan Note (Signed)
Suspect this may be pseudodementia She does score 30 out of 30 on MMSE easily She is only having issues in the setting of significant stress as above We will treat her adjustment disorder and see if her memory does improve We may consider dementia labs in the future

## 2022-04-14 NOTE — Assessment & Plan Note (Signed)
New problem A lot of caregiver stress related to caring for her husband and also her sister She often feels overwhelmed and at the edge of breaking down if one more thing is added on This may be cotnributing to pseudodementia as she complains mostly of lack of attention on tasks that may be more related to feeling overwhelmed than true memory issues Will Start zoloft 50 mg daily On further chart review, appears that she has taken lexapro and done well several years ago, so could consider this option too Discussed potential side effects, incl GI upset, sexual dysfunction, and SI Discussed that it can take 6-8 weeks to reach full efficacy Contracted for safety - no SI/HI Discussed synergistic effects of medications and therapy

## 2022-04-14 NOTE — Assessment & Plan Note (Signed)
See plan above for adjustment disorder

## 2022-04-14 NOTE — Assessment & Plan Note (Signed)
Patient would like to work on stress reduction before pursuing imaging or further management at this time I did discuss the data behind taking scheduled Tylenol and possibly decreasing NSAID needs

## 2022-04-23 ENCOUNTER — Ambulatory Visit: Payer: Self-pay

## 2022-04-23 NOTE — Telephone Encounter (Signed)
  Chief Complaint: medication problem  Symptoms: nausea, sweating and stomach upset Frequency: since 04/14/22 Pertinent Negatives: NA Disposition: '[]'$ ED /'[]'$ Urgent Care (no appt availability in office) / '[]'$ Appointment(In office/virtual)/ '[]'$  [place] Virtual Care/ '[x]'$ Home Care/ '[]'$ Refused Recommended Disposition /'[]'$  Mobile Bus/ '[]'$  Follow-up with PCP Additional Notes: pt states she has stopped the sertraline d/t SE she has been experiencing. Has gotten worse further along she does and just decided not to continue taking. Pt states she doesn't feel as bad as she did when she came in for OV and wants to just wait until March to FU with Dr. B. Pt not interested in trying something different at this time. Advised I would send message to Dr. Jacinto Reap as Juluis Rainier and if has any sx prior to March appt and needs to [facility] or make appt to not hesitate to do so. Pt verbalized understanding.   Summary: Not tolerating Zoloft   The patient called in stating she is not tolerating the sertraline (ZOLOFT) 50 MG tablet well. She is having nausea, sweating and upset stomach. She has stopped taking the medication and wanted her provider to know. She has a follow up appt in March and will discuss this further then unless the provider needs to talk with her before then. Please assist patient further.         Reason for Disposition  Caller has medicine question, adult has minor symptoms, caller declines triage, AND triager answers question  Answer Assessment - Initial Assessment Questions 1. NAME of MEDICINE: "What medicine(s) are you calling about?"     sertraline 2. QUESTION: "What is your question?" (e.g., double dose of medicine, side effect)     Not tolerating well and has stopped medication 3. PRESCRIBER: "Who prescribed the medicine?" Reason: if prescribed by specialist, call should be referred to that group.     Dr. Brita Romp 4. SYMPTOMS: "Do you have any symptoms?" If Yes, ask: "What symptoms are you having?"   "How bad are the symptoms (e.g., mild, moderate, severe)     Nausea, sweating and stomach upset  Protocols used: Medication Question Call-A-AH

## 2022-04-28 NOTE — Telephone Encounter (Signed)
Noted  

## 2022-04-30 ENCOUNTER — Ambulatory Visit: Payer: Self-pay

## 2022-04-30 NOTE — Telephone Encounter (Signed)
Ria Comment has 240 appt open - would work for this acute.

## 2022-04-30 NOTE — Telephone Encounter (Signed)
Please advise 

## 2022-04-30 NOTE — Telephone Encounter (Signed)
Message from [place] sent at 04/30/2022  8:34 AM EST  Summary: itching , burning and pressure to urinate but nothing barely comes   Patient called in has itching , burning and pressure to urinate but nothing barely comes , doesn't want any appt. She is asking for med to be sent to pharmacy. [id number] [place] [place]: [phone]: [phone]        Chief Complaint: burning with urination Symptoms: urgency, back pain Frequency: Sunday Pertinent Negatives: Patient denies fever, blood in urine (was taking Azo), vaginal discharge Disposition: '[]'$ ED /'[]'$ Urgent Care (no appt availability in office) / '[x]'$ Appointment(In office/virtual)/ '[]'$  [place] Virtual Care/ '[]'$ Home Care/ '[]'$ Refused Recommended Disposition /'[]'$ [place] Mobile Bus/ '[]'$  Follow-up with PCP Additional Notes: refused to go to U/C due to work. Advised to go to U/C for worseing sx. Pt able to come in tomorrow . Appt made.   Reason for Disposition  Side (flank) or lower back pain present  Answer Assessment - Initial Assessment Questions 1. SEVERITY: "How bad is the pain?"  (e.g., Scale 1-10; mild, moderate, or severe)   - MILD (1-3): complains slightly about urination hurting   - MODERATE (4-7): interferes with normal activities     - SEVERE (8-10): excruciating, unwilling or unable to urinate because of the pain      moderate 2. FREQUENCY: "How many times have you had painful urination today?"      4 times 3. PATTERN: "Is pain present every time you urinate or just sometimes?"      Every time 4. ONSET: "When did the painful urination start?"      Sunday  5. FEVER: "Do you have a fever?" If Yes, ask: "What is your temperature, how was it measured, and when did it start?"     no 6. PAST UTI: "Have you had a urine infection before?" If Yes, ask: "When was the last time?" and "What happened that time?"      Yes, urgency 7. CAUSE: "What do you think is causing the painful urination?"   (e.g., UTI, scratch, Herpes sore)     UTI 8. OTHER SYMPTOMS: "Do you have any other symptoms?" (e.g., blood in urine, flank pain, genital sores, urgency, vaginal discharge)     Itching, pressure, back pain, no vaginal discharge 9. PREGNANCY: "Is there any chance you are pregnant?" "When was your last menstrual period?"     N/a  Protocols used: Urination Pain - Female-A-AH

## 2022-05-01 ENCOUNTER — Ambulatory Visit (INDEPENDENT_AMBULATORY_CARE_PROVIDER_SITE_OTHER): Payer: PPO | Admitting: Physician Assistant

## 2022-05-01 ENCOUNTER — Encounter: Payer: Self-pay | Admitting: Physician Assistant

## 2022-05-01 ENCOUNTER — Other Ambulatory Visit: Payer: Self-pay | Admitting: Family Medicine

## 2022-05-01 VITALS — BP 126/76 | HR 87 | Temp 98.2°F | Resp 16 | Wt 115.6 lb

## 2022-05-01 DIAGNOSIS — R300 Dysuria: Secondary | ICD-10-CM

## 2022-05-01 LAB — POCT URINALYSIS DIPSTICK
Bilirubin, UA: NEGATIVE
Blood, UA: NEGATIVE
Glucose, UA: NEGATIVE
Ketones, UA: NEGATIVE
Leukocytes, UA: NEGATIVE
Nitrite, UA: NEGATIVE
Protein, UA: NEGATIVE
Spec Grav, UA: 1.01 (ref 1.010–1.025)
Urobilinogen, UA: 0.2 E.U./dL
pH, UA: 6 (ref 5.0–8.0)

## 2022-05-01 NOTE — Telephone Encounter (Signed)
Requested Prescriptions  Pending Prescriptions Disp Refills   alendronate (FOSAMAX) 70 MG tablet [Pharmacy Med Name: ALENDRONATE SODIUM 70 MG TAB] 12 tablet 0    Sig: TAKE 1 TABLET BY MOUTH EVERY 7 (SEVEN) DAYS. TAKE WITH A FULL GLASS OF WATER ON AN EMPTY STOMACH.     Endocrinology:  Bisphosphonates Failed - 05/01/2022  1:35 AM      Failed - Vitamin D in normal range and within 360 days    Vit D, 25-Hydroxy  Date Value Ref Range Status  07/21/2021 17.6 (L) 30.0 - 100.0 ng/mL Final    Comment:    Vitamin D deficiency has been defined by the [place] practice guideline as a level of serum 25-OH vitamin D less than 20 ng/mL (1,2). The Endocrine Society went on to further define vitamin D insufficiency as a level between 21 and 29 ng/mL (2). 1. IOM (Institute of Medicine). 2010. Dietary reference    intakes for calcium and D. [place]: The    [employer]. 2. Holick MF, Binkley Penton, Bischoff-Ferrari HA, et al.    Evaluation, treatment, and prevention of vitamin D    deficiency: an Endocrine Society clinical practice    guideline. JCEM. 2011 Jul; 96(7):1911-30.          Failed - Mg Level in normal range and within 360 days    No results found for: "MG"       Failed - Phosphate in normal range and within 360 days    No results found for: "PHOS"       Passed - Ca in normal range and within 360 days    Calcium  Date Value Ref Range Status  07/21/2021 9.4 8.7 - 10.3 mg/dL Final   Calcium, Total  Date Value Ref Range Status  05/29/2014 9.1 8.5 - 10.1 mg/dL Final         Passed - Cr in normal range and within 360 days    Creatinine  Date Value Ref Range Status  05/29/2014 0.96 0.60 - 1.30 mg/dL Final   Creatinine, Ser  Date Value Ref Range Status  07/21/2021 0.83 0.57 - 1.00 mg/dL Final         Passed - eGFR is 30 or above and within 360 days    EGFR (African American)  Date Value Ref Range Status  05/29/2014 >60  >22m/min Final   GFR calc Af Amer  Date Value Ref Range Status  07/05/2019 85 >59 mL/min/1.73 Final   EGFR (Non-African Amer.)  Date Value Ref Range Status  05/29/2014 >60 >637mmin Final    Comment:    eGFR values <6040min/1.73 m2 may be an indication of chronic kidney disease (CKD). Calculated eGFR, using the MRDR Study equation, is useful in  patients with stable renal function. The eGFR calculation will not be reliable in acutely ill patients when serum creatinine is changing rapidly. It is not useful in patients on dialysis. The eGFR calculation may not be applicable to patients at the low and high extremes of body sizes, pregnant women, and vegetarians.    GFR calc non Af Amer  Date Value Ref Range Status  07/05/2019 74 >59 mL/min/1.73 Final   eGFR  Date Value Ref Range Status  07/21/2021 73 >59 mL/min/1.73 Final         Passed - Valid encounter within last 12 months    Recent Outpatient Visits           2 weeks ago Right  hip pain   [facility], Dionne Bucy, MD   6 months ago Mass of upper inner quadrant of left breast   [facility] [employer], Dionne Bucy, MD   9 months ago Encounter for annual physical exam   [facility] [place], Dionne Bucy, MD   1 year ago Encounter for annual physical exam   [facility], Dionne Bucy, MD   2 years ago Encounter for annual physical exam   [facility], Dionne Bucy, MD       Future Appointments             Today Mikey Kirschner, PA-C [facility] [place], [place]   In 2 months Bacigalupo, Dionne Bucy, MD [facility], [facility]            Passed - Bone Mineral Density or Dexa Scan completed in the last 2 years

## 2022-05-01 NOTE — Progress Notes (Signed)
I,Sulibeya S Dimas,acting as a Education administrator for [employer], PA-C.,have documented all relevant documentation on the behalf of Mikey Kirschner, PA-C,as directed by  Mikey Kirschner, PA-C while in the presence of Mikey Kirschner, PA-C.     Established patient visit   Patient: Natalie Rosales   DOB: October 22, 1945   77 y.o. Female  MRN: [id number] Visit Date: 05/01/2022  Today's healthcare provider: Mikey Kirschner, PA-C   Chief Complaint  Patient presents with   Urinary Tract Infection   Subjective    HPI  Urinary symptoms  She reports new onset dysuria and pressure symptoms were present only yesterday.. She reports flank pain present yesterday only.  The current episode started yesterday and is nearly resolved. Patient states symptoms are mild in intensity. She  has not been recently treated for similar symptoms.  Pt reports all symptoms have resolved today.  Denies fever, chills, body aches. ---------------------------------------------------------------------------------------   Medications: Outpatient Medications Prior to Visit  Medication Sig   alendronate (FOSAMAX) 70 MG tablet TAKE 1 TABLET BY MOUTH EVERY 7 (SEVEN) DAYS. TAKE WITH A FULL GLASS OF WATER ON AN EMPTY STOMACH.   ALPRAZolam (XANAX) 0.25 MG tablet Take 1 tablet (0.25 mg total) by mouth at bedtime as needed for anxiety.   celecoxib (CELEBREX) 200 MG capsule TAKE 1 CAPSULE BY MOUTH EVERY DAY   mometasone (NASONEX) 50 MCG/ACT nasal spray Place 2 sprays into the nose daily.   omeprazole (PRILOSEC) 20 MG capsule Take 1 capsule (20 mg total) by mouth daily. As needed only   [DISCONTINUED] sertraline (ZOLOFT) 50 MG tablet Take 1 tablet (50 mg total) by mouth daily. (Patient not taking: Reported on 05/01/2022)   No facility-administered medications prior to visit.    Review of Systems  Constitutional:  Negative for chills and fever.  Respiratory:  Negative for chest tightness.   Cardiovascular:  Negative for chest pain.   Gastrointestinal:  Negative for abdominal pain, constipation, diarrhea, nausea and vomiting.  Genitourinary:  Positive for dysuria and flank pain. Negative for hematuria.     Objective    BP 126/76 (BP Location: Right Arm, Patient Position: Sitting, Cuff Size: Normal)   Pulse 87   Temp 98.2 F (36.8 C) (Oral)   Resp 16   Wt 115 lb 9.6 oz (52.4 kg)   BMI 21.84 kg/m  BP Readings from Last 3 Encounters:  05/01/22 126/76  04/14/22 136/67  10/13/21 134/83   Wt Readings from Last 3 Encounters:  05/01/22 115 lb 9.6 oz (52.4 kg)  04/14/22 114 lb 9.6 oz (52 kg)  10/13/21 114 lb 6.4 oz (51.9 kg)      Physical Exam Vitals reviewed.  Constitutional:      Appearance: She is not ill-appearing.  HENT:     Head: Normocephalic.  Eyes:     Conjunctiva/sclera: Conjunctivae normal.  Cardiovascular:     Rate and Rhythm: Normal rate.  Pulmonary:     Effort: Pulmonary effort is normal. No respiratory distress.  Neurological:     General: No focal deficit present.     Mental Status: She is alert and oriented to person, place, and time.  Psychiatric:        Mood and Affect: Mood normal.        Behavior: Behavior normal.      Results for orders placed or performed in visit on 05/01/22  POCT urinalysis dipstick  Result Value Ref Range   Color, UA yellow    Clarity, UA clear    Glucose, UA  Negative Negative   Bilirubin, UA Negative    Ketones, UA Negative    Spec Grav, UA 1.010 1.010 - 1.025   Blood, UA Negative    pH, UA 6.0 5.0 - 8.0   Protein, UA Negative Negative   Urobilinogen, UA 0.2 0.2 or 1.0 E.U./dL   Nitrite, UA Negative    Leukocytes, UA Negative Negative    Assessment & Plan     Dysuria Poc UA negative leuk, blood Sending for culture Will wait for results to treat given current resolution of symptoms  Return if symptoms worsen or fail to improve.      I, Mikey Kirschner, PA-C have reviewed all documentation for this visit. The documentation on  05/01/22 for the  exam, diagnosis, procedures, and orders are all accurate and complete.  Mikey Kirschner, PA-C [facility] [address] [place], [place], [address] Office: [phone] Fax: [place]

## 2022-05-04 LAB — URINE CULTURE

## 2022-05-07 ENCOUNTER — Encounter: Payer: Self-pay | Admitting: Family Medicine

## 2022-05-07 ENCOUNTER — Ambulatory Visit (INDEPENDENT_AMBULATORY_CARE_PROVIDER_SITE_OTHER): Payer: PPO | Admitting: Family Medicine

## 2022-05-07 VITALS — BP 135/76 | HR 81 | Temp 97.8°F | Resp 16 | Wt 112.3 lb

## 2022-05-07 DIAGNOSIS — J069 Acute upper respiratory infection, unspecified: Secondary | ICD-10-CM

## 2022-05-07 LAB — POC COVID19 BINAXNOW: SARS Coronavirus 2 Ag: NEGATIVE

## 2022-05-07 NOTE — Progress Notes (Signed)
I,Sulibeya S Dimas,acting as a scribe for Lavon Paganini, MD.,have documented all relevant documentation on the behalf of Lavon Paganini, MD,as directed by  Lavon Paganini, MD while in the presence of Lavon Paganini, MD.     Established patient visit   Patient: Natalie Rosales   DOB: 08/06/1945   77 y.o. Female  MRN: [id number] Visit Date: 05/07/2022  Today's healthcare provider: Lavon Paganini, MD   Chief Complaint  Patient presents with   URI   Subjective    Upper respiratory symptoms She complains of bilateral ear pressure/pain, congestion, productive cough with  yellow colored sputum, and sore throat.with no fever, chills, night sweats or weight loss. Onset of symptoms was  5 days ago and gradually improving.She is drinking plenty of fluids.  Past history is significant for no history of pneumonia or bronchitis.   ---------------------------------------------------------------------------------------------------   Medications: Outpatient Medications Prior to Visit  Medication Sig   alendronate (FOSAMAX) 70 MG tablet TAKE 1 TABLET BY MOUTH EVERY 7 (SEVEN) DAYS. TAKE WITH A FULL GLASS OF WATER ON AN EMPTY STOMACH.   ALPRAZolam (XANAX) 0.25 MG tablet Take 1 tablet (0.25 mg total) by mouth at bedtime as needed for anxiety.   celecoxib (CELEBREX) 200 MG capsule TAKE 1 CAPSULE BY MOUTH EVERY DAY   mometasone (NASONEX) 50 MCG/ACT nasal spray Place 2 sprays into the nose daily.   omeprazole (PRILOSEC) 20 MG capsule Take 1 capsule (20 mg total) by mouth daily. As needed only   No facility-administered medications prior to visit.    Review of Systems  Constitutional:  Negative for chills and fatigue.  HENT:  Positive for congestion, ear pain and sore throat.   Respiratory:  Positive for cough. Negative for shortness of breath.        Objective    BP 135/76 (BP Location: Right Arm, Patient Position: Sitting, Cuff Size: Normal)   Pulse 81   Temp 97.8 F (36.6  C) (Temporal)   Resp 16   Wt 112 lb 4.8 oz (50.9 kg)   SpO2 100%   BMI 21.22 kg/m  BP Readings from Last 3 Encounters:  05/07/22 135/76  05/01/22 126/76  04/14/22 136/67   Wt Readings from Last 3 Encounters:  05/07/22 112 lb 4.8 oz (50.9 kg)  05/01/22 115 lb 9.6 oz (52.4 kg)  04/14/22 114 lb 9.6 oz (52 kg)      Physical Exam Vitals reviewed.  Constitutional:      General: She is not in acute distress.    Appearance: Normal appearance. She is well-developed. She is not diaphoretic.  HENT:     Head: Normocephalic and atraumatic.     Right Ear: Tympanic membrane, ear canal and external ear normal.     Left Ear: Tympanic membrane, ear canal and external ear normal.     Nose: Nose normal.     Mouth/Throat:     Mouth: Mucous membranes are moist.     Pharynx: Oropharynx is clear. No oropharyngeal exudate.  Eyes:     General: No scleral icterus.    Conjunctiva/sclera: Conjunctivae normal.     Pupils: Pupils are equal, round, and reactive to light.  Neck:     Thyroid: No thyromegaly.  Cardiovascular:     Rate and Rhythm: Normal rate and regular rhythm.     Pulses: Normal pulses.     Heart sounds: Normal heart sounds. No murmur heard. Pulmonary:     Effort: Pulmonary effort is normal. No respiratory distress.     Breath  sounds: Normal breath sounds. No wheezing or rales.  Musculoskeletal:     Cervical back: Neck supple.     Right lower leg: No edema.     Left lower leg: No edema.  Lymphadenopathy:     Cervical: No cervical adenopathy.  Skin:    General: Skin is warm and dry.     Findings: No rash.  Neurological:     Mental Status: She is alert.       Results for orders placed or performed in visit on 05/07/22  POC COVID-19  Result Value Ref Range   SARS Coronavirus 2 Ag Negative Negative    Assessment & Plan     1. Upper respiratory tract infection, unspecified type - symptoms and exam c/w viral URI - no evidence of strep pharyngitis, CAP, AOM, bacterial  sinusitis, or other bacterial infection - COVID negative - discussed symptomatic management, natural course, and return precautions  - POC COVID-19   No follow-ups on file.      I, Lavon Paganini, MD, have reviewed all documentation for this visit. The documentation on 05/07/22 for the exam, diagnosis, procedures, and orders are all accurate and complete.   Reiko Vinje, Dionne Bucy, MD, MPH [facility]

## 2022-06-03 DIAGNOSIS — T84090A Other mechanical complication of internal right hip prosthesis, initial encounter: Secondary | ICD-10-CM | POA: Diagnosis not present

## 2022-06-17 ENCOUNTER — Other Ambulatory Visit: Payer: Self-pay | Admitting: Family Medicine

## 2022-06-17 DIAGNOSIS — F419 Anxiety disorder, unspecified: Secondary | ICD-10-CM

## 2022-06-17 NOTE — Telephone Encounter (Signed)
Requested medication (s) are due for refill today - yes  Requested medication (s) are on the active medication list -yes  Future visit scheduled -yes  Last refill: 07/21/21 #30 2RF  Notes to clinic: non delegated Rx  Requested Prescriptions  Pending Prescriptions Disp Refills   ALPRAZolam (XANAX) 0.25 MG tablet [Pharmacy Med Name: ALPRAZOLAM 0.25 MG TABLET] 30 tablet     Sig: TAKE 1 TABLET BY MOUTH AT BEDTIME AS NEEDED FOR ANXIETY.     Not Delegated - Psychiatry: Anxiolytics/Hypnotics 2 Failed - 06/17/2022  3:40 PM      Failed - This refill cannot be delegated      Failed - Urine Drug Screen completed in last 360 days      Passed - Patient is not pregnant      Passed - Valid encounter within last 6 months    Recent Outpatient Visits           1 month ago Upper respiratory tract infection, unspecified type   [facility] [place], Dionne Bucy, MD   1 month ago [place] Mikey Kirschner, PA-C   2 months ago Right hip pain   [place], Dionne Bucy, MD   8 months ago Mass of upper inner quadrant of left breast   [place], Dionne Bucy, MD   11 months ago Encounter for annual physical exam   [place], Dionne Bucy, MD       Future Appointments             In 1 month Bacigalupo, Dionne Bucy, MD [facility], [facility]               Requested Prescriptions  Pending Prescriptions Disp Refills   ALPRAZolam (XANAX) 0.25 MG tablet [Pharmacy Med Name: ALPRAZOLAM 0.25 MG TABLET] 30 tablet     Sig: TAKE 1 TABLET BY MOUTH AT BEDTIME AS NEEDED FOR ANXIETY.     Not Delegated - Psychiatry: Anxiolytics/Hypnotics 2 Failed - 06/17/2022  3:40 PM      Failed - This refill cannot be delegated      Failed - Urine Drug Screen completed in last 360 days      Passed - Patient is not pregnant       Passed - Valid encounter within last 6 months    Recent Outpatient Visits           1 month ago Upper respiratory tract infection, unspecified type   [place], Dionne Bucy, MD   1 month ago [place] Mikey Kirschner, PA-C   2 months ago Right hip pain   [place], Dionne Bucy, MD   8 months ago Mass of upper inner quadrant of left breast   [place], Dionne Bucy, MD   11 months ago Encounter for annual physical exam   [place], Dionne Bucy, MD       Future Appointments             In 1 month Bacigalupo, Dionne Bucy, MD [facility], [facility]

## 2022-06-25 DIAGNOSIS — R7309 Other abnormal glucose: Secondary | ICD-10-CM | POA: Diagnosis not present

## 2022-06-25 DIAGNOSIS — E878 Other disorders of electrolyte and fluid balance, not elsewhere classified: Secondary | ICD-10-CM | POA: Diagnosis not present

## 2022-06-25 DIAGNOSIS — Z01812 Encounter for preprocedural laboratory examination: Secondary | ICD-10-CM | POA: Diagnosis not present

## 2022-06-25 DIAGNOSIS — R718 Other abnormality of red blood cells: Secondary | ICD-10-CM | POA: Diagnosis not present

## 2022-06-26 DIAGNOSIS — T84090A Other mechanical complication of internal right hip prosthesis, initial encounter: Secondary | ICD-10-CM | POA: Diagnosis not present

## 2022-07-01 DIAGNOSIS — Z853 Personal history of malignant neoplasm of breast: Secondary | ICD-10-CM | POA: Diagnosis not present

## 2022-07-01 DIAGNOSIS — Z800 Family history of malignant neoplasm of digestive organs: Secondary | ICD-10-CM | POA: Diagnosis not present

## 2022-07-01 DIAGNOSIS — Z96653 Presence of artificial knee joint, bilateral: Secondary | ICD-10-CM | POA: Diagnosis not present

## 2022-07-01 DIAGNOSIS — Z8679 Personal history of other diseases of the circulatory system: Secondary | ICD-10-CM | POA: Diagnosis not present

## 2022-07-01 DIAGNOSIS — Z888 Allergy status to other drugs, medicaments and biological substances status: Secondary | ICD-10-CM | POA: Diagnosis not present

## 2022-07-01 DIAGNOSIS — K219 Gastro-esophageal reflux disease without esophagitis: Secondary | ICD-10-CM | POA: Diagnosis not present

## 2022-07-01 DIAGNOSIS — Z91010 Allergy to peanuts: Secondary | ICD-10-CM | POA: Diagnosis not present

## 2022-07-01 DIAGNOSIS — Z79899 Other long term (current) drug therapy: Secondary | ICD-10-CM | POA: Diagnosis not present

## 2022-07-01 DIAGNOSIS — Z9889 Other specified postprocedural states: Secondary | ICD-10-CM | POA: Diagnosis not present

## 2022-07-01 DIAGNOSIS — Z91012 Allergy to eggs: Secondary | ICD-10-CM | POA: Diagnosis not present

## 2022-07-01 DIAGNOSIS — M87251 Osteonecrosis due to previous trauma, right femur: Secondary | ICD-10-CM | POA: Diagnosis not present

## 2022-07-01 DIAGNOSIS — T84090A Other mechanical complication of internal right hip prosthesis, initial encounter: Secondary | ICD-10-CM | POA: Diagnosis not present

## 2022-07-01 DIAGNOSIS — Z9049 Acquired absence of other specified parts of digestive tract: Secondary | ICD-10-CM | POA: Diagnosis not present

## 2022-07-01 DIAGNOSIS — M87851 Other osteonecrosis, right femur: Secondary | ICD-10-CM | POA: Diagnosis not present

## 2022-07-01 DIAGNOSIS — R7303 Prediabetes: Secondary | ICD-10-CM | POA: Diagnosis not present

## 2022-07-01 DIAGNOSIS — Z950 Presence of cardiac pacemaker: Secondary | ICD-10-CM | POA: Diagnosis not present

## 2022-07-01 DIAGNOSIS — Z9012 Acquired absence of left breast and nipple: Secondary | ICD-10-CM | POA: Diagnosis not present

## 2022-07-01 DIAGNOSIS — I442 Atrioventricular block, complete: Secondary | ICD-10-CM | POA: Diagnosis not present

## 2022-07-01 DIAGNOSIS — M810 Age-related osteoporosis without current pathological fracture: Secondary | ICD-10-CM | POA: Diagnosis not present

## 2022-07-01 DIAGNOSIS — F419 Anxiety disorder, unspecified: Secondary | ICD-10-CM | POA: Diagnosis not present

## 2022-07-01 DIAGNOSIS — Z9851 Tubal ligation status: Secondary | ICD-10-CM | POA: Diagnosis not present

## 2022-07-01 DIAGNOSIS — Z87891 Personal history of nicotine dependence: Secondary | ICD-10-CM | POA: Diagnosis not present

## 2022-07-08 DIAGNOSIS — M25551 Pain in right hip: Secondary | ICD-10-CM | POA: Diagnosis not present

## 2022-07-08 DIAGNOSIS — M25651 Stiffness of right hip, not elsewhere classified: Secondary | ICD-10-CM | POA: Diagnosis not present

## 2022-07-13 DIAGNOSIS — M25551 Pain in right hip: Secondary | ICD-10-CM | POA: Diagnosis not present

## 2022-07-13 DIAGNOSIS — M25651 Stiffness of right hip, not elsewhere classified: Secondary | ICD-10-CM | POA: Diagnosis not present

## 2022-07-14 DIAGNOSIS — Z96641 Presence of right artificial hip joint: Secondary | ICD-10-CM | POA: Diagnosis not present

## 2022-07-15 DIAGNOSIS — I442 Atrioventricular block, complete: Secondary | ICD-10-CM | POA: Diagnosis not present

## 2022-07-15 DIAGNOSIS — E782 Mixed hyperlipidemia: Secondary | ICD-10-CM | POA: Diagnosis not present

## 2022-07-15 DIAGNOSIS — Z950 Presence of cardiac pacemaker: Secondary | ICD-10-CM | POA: Diagnosis not present

## 2022-07-15 DIAGNOSIS — I471 Supraventricular tachycardia, unspecified: Secondary | ICD-10-CM | POA: Diagnosis not present

## 2022-07-15 DIAGNOSIS — I493 Ventricular premature depolarization: Secondary | ICD-10-CM | POA: Diagnosis not present

## 2022-07-16 DIAGNOSIS — M25551 Pain in right hip: Secondary | ICD-10-CM | POA: Diagnosis not present

## 2022-07-16 DIAGNOSIS — M25651 Stiffness of right hip, not elsewhere classified: Secondary | ICD-10-CM | POA: Diagnosis not present

## 2022-07-20 DIAGNOSIS — M25651 Stiffness of right hip, not elsewhere classified: Secondary | ICD-10-CM | POA: Diagnosis not present

## 2022-07-20 DIAGNOSIS — M25551 Pain in right hip: Secondary | ICD-10-CM | POA: Diagnosis not present

## 2022-07-21 ENCOUNTER — Other Ambulatory Visit: Payer: Self-pay | Admitting: Family Medicine

## 2022-07-22 ENCOUNTER — Other Ambulatory Visit: Payer: Self-pay | Admitting: Family Medicine

## 2022-07-22 ENCOUNTER — Ambulatory Visit (INDEPENDENT_AMBULATORY_CARE_PROVIDER_SITE_OTHER): Payer: PPO

## 2022-07-22 VITALS — Ht 62.0 in | Wt 110.0 lb

## 2022-07-22 DIAGNOSIS — Z0000 Encounter for general adult medical examination without abnormal findings: Secondary | ICD-10-CM | POA: Diagnosis not present

## 2022-07-22 NOTE — Progress Notes (Signed)
I connected with  Natalie Rosales on 07/22/22 by a audio enabled telemedicine application and verified that I am speaking with the correct person using two identifiers.  Patient Location: Home  Provider Location: Office/Clinic  I discussed the limitations of evaluation and management by telemedicine. The patient expressed understanding and agreed to proceed.  Subjective:   Natalie Rosales is a 77 y.o. female who presents for Medicare Annual (Subsequent) preventive examination.  Review of Systems       Objective:    Today's Vitals   07/22/22 1356 07/22/22 1357  Weight: 110 lb (49.9 kg)   Height: 5\' 2"  (1.575 m)   PainSc:  4    Body mass index is 20.12 kg/m.     07/22/2022    2:12 PM 07/21/2021    8:29 AM 07/08/2020    9:11 AM 12/13/2019    7:30 AM 11/15/2019    9:36 AM 05/23/2019   11:20 AM 05/16/2018   10:31 AM  Advanced Directives  Does Patient Have a Medical Advance Directive? No No No No No No No  Would patient like information on creating a medical advance directive?  No - Patient declined No - Patient declined No - Patient declined No - Patient declined No - Patient declined No - Patient declined    Current Medications (verified) Outpatient Encounter Medications as of 07/22/2022  Medication Sig   alendronate (FOSAMAX) 70 MG tablet TAKE 1 TABLET BY MOUTH EVERY 7 (SEVEN) DAYS. TAKE WITH A FULL GLASS OF WATER ON AN EMPTY STOMACH.   ALPRAZolam (XANAX) 0.25 MG tablet TAKE 1 TABLET BY MOUTH AT BEDTIME AS NEEDED FOR ANXIETY.   aspirin 81 MG chewable tablet Chew by mouth daily.   celecoxib (CELEBREX) 200 MG capsule TAKE 1 CAPSULE BY MOUTH EVERY DAY   methocarbamol (ROBAXIN) 500 MG tablet Take 500 mg by mouth every 8 (eight) hours as needed for muscle spasms.   mometasone (NASONEX) 50 MCG/ACT nasal spray Place 2 sprays into the nose daily.   omeprazole (PRILOSEC) 20 MG capsule TAKE 1 CAPSULE (20 MG TOTAL) BY MOUTH DAILY. AS NEEDED ONLY   No facility-administered encounter  medications on file as of 07/22/2022.    Allergies (verified) Peanut-containing drug products and Egg-derived products   History: Past Medical History:  Diagnosis Date   Allergy    Anxiety    Arthritis    back   Back pain    lower back   Breast cancer ([place]) 2005    left breast ca, Lumpectomy, f/u with radiation    Cervical cancer ([place])    Closed fracture of intracapsular section of femur ([place]) 11/17/2011   Complete heart block ([place])    Pacemaker placed 2005   Hyperlipidemia    Personal history of radiation therapy    PONV (postoperative nausea and vomiting)    Presence of permanent cardiac pacemaker 06/05/2013   Adapta DR [id number]   Wears contact lenses    Wears hearing aid    bilateral   Past Surgical History:  Procedure Laterality Date   ABDOMINAL HYSTERECTOMY     APPENDECTOMY  1982   BREAST BIOPSY Left 11/19/2021   [place] bx, 11:30 4 cmfn, ribbon marker, path pending   BREAST LUMPECTOMY Left 2005   f/u radiation    CARDIAC PACEMAKER PLACEMENT  2005   CATARACT EXTRACTION W/PHACO Right 11/15/2019   Procedure: CATARACT EXTRACTION PHACO AND INTRAOCULAR LENS PLACEMENT ([place]) RIGHT;  Surgeon: Leandrew Koyanagi, MD;  Location: [facility];  Service: Ophthalmology;  Laterality: Right;  8.61 1:12.4 11.9%   CATARACT EXTRACTION W/PHACO Left 12/13/2019   Procedure: CATARACT EXTRACTION PHACO AND INTRAOCULAR LENS PLACEMENT ([place]) LEFT TORIC LENS;  Surgeon: Leandrew Koyanagi, MD;  Location: [facility];  Service: Ophthalmology;  Laterality: Left;  9.12 1:13.5 12.4%   CHOLECYSTECTOMY  1982   COLONOSCOPY WITH PROPOFOL N/A 06/10/2015   Procedure: COLONOSCOPY WITH PROPOFOL;  Surgeon: Lucilla Lame, MD;  Location: [facility];  Service: Endoscopy;  Laterality: N/A;   HIP FRACTURE SURGERY Right 2012   Three pins    Family History  Problem Relation Age of Onset   Cancer Mother    Cancer Father        Lymphoma or Pancreatic Cancer   Pancreatic cancer Father     Lymphoma Father    Heart disease Sister    Breast cancer Sister    Bladder Cancer Brother    Lymphoma Brother    Breast cancer Cousin        2 mat cousins   Esophageal cancer Niece    Social History   Socioeconomic History   Marital status: Married    Spouse name: [name]   Number of children: 3   Years of education: College   Highest education level: Associate degree: academic program  Occupational History   Occupation: Hair Dresser    Comment: 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  Tobacco Use   Smoking status: Former    Types: Cigarettes    Quit date: 2005    Years since quitting: 19.2   Smokeless tobacco: Never   Tobacco comments:    quit 2005 (was social smoker only)  Vaping Use   Vaping Use: Never used  Substance and Sexual Activity   Alcohol use: Not Currently   Drug use: No   Sexual activity: Not Currently  Other Topics Concern   Not on file  Social History Narrative   Not on file   Social Determinants of Health   Financial Resource Strain: Low Risk  (07/22/2022)   Overall Financial Resource Strain (CARDIA)    Difficulty of Paying Living Expenses: Not hard at all  Food Insecurity: No Food Insecurity (07/22/2022)   Hunger Vital Sign    Worried About Running Out of Food in the Last Year: Never true    Ran Out of Food in the Last Year: Never true  Transportation Needs: No Transportation Needs (07/22/2022)   PRAPARE - Hydrologist (Medical): No    Lack of Transportation (Non-Medical): No  Physical Activity: Inactive (07/22/2022)   Exercise Vital Sign    Days of Exercise per Week: 0 days    Minutes of Exercise per Session: 0 min  Stress: No Stress Concern Present (07/22/2022)   [place]    Feeling of Stress : Not at all  Social Connections: [place] (07/22/2022)   Social Connection and Isolation Panel [NHANES]    Frequency of Communication with Friends and Family: More than  three times a week    Frequency of Social Gatherings with Friends and Family: More than three times a week    Attends Religious Services: More than 4 times per year    Active Member of [employer] or Organizations: Yes    Attends Archivist Meetings: Never    Marital Status: Married    Tobacco Counseling Counseling given: Not Answered Tobacco comments: quit 2005 (was social smoker only)   Clinical Intake:  Pre-visit preparation completed: Yes  Pain : 0-10 Pain Score: 4  Pain Type: Acute pain, Other (Comment) (total hip replacement 3 wekks ago) Pain Location: Hip Pain Orientation: Right Pain Onset: 1 to 4 weeks ago Pain Relieving Factors: IBU and Tylenol, muscle relaxer  Pain Relieving Factors: IBU and Tylenol, muscle relaxer  BMI - recorded: 20.12 Nutritional Status: BMI of 19-24  Normal Nutritional Risks: None Diabetes: No     Diabetic?no  Interpreter Needed?: No Comments: lives with husband Information entered by :: B.Alixandra Alfieri,LPN   Activities of Daily Living    07/22/2022    2:16 PM 05/01/2022    2:54 PM  In your present state of health, do you have any difficulty performing the following activities:  Hearing? 0 1  Vision? 0 0  Difficulty concentrating or making decisions? 0 0  Walking or climbing stairs? 1 0  Dressing or bathing? 0 0  Doing errands, shopping? 0 0  Preparing Food and eating ? N   Using the Toilet? N   In the past six months, have you accidently leaked urine? N   Do you have problems with loss of bowel control? N   Managing your Medications? N   Managing your Finances? N   Housekeeping or managing your Housekeeping? N     Patient Care Team: Virginia Crews, MD as PCP - General (Family Medicine) Corey Skains, MD as Consulting Physician (Cardiology) Leandrew Koyanagi, MD as Referring Physician (Ophthalmology) Ree Edman, MD (Dermatology)  Indicate any recent Medical Services you may have received from other  than [facility] providers in the past year (date may be approximate).     Assessment:   This is a routine wellness examination for [place].  Hearing/Vision screen Hearing Screening - Comments:: Adequate with hearing aides Vision Screening - Comments:: Adequate vision after cataract surgery two years ago Dr Janine [facility]  Dietary issues and exercise activities discussed: Current Exercise Habits: The patient does not participate in regular exercise at present, Exercise limited by: orthopedic condition(s) (hip replacement 3weeks ago)   Goals Addressed             This Visit's Progress    DIET - EAT MORE FRUITS AND VEGETABLES   On track    DIET - INCREASE WATER INTAKE   On track    Recommend to drink at least 6-8 8oz glasses of water per day.       Depression Screen    07/22/2022    2:08 PM 05/01/2022    2:54 PM 10/13/2021   10:20 AM 07/21/2021    8:27 AM 07/08/2020    9:07 AM 05/23/2019   11:21 AM 05/23/2019   11:14 AM  PHQ 2/9 Scores  PHQ - 2 Score 0 0 0 0 0 0 0  PHQ- 9 Score  0 3        Fall Risk    07/22/2022    2:03 PM 05/01/2022    2:54 PM 10/13/2021   10:20 AM 07/21/2021    8:30 AM 07/08/2020    9:11 AM  Fall Risk   Falls in the past year? 0 0 0 0 0  Number falls in past yr: 0 0 0 0 0  Injury with Fall? 0 0 0 0 0  Risk for fall due to : No Fall Risks No Fall Risks No Fall Risks No Fall Risks   Follow up Education provided;Falls prevention discussed Falls evaluation completed Falls evaluation completed Falls evaluation completed Falls prevention discussed    FALL RISK PREVENTION PERTAINING TO  THE HOME:  Any stairs in or around the home? Yes  If so, are there any without handrails? Yes  Home free of loose throw rugs in walkways, pet beds, electrical cords, etc? Yes  Adequate lighting in your home to reduce risk of falls? Yes   ASSISTIVE DEVICES UTILIZED TO PREVENT FALLS:  Life alert? No  Use of a cane, walker or w/c? Yes walker Grab bars in the bathroom? No   Shower chair or bench in shower? Yes  Elevated toilet seat or a handicapped toilet? No    Cognitive Function:    04/14/2022    9:02 AM  MMSE - Mini Mental State Exam  Orientation to time 5  Orientation to Place 5  Registration 3  Attention/ Calculation 5  Recall 3  Language- name 2 objects 2  Language- repeat 1  Language- follow 3 step command 3  Language- read & follow direction 1  Write a sentence 1  Copy design 1  Total score 30        07/22/2022    2:18 PM  6CIT Screen  What Year? 0 points  What month? 0 points  What time? 0 points  Count back from 20 0 points  Months in reverse 0 points  Repeat phrase 0 points  Total Score 0 points    Immunizations Immunization History  Administered Date(s) Administered   PFIZER Comirnaty(Gray Top)Covid-19 Tri-Sucrose Vaccine 05/26/2019, 06/16/2019   PFIZER(Purple Top)SARS-COV-2 Vaccination 02/19/2020   Pneumococcal Conjugate-13 11/26/2014   Pneumococcal Polysaccharide-23 01/13/2016   Tdap 03/03/2017    TDAP status: Up to date  Flu Vaccine status: Declined, Education has been provided regarding the importance of this vaccine but patient still declined. Advised may receive this vaccine at local pharmacy or Health Dept. Aware to provide a copy of the vaccination record if obtained from local pharmacy or Health Dept. Verbalized acceptance and understanding.  Pneumococcal vaccine status: Up to date  Covid-19 vaccine status: Completed vaccines  Qualifies for Shingles Vaccine? Yes   Zostavax completed No   Shingrix Completed?: No.    Education has been provided regarding the importance of this vaccine. Patient has been advised to call insurance company to determine out of pocket expense if they have not yet received this vaccine. Advised may also receive vaccine at local pharmacy or Health Dept. Verbalized acceptance and understanding.  Screening Tests Health Maintenance  Topic Date Due   Zoster Vaccines- Shingrix (1 of 2)  Never done   COVID-19 Vaccine (4 - 2023-24 season) 12/26/2021   INFLUENZA VACCINE  07/26/2022 (Originally 11/25/2021)   MAMMOGRAM  09/17/2022   Medicare Annual Wellness (AWV)  07/22/2023   DEXA SCAN  09/17/2023   DTaP/Tdap/Td (2 - Td or Tdap) 03/04/2027   Pneumonia Vaccine 78+ Years old  Completed   Hepatitis C Screening  Completed   HPV VACCINES  Aged Out   COLONOSCOPY (Pts 45-35yrs Insurance coverage will need to be confirmed)  Discontinued    Health Maintenance  Health Maintenance Due  Topic Date Due   Zoster Vaccines- Shingrix (1 of 2) Never done   COVID-19 Vaccine (4 - 2023-24 season) 12/26/2021    Colorectal cancer screening: No longer required.   Mammogram status: No longer required due to age.  Bone Density: yes; Osteoporosis 3 years  Lung Cancer Screening: (Low Dose CT Chest recommended if Age 26-80 years, 30 pack-year currently smoking OR have quit w/in 15years.) does not qualify.   Lung Cancer Screening Referral: no  Additional Screening:  Hepatitis C  Screening: does not qualify; Completed yes  Vision Screening: Recommended annual ophthalmology exams for early detection of glaucoma and other disorders of the eye. Is the patient up to date with their annual eye exam?  Yes  Who is the provider or what is the name of the office in which the patient attends annual eye exams? [facility] If pt is not established with a provider, would they like to be referred to a provider to establish care? No .   Dental Screening: Recommended annual dental exams for proper oral hygiene  Community Resource Referral / Chronic Care Management: CRR required this visit?  No   CCM required this visit?  No      Plan:     I have personally reviewed and noted the following in the patient's chart:   Medical and social history Use of alcohol, tobacco or illicit drugs  Current medications and supplements including opioid prescriptions. Patient is not currently taking opioid  prescriptions. Functional ability and status Nutritional status Physical activity Advanced directives List of other physicians Hospitalizations, surgeries, and ER visits in previous 12 months Vitals Screenings to include cognitive, depression, and falls Referrals and appointments  In addition, I have reviewed and discussed with patient certain preventive protocols, quality metrics, and best practice recommendations. A written personalized care plan for preventive services as well as general preventive health recommendations were provided to patient.     Roger Shelter, LPN   [id number]   Nurse Notes: pt is doing alright as she is recuperating from rt hip replacement. Pt has appt tomorrow with PCP.

## 2022-07-22 NOTE — Patient Instructions (Addendum)
Ms. Natalie Rosales , Thank you for taking time to come for your Medicare Wellness Visit. I appreciate your ongoing commitment to your health goals. Please review the following plan we discussed and let me know if I can assist you in the future.   These are the goals we discussed:  Goals      DIET - EAT MORE FRUITS AND VEGETABLES     DIET - INCREASE WATER INTAKE     Recommend to drink at least 6-8 8oz glasses of water per day.        This is a list of the screening recommended for you and due dates:  Health Maintenance  Topic Date Due   Zoster (Shingles) Vaccine (1 of 2) Never done   COVID-19 Vaccine (4 - 2023-24 season) 12/26/2021   Flu Shot  07/26/2022*   Mammogram  09/17/2022   Medicare Annual Wellness Visit  07/22/2023   DEXA scan (bone density measurement)  09/17/2023   DTaP/Tdap/Td vaccine (2 - Td or Tdap) 03/04/2027   Pneumonia Vaccine  Completed   Hepatitis C Screening: USPSTF Recommendation to screen - Ages 16-79 yo.  Completed   HPV Vaccine  Aged Out   Colon Cancer Screening  Discontinued  *Topic was postponed. The date shown is not the original due date.    Advanced directives: no  Conditions/risks identified: low falls risk  Next appointment: Follow up in one year for your annual wellness visit 07/27/2023 @ 2pm telephone   Preventive Care 65 Years and Older, Female Preventive care refers to lifestyle choices and visits with your health care provider that can promote health and wellness. What does preventive care include? A yearly physical exam. This is also called an annual well check. Dental exams once or twice a year. Routine eye exams. Ask your health care provider how often you should have your eyes checked. Personal lifestyle choices, including: Daily care of your teeth and gums. Regular physical activity. Eating a healthy diet. Avoiding tobacco and drug use. Limiting alcohol use. Practicing safe sex. Taking low-dose aspirin every day. Taking vitamin and  mineral supplements as recommended by your health care provider. What happens during an annual well check? The services and screenings done by your health care provider during your annual well check will depend on your age, overall health, lifestyle risk factors, and family history of disease. Counseling  Your health care provider may ask you questions about your: Alcohol use. Tobacco use. Drug use. Emotional well-being. Home and relationship well-being. Sexual activity. Eating habits. History of falls. Memory and ability to understand (cognition). Work and work Statistician. Reproductive health. Screening  You may have the following tests or measurements: Height, weight, and BMI. Blood pressure. Lipid and cholesterol levels. These may be checked every 5 years, or more frequently if you are over 42 years old. Skin check. Lung cancer screening. You may have this screening every year starting at age 53 if you have a 30-pack-year history of smoking and currently smoke or have quit within the past 15 years. Fecal occult blood test (FOBT) of the stool. You may have this test every year starting at age 99. Flexible sigmoidoscopy or colonoscopy. You may have a sigmoidoscopy every 5 years or a colonoscopy every 10 years starting at age 65. Hepatitis C blood test. Hepatitis B blood test. Sexually transmitted disease (STD) testing. Diabetes screening. This is done by checking your blood sugar (glucose) after you have not eaten for a while (fasting). You may have this done every 1-3 years.  Bone density scan. This is done to screen for osteoporosis. You may have this done starting at age 76. Mammogram. This may be done every 1-2 years. Talk to your health care provider about how often you should have regular mammograms. Talk with your health care provider about your test results, treatment options, and if necessary, the need for more tests. Vaccines  Your health care provider may recommend  certain vaccines, such as: Influenza vaccine. This is recommended every year. Tetanus, diphtheria, and acellular pertussis (Tdap, Td) vaccine. You may need a Td booster every 10 years. Zoster vaccine. You may need this after age 92. Pneumococcal 13-valent conjugate (PCV13) vaccine. One dose is recommended after age 36. Pneumococcal polysaccharide (PPSV23) vaccine. One dose is recommended after age 23. Talk to your health care provider about which screenings and vaccines you need and how often you need them. This information is not intended to replace advice given to you by your health care provider. Make sure you discuss any questions you have with your health care provider. Document Released: 05/10/2015 Document Revised: 01/01/2016 Document Reviewed: 02/12/2015 Elsevier Interactive Patient Education  2017 [employer] Prevention in the Home Falls can cause injuries. They can happen to people of all ages. There are many things you can do to make your home safe and to help prevent falls. What can I do on the outside of my home? Regularly fix the edges of walkways and driveways and fix any cracks. Remove anything that might make you trip as you walk through a door, such as a raised step or threshold. Trim any bushes or trees on the path to your home. Use bright outdoor lighting. Clear any walking paths of anything that might make someone trip, such as rocks or tools. Regularly check to see if handrails are loose or broken. Make sure that both sides of any steps have handrails. Any raised decks and porches should have guardrails on the edges. Have any leaves, snow, or ice cleared regularly. Use sand or salt on walking paths during winter. Clean up any spills in your garage right away. This includes oil or grease spills. What can I do in the bathroom? Use night lights. Install grab bars by the toilet and in the tub and shower. Do not use towel bars as grab bars. Use non-skid mats or  decals in the tub or shower. If you need to sit down in the shower, use a plastic, non-slip stool. Keep the floor dry. Clean up any water that spills on the floor as soon as it happens. Remove soap buildup in the tub or shower regularly. Attach bath mats securely with double-sided non-slip rug tape. Do not have throw rugs and other things on the floor that can make you trip. What can I do in the bedroom? Use night lights. Make sure that you have a light by your bed that is easy to reach. Do not use any sheets or blankets that are too big for your bed. They should not hang down onto the floor. Have a firm chair that has side arms. You can use this for support while you get dressed. Do not have throw rugs and other things on the floor that can make you trip. What can I do in the kitchen? Clean up any spills right away. Avoid walking on wet floors. Keep items that you use a lot in easy-to-reach places. If you need to reach something above you, use a strong step stool that has a grab bar.  Keep electrical cords out of the way. Do not use floor polish or wax that makes floors slippery. If you must use wax, use non-skid floor wax. Do not have throw rugs and other things on the floor that can make you trip. What can I do with my stairs? Do not leave any items on the stairs. Make sure that there are handrails on both sides of the stairs and use them. Fix handrails that are broken or loose. Make sure that handrails are as long as the stairways. Check any carpeting to make sure that it is firmly attached to the stairs. Fix any carpet that is loose or worn. Avoid having throw rugs at the top or bottom of the stairs. If you do have throw rugs, attach them to the floor with carpet tape. Make sure that you have a light switch at the top of the stairs and the bottom of the stairs. If you do not have them, ask someone to add them for you. What else can I do to help prevent falls? Wear shoes that: Do not  have high heels. Have rubber bottoms. Are comfortable and fit you well. Are closed at the toe. Do not wear sandals. If you use a stepladder: Make sure that it is fully opened. Do not climb a closed stepladder. Make sure that both sides of the stepladder are locked into place. Ask someone to hold it for you, if possible. Clearly mark and make sure that you can see: Any grab bars or handrails. First and last steps. Where the edge of each step is. Use tools that help you move around (mobility aids) if they are needed. These include: Canes. Walkers. Scooters. Crutches. Turn on the lights when you go into a dark area. Replace any light bulbs as soon as they burn out. Set up your furniture so you have a clear path. Avoid moving your furniture around. If any of your floors are uneven, fix them. If there are any pets around you, be aware of where they are. Review your medicines with your doctor. Some medicines can make you feel dizzy. This can increase your chance of falling. Ask your doctor what other things that you can do to help prevent falls. This information is not intended to replace advice given to you by your health care provider. Make sure you discuss any questions you have with your health care provider. Document Released: 02/07/2009 Document Revised: 09/19/2015 Document Reviewed: 05/18/2014 Elsevier Interactive Patient Education  2017 [employer].

## 2022-07-23 ENCOUNTER — Ambulatory Visit (INDEPENDENT_AMBULATORY_CARE_PROVIDER_SITE_OTHER): Payer: PPO | Admitting: Family Medicine

## 2022-07-23 ENCOUNTER — Encounter: Payer: Self-pay | Admitting: Family Medicine

## 2022-07-23 VITALS — BP 124/66 | HR 70 | Temp 97.8°F | Resp 20 | Ht 61.5 in | Wt 113.9 lb

## 2022-07-23 DIAGNOSIS — R7303 Prediabetes: Secondary | ICD-10-CM | POA: Diagnosis not present

## 2022-07-23 DIAGNOSIS — E0590 Thyrotoxicosis, unspecified without thyrotoxic crisis or storm: Secondary | ICD-10-CM | POA: Diagnosis not present

## 2022-07-23 DIAGNOSIS — Z1231 Encounter for screening mammogram for malignant neoplasm of breast: Secondary | ICD-10-CM | POA: Diagnosis not present

## 2022-07-23 DIAGNOSIS — E559 Vitamin D deficiency, unspecified: Secondary | ICD-10-CM

## 2022-07-23 DIAGNOSIS — M810 Age-related osteoporosis without current pathological fracture: Secondary | ICD-10-CM | POA: Diagnosis not present

## 2022-07-23 DIAGNOSIS — Z0000 Encounter for general adult medical examination without abnormal findings: Secondary | ICD-10-CM | POA: Diagnosis not present

## 2022-07-23 DIAGNOSIS — M25651 Stiffness of right hip, not elsewhere classified: Secondary | ICD-10-CM | POA: Diagnosis not present

## 2022-07-23 DIAGNOSIS — M25551 Pain in right hip: Secondary | ICD-10-CM | POA: Diagnosis not present

## 2022-07-23 NOTE — Assessment & Plan Note (Signed)
Recommend low carb diet °Recheck A1c  °

## 2022-07-23 NOTE — Assessment & Plan Note (Signed)
Continue supplement Recheck level 

## 2022-07-23 NOTE — Assessment & Plan Note (Signed)
Doing well on Fosamax We will continue Next DEXA in 1 year Also continue Ca/Vit D

## 2022-07-23 NOTE — Assessment & Plan Note (Signed)
Asymptomatic ?Recheck TSH and free T4 ?

## 2022-07-23 NOTE — Progress Notes (Signed)
I,Sulibeya S Dimas,acting as a Education administrator for Lavon Paganini, MD.,have documented all relevant documentation on the behalf of Lavon Paganini, MD,as directed by  Lavon Paganini, MD while in the presence of Lavon Paganini, MD.    Complete physical exam   Patient: Natalie Rosales   DOB: 1945/08/30   77 y.o. Female  MRN: [id number] Visit Date: 07/23/2022  Today's healthcare provider: Lavon Paganini, MD   Chief Complaint  Patient presents with   Annual Exam   Subjective    Natalie Rosales is a 77 y.o. female who presents today for a complete physical exam.  She reports consuming a general diet. Home exercise routine includes stretching, treadmill, and walking. She generally feels well. She reports sleeping well. She does not have additional problems to discuss today.  HPI  07/22/22 AVW  Past Medical History:  Diagnosis Date   Allergy    Anxiety    Arthritis    back   Back pain    lower back   Breast cancer ([place]) 2005    left breast ca, Lumpectomy, f/u with radiation    Cervical cancer ([place])    Closed fracture of intracapsular section of femur ([place]) 11/17/2011   Complete heart block ([place])    Pacemaker placed 2005   Hyperlipidemia    Personal history of radiation therapy    PONV (postoperative nausea and vomiting)    Presence of permanent cardiac pacemaker 06/05/2013   Adapta DR [id number]   Wears contact lenses    Wears hearing aid    bilateral   Past Surgical History:  Procedure Laterality Date   ABDOMINAL HYSTERECTOMY     APPENDECTOMY  1982   BREAST BIOPSY Left 11/19/2021   [place] bx, 11:30 4 cmfn, ribbon marker, path pending   BREAST LUMPECTOMY Left 2005   f/u radiation    CARDIAC PACEMAKER PLACEMENT  2005   CATARACT EXTRACTION W/PHACO Right 11/15/2019   Procedure: CATARACT EXTRACTION PHACO AND INTRAOCULAR LENS PLACEMENT ([place]) RIGHT;  Surgeon: Leandrew Koyanagi, MD;  Location: [facility];  Service: Ophthalmology;  Laterality: Right;   8.61 1:12.4 11.9%   CATARACT EXTRACTION W/PHACO Left 12/13/2019   Procedure: CATARACT EXTRACTION PHACO AND INTRAOCULAR LENS PLACEMENT ([place]) LEFT TORIC LENS;  Surgeon: Leandrew Koyanagi, MD;  Location: [facility];  Service: Ophthalmology;  Laterality: Left;  9.12 1:13.5 12.4%   CHOLECYSTECTOMY  1982   COLONOSCOPY WITH PROPOFOL N/A 06/10/2015   Procedure: COLONOSCOPY WITH PROPOFOL;  Surgeon: Lucilla Lame, MD;  Location: [facility];  Service: Endoscopy;  Laterality: N/A;   HIP FRACTURE SURGERY Right 2012   Three pins    Social History   Socioeconomic History   Marital status: Married    Spouse name: [name]   Number of children: 3   Years of education: College   Highest education level: Associate degree: academic program  Occupational History   Occupation: Hair Dresser    Comment: 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  Tobacco Use   Smoking status: Former    Types: Cigarettes    Quit date: 2005    Years since quitting: 19.2   Smokeless tobacco: Never   Tobacco comments:    quit 2005 (was social smoker only)  Vaping Use   Vaping Use: Never used  Substance and Sexual Activity   Alcohol use: Not Currently   Drug use: No   Sexual activity: Not Currently  Other Topics Concern   Not on file  Social History Narrative   Not on file   Social  Determinants of Health   Financial Resource Strain: Low Risk  (07/22/2022)   Overall Financial Resource Strain (CARDIA)    Difficulty of Paying Living Expenses: Not hard at all  Food Insecurity: No Food Insecurity (07/22/2022)   Hunger Vital Sign    Worried About Running Out of Food in the Last Year: Never true    Ran Out of Food in the Last Year: Never true  Transportation Needs: No Transportation Needs (07/22/2022)   PRAPARE - Hydrologist (Medical): No    Lack of Transportation (Non-Medical): No  Physical Activity: Inactive (07/22/2022)   Exercise Vital Sign    Days of Exercise per Week: 0 days    Minutes of  Exercise per Session: 0 min  Stress: No Stress Concern Present (07/22/2022)   [place]    Feeling of Stress : Not at all  Social Connections: [place] (07/22/2022)   Social Connection and Isolation Panel [NHANES]    Frequency of Communication with Friends and Family: More than three times a week    Frequency of Social Gatherings with Friends and Family: More than three times a week    Attends Religious Services: More than 4 times per year    Active Member of [employer] or Organizations: Yes    Attends Archivist Meetings: Never    Marital Status: Married  Human resources officer Violence: Not At Risk (07/22/2022)   Humiliation, Afraid, Rape, and Kick questionnaire    Fear of Current or Ex-Partner: No    Emotionally Abused: No    Physically Abused: No    Sexually Abused: No   Family Status  Relation Name Status   Mother  Deceased   Father  Deceased   Sister  Deceased at age 66       Heart Disease   Brother  Building control surveyor  (Not Specified)   Niece  Alive   Family History  Problem Relation Age of Onset   Cancer Mother    Cancer Father        Lymphoma or Pancreatic Cancer   Pancreatic cancer Father    Lymphoma Father    Heart disease Sister    Breast cancer Sister    Bladder Cancer Brother    Lymphoma Brother    Breast cancer Cousin        2 mat cousins   Esophageal cancer Niece    Allergies  Allergen Reactions   Oxycodone Hives   Peanut-Containing Drug Products Diarrhea    All nuts - diarrhea and fever blisters   Egg-Derived Products Rash    Diarrhea, fever blisters    Patient Care Team: Virginia Crews, MD as PCP - General (Family Medicine) Corey Skains, MD as Consulting Physician (Cardiology) Leandrew Koyanagi, MD as Referring Physician (Ophthalmology) Ree Edman, MD (Dermatology)   Medications: Outpatient Medications Prior to Visit  Medication Sig   alendronate  (FOSAMAX) 70 MG tablet TAKE 1 TABLET BY MOUTH EVERY 7 (SEVEN) DAYS. TAKE WITH A FULL GLASS OF WATER ON AN EMPTY STOMACH.   ALPRAZolam (XANAX) 0.25 MG tablet TAKE 1 TABLET BY MOUTH AT BEDTIME AS NEEDED FOR ANXIETY.   aspirin 81 MG chewable tablet Chew by mouth daily.   celecoxib (CELEBREX) 200 MG capsule TAKE 1 CAPSULE BY MOUTH EVERY DAY   fluorouracil (EFUDEX) 5 % cream APPLY DAILY AS DIRECTED IN OFFICE TOPICALLY TO CHEST AND BACK OF HANDS   methocarbamol (ROBAXIN) 500 MG  tablet Take 500 mg by mouth every 8 (eight) hours as needed for muscle spasms.   mometasone (NASONEX) 50 MCG/ACT nasal spray Place 2 sprays into the nose daily.   omeprazole (PRILOSEC) 20 MG capsule TAKE 1 CAPSULE (20 MG TOTAL) BY MOUTH DAILY. AS NEEDED ONLY   No facility-administered medications prior to visit.    Review of Systems  Musculoskeletal:  Positive for arthralgias.  Allergic/Immunologic: Positive for environmental allergies.  All other systems reviewed and are negative.   Last CBC Lab Results  Component Value Date   WBC 7.0 07/16/2020   HGB 12.9 07/16/2020   HCT 38.6 07/16/2020   MCV 90 07/16/2020   MCH 30.0 07/16/2020   RDW 13.1 07/16/2020   PLT 287 [id number]   Last metabolic panel Lab Results  Component Value Date   GLUCOSE 96 07/21/2021   NA 142 07/21/2021   K 4.5 07/21/2021   CL 104 07/21/2021   CO2 21 07/21/2021   BUN 14 07/21/2021   CREATININE 0.83 07/21/2021   EGFR 73 07/21/2021   CALCIUM 9.4 07/21/2021   PROT 6.7 07/21/2021   ALBUMIN 4.6 07/21/2021   LABGLOB 2.1 07/21/2021   AGRATIO 2.2 07/21/2021   BILITOT 0.5 07/21/2021   ALKPHOS 56 07/21/2021   AST 24 07/21/2021   ALT 10 07/21/2021   ANIONGAP 6 (L) 05/29/2014   Last lipids Lab Results  Component Value Date   CHOL 218 (H) 07/21/2021   HDL 64 07/21/2021   LDLCALC 140 (H) 07/21/2021   TRIG 77 07/21/2021   CHOLHDL 2.6 07/16/2020   Last hemoglobin A1c Lab Results  Component Value Date   HGBA1C 5.7 (H) 07/21/2021    Last thyroid functions Lab Results  Component Value Date   TSH 1.560 07/21/2021   Last vitamin D Lab Results  Component Value Date   VD25OH 17.6 (L) 07/21/2021   Last vitamin B12 and Folate No results found for: "VITAMINB12", "FOLATE"    Objective    BP 124/66 (BP Location: Right Arm, Patient Position: Sitting, Cuff Size: Normal)   Pulse 70   Temp 97.8 F (36.6 C) (Temporal)   Resp 20   Ht 5' 1.5" (1.562 m)   Wt 113 lb 14.4 oz (51.7 kg)   SpO2 97%   BMI 21.17 kg/m  BP Readings from Last 3 Encounters:  07/23/22 124/66  05/07/22 135/76  05/01/22 126/76   Wt Readings from Last 3 Encounters:  07/23/22 113 lb 14.4 oz (51.7 kg)  07/22/22 110 lb (49.9 kg)  05/07/22 112 lb 4.8 oz (50.9 kg)      Physical Exam Vitals reviewed.  Constitutional:      General: She is not in acute distress.    Appearance: Normal appearance. She is well-developed. She is not diaphoretic.  HENT:     Head: Normocephalic and atraumatic.     Right Ear: Tympanic membrane, ear canal and external ear normal.     Left Ear: Tympanic membrane, ear canal and external ear normal.     Nose: Nose normal.     Mouth/Throat:     Mouth: Mucous membranes are moist.     Pharynx: Oropharynx is clear. No oropharyngeal exudate.  Eyes:     General: No scleral icterus.    Conjunctiva/sclera: Conjunctivae normal.     Pupils: Pupils are equal, round, and reactive to light.  Neck:     Thyroid: No thyromegaly.  Cardiovascular:     Rate and Rhythm: Normal rate and regular rhythm.     Pulses:  Normal pulses.     Heart sounds: Normal heart sounds. No murmur heard. Pulmonary:     Effort: Pulmonary effort is normal. No respiratory distress.     Breath sounds: Normal breath sounds. No wheezing or rales.  Abdominal:     General: There is no distension.     Palpations: Abdomen is soft.     Tenderness: There is no abdominal tenderness.  Musculoskeletal:     Cervical back: Neck supple.     Right lower leg: No  edema.     Left lower leg: No edema.  Lymphadenopathy:     Cervical: No cervical adenopathy.  Skin:    General: Skin is warm and dry.  Neurological:     Mental Status: She is alert and oriented to person, place, and time. Mental status is at baseline.     Gait: Gait abnormal (antalgic, walks with walker, s/p R hip replacement).  Psychiatric:        Mood and Affect: Mood normal.        Behavior: Behavior normal.        Thought Content: Thought content normal.       Last depression screening scores    07/22/2022    2:08 PM 05/01/2022    2:54 PM 10/13/2021   10:20 AM  PHQ 2/9 Scores  PHQ - 2 Score 0 0 0  PHQ- 9 Score  0 3   Last fall risk screening    07/22/2022    2:03 PM  [place] in the past year? 0  Number falls in past yr: 0  Injury with Fall? 0  Risk for fall due to : No Fall Risks  Follow up Education provided;Falls prevention discussed   Last Audit-C alcohol use screening    07/22/2022    2:08 PM  Alcohol Use Disorder Test (AUDIT)  1. How often do you have a drink containing alcohol? 0  2. How many drinks containing alcohol do you have on a typical day when you are drinking? 0  3. How often do you have six or more drinks on one occasion? 0  AUDIT-C Score 0   A score of 3 or more in women, and 4 or more in men indicates increased risk for alcohol abuse, EXCEPT if all of the points are from question 1   No results found for any visits on 07/23/22.  Assessment & Plan    Routine Health Maintenance and Physical Exam  Exercise Activities and Dietary recommendations  Goals      DIET - EAT MORE FRUITS AND VEGETABLES     DIET - INCREASE WATER INTAKE     Recommend to drink at least 6-8 8oz glasses of water per day.        Immunization History  Administered Date(s) Administered   PFIZER Comirnaty(Gray Top)Covid-19 Tri-Sucrose Vaccine 05/26/2019, 06/16/2019   PFIZER(Purple Top)SARS-COV-2 Vaccination 02/19/2020   Pneumococcal Conjugate-13 11/26/2014    Pneumococcal Polysaccharide-23 01/13/2016   Tdap 03/03/2017    Health Maintenance  Topic Date Due   Zoster Vaccines- Shingrix (1 of 2) Never done   COVID-19 Vaccine (4 - 2023-24 season) 12/26/2021   INFLUENZA VACCINE  07/26/2022 (Originally 11/25/2021)   MAMMOGRAM  09/17/2022   Medicare Annual Wellness (AWV)  07/22/2023   DEXA SCAN  09/17/2023   DTaP/Tdap/Td (2 - Td or Tdap) 03/04/2027   Pneumonia Vaccine 91+ Years old  Completed   Hepatitis C Screening  Completed   HPV VACCINES  Aged Out  COLONOSCOPY (Pts 45-44yrs Insurance coverage will need to be confirmed)  Discontinued    Discussed health benefits of physical activity, and encouraged her to engage in regular exercise appropriate for her age and condition.  Problem List Items Addressed This Visit       Endocrine   Subclinical hyperthyroidism    Asymptomatic Recheck TSH and free T4      Relevant Orders   TSH + free T4   Lipid panel   Comprehensive metabolic panel     Musculoskeletal and Integument   Osteoporosis    Doing well on Fosamax We will continue Next DEXA in 1 year Also continue Ca/Vit D      Relevant Orders   VITAMIN D 25 Hydroxy (Vit-D Deficiency, Fractures)     Other   Prediabetes    Recommend low carb diet Recheck A1c       Relevant Orders   Hemoglobin A1c   Lipid panel   Comprehensive metabolic panel   Avitaminosis D    Continue supplement Recheck level       Relevant Orders   VITAMIN D 25 Hydroxy (Vit-D Deficiency, Fractures)   Other Visit Diagnoses     Encounter for annual physical exam    -  Primary   Relevant Orders   TSH + free T4   Hemoglobin A1c   VITAMIN D 25 Hydroxy (Vit-D Deficiency, Fractures)   Lipid panel   Comprehensive metabolic panel   Breast cancer screening by mammogram       Relevant Orders   MM 3D SCREENING MAMMOGRAM BILATERAL BREAST        Return in about 1 year (around 07/23/2023) for CPE.     I, Lavon Paganini, MD, have reviewed all  documentation for this visit. The documentation on 07/23/22 for the exam, diagnosis, procedures, and orders are all accurate and complete.   Sheala Dosh, Dionne Bucy, MD, MPH [facility]

## 2022-07-23 NOTE — Patient Instructions (Signed)
   The CDC recommends two doses of Shingrix (the shingles vaccine) separated by 2 to 6 months for adults age 77 years and older. I recommend checking with your insurance plan regarding coverage for this vaccine.   

## 2022-07-24 LAB — COMPREHENSIVE METABOLIC PANEL
ALT: 11 IU/L (ref 0–32)
AST: 18 IU/L (ref 0–40)
Albumin/Globulin Ratio: 1.8 (ref 1.2–2.2)
Albumin: 4 g/dL (ref 3.8–4.8)
Alkaline Phosphatase: 79 IU/L (ref 44–121)
BUN/Creatinine Ratio: 14 (ref 12–28)
BUN: 14 mg/dL (ref 8–27)
Bilirubin Total: 0.4 mg/dL (ref 0.0–1.2)
CO2: 22 mmol/L (ref 20–29)
Calcium: 9.5 mg/dL (ref 8.7–10.3)
Chloride: 104 mmol/L (ref 96–106)
Creatinine, Ser: 0.99 mg/dL (ref 0.57–1.00)
Globulin, Total: 2.2 g/dL (ref 1.5–4.5)
Glucose: 92 mg/dL (ref 70–99)
Potassium: 4.9 mmol/L (ref 3.5–5.2)
Sodium: 141 mmol/L (ref 134–144)
Total Protein: 6.2 g/dL (ref 6.0–8.5)
eGFR: 59 mL/min/{1.73_m2} — ABNORMAL LOW (ref >59)

## 2022-07-24 LAB — LIPID PANEL
Chol/HDL Ratio: 3 ratio (ref 0.0–4.4)
Cholesterol, Total: 160 mg/dL (ref 100–199)
HDL: 54 mg/dL (ref >39)
LDL Chol Calc (NIH): 88 mg/dL (ref 0–99)
Triglycerides: 101 mg/dL (ref 0–149)
VLDL Cholesterol Cal: 18 mg/dL (ref 5–40)

## 2022-07-24 LAB — TSH+FREE T4
Free T4: 1.18 ng/dL (ref 0.82–1.77)
TSH: 1.17 u[IU]/mL (ref 0.450–4.500)

## 2022-07-24 LAB — HEMOGLOBIN A1C
Est. average glucose Bld gHb Est-mCnc: 111 mg/dL
Hgb A1c MFr Bld: 5.5 % (ref 4.8–5.6)

## 2022-07-24 LAB — VITAMIN D 25 HYDROXY (VIT D DEFICIENCY, FRACTURES): Vit D, 25-Hydroxy: 20.6 ng/mL — ABNORMAL LOW (ref 30.0–100.0)

## 2022-07-30 DIAGNOSIS — I442 Atrioventricular block, complete: Secondary | ICD-10-CM | POA: Diagnosis not present

## 2022-08-05 DIAGNOSIS — M25651 Stiffness of right hip, not elsewhere classified: Secondary | ICD-10-CM | POA: Diagnosis not present

## 2022-08-05 DIAGNOSIS — M25551 Pain in right hip: Secondary | ICD-10-CM | POA: Diagnosis not present

## 2022-08-10 DIAGNOSIS — Z96641 Presence of right artificial hip joint: Secondary | ICD-10-CM | POA: Diagnosis not present

## 2022-08-10 DIAGNOSIS — Z961 Presence of intraocular lens: Secondary | ICD-10-CM | POA: Diagnosis not present

## 2022-08-10 DIAGNOSIS — M25651 Stiffness of right hip, not elsewhere classified: Secondary | ICD-10-CM | POA: Diagnosis not present

## 2022-08-10 DIAGNOSIS — H43811 Vitreous degeneration, right eye: Secondary | ICD-10-CM | POA: Diagnosis not present

## 2022-08-10 DIAGNOSIS — M25551 Pain in right hip: Secondary | ICD-10-CM | POA: Diagnosis not present

## 2022-08-30 ENCOUNTER — Other Ambulatory Visit: Payer: Self-pay | Admitting: Family Medicine

## 2022-09-01 NOTE — Telephone Encounter (Signed)
Requested Prescriptions  Refused Prescriptions Disp Refills   alendronate (FOSAMAX) 70 MG tablet [Pharmacy Med Name: ALENDRONATE SODIUM 70 MG TAB] 12 tablet 0    Sig: TAKE 1 TABLET BY MOUTH EVERY 7 (SEVEN) DAYS. TAKE WITH A FULL GLASS OF WATER ON AN EMPTY STOMACH.     Endocrinology:  Bisphosphonates Failed - 08/30/2022  2:05 PM      Failed - Vitamin D in normal range and within 360 days    Vit D, 25-Hydroxy  Date Value Ref Range Status  07/23/2022 20.6 (L) 30.0 - 100.0 ng/mL Final    Comment:    Vitamin D deficiency has been defined by the Institute of Medicine and an Endocrine Society practice guideline as a level of serum 25-OH vitamin D less than 20 ng/mL (1,2). The Endocrine Society went on to further define vitamin D insufficiency as a level between 21 and 29 ng/mL (2). 1. IOM (Institute of Medicine). 2010. Dietary reference    intakes for calcium and D. [place]: The    [employer]. 2. Holick MF, [place], Bischoff-Ferrari HA, et al.    Evaluation, treatment, and prevention of vitamin D    deficiency: an Endocrine Society clinical practice    guideline. JCEM. 2011 Jul; 96(7):1911-30.          Failed - Mg Level in normal range and within 360 days    No results found for: "MG"       Failed - Phosphate in normal range and within 360 days    No results found for: "PHOS"       Passed - Ca in normal range and within 360 days    Calcium  Date Value Ref Range Status  07/23/2022 9.5 8.7 - 10.3 mg/dL Final   Calcium, Total  Date Value Ref Range Status  05/29/2014 9.1 8.5 - 10.1 mg/dL Final         Passed - Cr in normal range and within 360 days    Creatinine  Date Value Ref Range Status  05/29/2014 0.96 0.60 - 1.30 mg/dL Final   Creatinine, Ser  Date Value Ref Range Status  07/23/2022 0.99 0.57 - 1.00 mg/dL Final         Passed - eGFR is 30 or above and within 360 days    EGFR (African American)  Date Value Ref Range Status  05/29/2014 >60  >52mL/min Final   GFR calc Af Amer  Date Value Ref Range Status  07/05/2019 85 >59 mL/min/1.73 Final   EGFR (Non-African Amer.)  Date Value Ref Range Status  05/29/2014 >60 >89mL/min Final    Comment:    eGFR values <40mL/min/1.73 m2 may be an indication of chronic kidney disease (CKD). Calculated eGFR, using the MRDR Study equation, is useful in  patients with stable renal function. The eGFR calculation will not be reliable in acutely ill patients when serum creatinine is changing rapidly. It is not useful in patients on dialysis. The eGFR calculation may not be applicable to patients at the low and high extremes of body sizes, pregnant women, and vegetarians.    GFR calc non Af Amer  Date Value Ref Range Status  07/05/2019 74 >59 mL/min/1.73 Final   eGFR  Date Value Ref Range Status  07/23/2022 59 (L) >59 mL/min/1.73 Final         Passed - Valid encounter within last 12 months    Recent Outpatient Visits           1 month ago  Encounter for annual physical exam   [facility] [facility] [place], Marzella Schlein, MD   3 months ago Upper respiratory tract infection, unspecified type   [facility] [place], Marzella Schlein, MD   4 months ago Dysuria   [facility] Alfredia Ferguson, PA-C   4 months ago Right hip pain   [place] [place], Marzella Schlein, MD   10 months ago Mass of upper inner quadrant of left breast   [place] [facility] [place], Marzella Schlein, MD       Future Appointments             In 10 months [place], Marzella Schlein, MD [facility]            Passed - Bone Mineral Density or Dexa Scan completed in the last 2 years

## 2022-10-06 ENCOUNTER — Ambulatory Visit
Admission: RE | Admit: 2022-10-06 | Discharge: 2022-10-06 | Disposition: A | Discharge status: Home / Self Care | Payer: PPO | Source: Ambulatory Visit | Attending: Family Medicine | Admitting: Family Medicine

## 2022-10-06 DIAGNOSIS — Z1231 Encounter for screening mammogram for malignant neoplasm of breast: Secondary | ICD-10-CM | POA: Diagnosis not present

## 2022-10-08 ENCOUNTER — Other Ambulatory Visit: Payer: Self-pay | Admitting: Family Medicine

## 2022-10-08 DIAGNOSIS — R928 Other abnormal and inconclusive findings on diagnostic imaging of breast: Secondary | ICD-10-CM

## 2022-10-08 DIAGNOSIS — N6489 Other specified disorders of breast: Secondary | ICD-10-CM

## 2022-10-12 ENCOUNTER — Other Ambulatory Visit: Payer: Self-pay | Admitting: Family Medicine

## 2022-10-12 DIAGNOSIS — R928 Other abnormal and inconclusive findings on diagnostic imaging of breast: Secondary | ICD-10-CM

## 2022-10-12 DIAGNOSIS — N6489 Other specified disorders of breast: Secondary | ICD-10-CM

## 2022-10-15 NOTE — Progress Notes (Signed)
Incomplete breast screening; additional screening tests indicated.  Jacky Kindle, FNP  [facility] [address] [place], [place] [address] [phone] (phone) [phone] (fax) [facility]

## 2022-10-21 ENCOUNTER — Ambulatory Visit
Admission: RE | Admit: 2022-10-21 | Discharge: 2022-10-21 | Disposition: A | Discharge status: Home / Self Care | Payer: PPO | Source: Ambulatory Visit | Attending: Family Medicine | Admitting: Family Medicine

## 2022-10-21 DIAGNOSIS — N6489 Other specified disorders of breast: Secondary | ICD-10-CM

## 2022-10-21 DIAGNOSIS — R92333 Mammographic heterogeneous density, bilateral breasts: Secondary | ICD-10-CM | POA: Diagnosis not present

## 2022-10-21 DIAGNOSIS — R928 Other abnormal and inconclusive findings on diagnostic imaging of breast: Secondary | ICD-10-CM | POA: Insufficient documentation

## 2022-11-03 DIAGNOSIS — Z85828 Personal history of other malignant neoplasm of skin: Secondary | ICD-10-CM | POA: Diagnosis not present

## 2022-11-03 DIAGNOSIS — L570 Actinic keratosis: Secondary | ICD-10-CM | POA: Diagnosis not present

## 2022-11-03 DIAGNOSIS — L853 Xerosis cutis: Secondary | ICD-10-CM | POA: Diagnosis not present

## 2022-11-03 DIAGNOSIS — Z859 Personal history of malignant neoplasm, unspecified: Secondary | ICD-10-CM | POA: Diagnosis not present

## 2022-11-03 DIAGNOSIS — L821 Other seborrheic keratosis: Secondary | ICD-10-CM | POA: Diagnosis not present

## 2022-11-03 DIAGNOSIS — L578 Other skin changes due to chronic exposure to nonionizing radiation: Secondary | ICD-10-CM | POA: Diagnosis not present

## 2022-11-03 DIAGNOSIS — Z86018 Personal history of other benign neoplasm: Secondary | ICD-10-CM | POA: Diagnosis not present

## 2022-11-03 DIAGNOSIS — Z872 Personal history of diseases of the skin and subcutaneous tissue: Secondary | ICD-10-CM | POA: Diagnosis not present

## 2022-11-03 DIAGNOSIS — G548 Other nerve root and plexus disorders: Secondary | ICD-10-CM | POA: Diagnosis not present

## 2022-11-03 DIAGNOSIS — L603 Nail dystrophy: Secondary | ICD-10-CM | POA: Diagnosis not present

## 2022-11-09 ENCOUNTER — Ambulatory Visit (INDEPENDENT_AMBULATORY_CARE_PROVIDER_SITE_OTHER): Payer: PPO | Admitting: Family Medicine

## 2022-11-09 ENCOUNTER — Encounter: Payer: Self-pay | Admitting: Family Medicine

## 2022-11-09 VITALS — BP 125/76 | HR 65 | Temp 97.7°F | Resp 13 | Ht 61.5 in | Wt 109.7 lb

## 2022-11-09 DIAGNOSIS — M545 Low back pain, unspecified: Secondary | ICD-10-CM

## 2022-11-09 DIAGNOSIS — R1032 Left lower quadrant pain: Secondary | ICD-10-CM | POA: Diagnosis not present

## 2022-11-09 DIAGNOSIS — K5900 Constipation, unspecified: Secondary | ICD-10-CM

## 2022-11-09 DIAGNOSIS — R5383 Other fatigue: Secondary | ICD-10-CM | POA: Diagnosis not present

## 2022-11-09 DIAGNOSIS — R7303 Prediabetes: Secondary | ICD-10-CM

## 2022-11-09 MED ORDER — CELECOXIB 200 MG PO CAPS: 200.0000 mg | ORAL_CAPSULE | Freq: Every day | ORAL | 1 refills | Status: DC

## 2022-11-09 NOTE — Progress Notes (Signed)
Established Patient Office Visit  Subjective   Patient ID: Natalie Rosales, female    DOB: 16-Jun-1945  Age: 77 y.o. MRN: [id number]  Chief Complaint  Patient presents with   Back Pain    Back Pain    Discussed the use of AI scribe software for clinical note transcription with the patient, who gave verbal consent to proceed.  History of Present Illness   The patient, with a history of right hip replacement, presents with severe left lower back pain that radiates to the front. The pain is most severe in the morning, sometimes to the point of causing nausea, but improves with movement. The patient also reports an increase in fatigue and bowel irregularities, including constipation and occasional leakage. The patient has tried managing the back pain with Tylenol and occasional Gabapentin, but reports minimal relief. The patient has also previously taken Celebrex for pain management but discontinued due to bruising. The patient has a history of hysterectomy and does not have ovaries.        Review of Systems  Musculoskeletal:  Positive for back pain.   per HPI    Objective:     BP 125/76 (BP Location: Right Arm, Patient Position: Sitting, Cuff Size: Normal)   Pulse 65   Temp 97.7 F (36.5 C) (Oral)   Resp 13   Ht 5' 1.5" (1.562 m)   Wt 109 lb 11.2 oz (49.8 kg)   SpO2 96%   BMI 20.39 kg/m    Physical Exam Vitals reviewed.  Constitutional:      General: She is not in acute distress.    Appearance: Normal appearance. She is well-developed. She is not diaphoretic.  HENT:     Head: Normocephalic and atraumatic.  Eyes:     General: No scleral icterus.    Conjunctiva/sclera: Conjunctivae normal.  Neck:     Thyroid: No thyromegaly.  Cardiovascular:     Rate and Rhythm: Normal rate and regular rhythm.     Heart sounds: Normal heart sounds. No murmur heard. Pulmonary:     Effort: Pulmonary effort is normal. No respiratory distress.     Breath sounds: Normal breath  sounds. No wheezing, rhonchi or rales.  Abdominal:     General: There is no distension.     Palpations: Abdomen is soft.     Tenderness: There is no abdominal tenderness.  Musculoskeletal:     Cervical back: Neck supple.     Right lower leg: No edema.     Left lower leg: No edema.  Lymphadenopathy:     Cervical: No cervical adenopathy.  Skin:    General: Skin is warm and dry.     Findings: No rash.  Neurological:     Mental Status: She is alert and oriented to person, place, and time. Mental status is at baseline.  Psychiatric:        Mood and Affect: Mood normal.        Behavior: Behavior normal.    Physical Exam   MUSCULOSKELETAL: Tenderness upon palpation at left lower back, size of patient's hand, extending to front side. Right side no tenderness upon palpation. Pain upon palpation of hip bone, described as touchy. No pain on straight leg raise test. Strength and sensation intact in LEs       No results found for any visits on 11/09/22.    The 10-year ASCVD risk score (Arnett DK, et al., 2019) is: 18.8%    Assessment & Plan:   Problem List Items  Addressed This Visit       Other   Low back pain   Relevant Medications   celecoxib (CELEBREX) 200 MG capsule   LLQ pain   Constipation - Primary   Prediabetes   Relevant Orders   Hemoglobin A1c   Other Visit Diagnoses     Bilateral low back pain       Relevant Medications   celecoxib (CELEBREX) 200 MG capsule   Fatigue, unspecified type       Relevant Orders   CBC with Differential/Platelet   Comprehensive metabolic panel   Vitamin B12   VITAMIN D 25 Hydroxy (Vit-D Deficiency, Fractures)   TSH          Lower Back Pain: Severe pain in the left lower back, radiating to the front, worse in the morning and improving with movement. No radicular symptoms. Possible etiologies include sacroiliac joint inflammation. -Resume Celebrex 200mg  daily for potential inflammation. -Consider X-ray of the lower back and hip  if pain persists or worsens.  Constipation: Reports of alternating constipation and loose stools, with some relief of lower abdominal pain after bowel movement. Possible irritable bowel syndrome. -Start daily Miralax for bowel regulation. -Consider imaging if pain persists or worsens after bowel regulation.  Fatigue: Reports of feeling tired and lacking energy. -Order lab work including B12, vitamin D, thyroid function, blood counts, kidney and liver function, and blood sugar to evaluate for potential causes of fatigue.  Follow-up in April or sooner if symptoms worsen.        No follow-ups on file.    Shirlee Latch, MD

## 2022-11-10 LAB — COMPREHENSIVE METABOLIC PANEL
ALT: 11 IU/L (ref 0–32)
AST: 22 IU/L (ref 0–40)
Albumin: 4.4 g/dL (ref 3.8–4.8)
Alkaline Phosphatase: 67 IU/L (ref 44–121)
BUN/Creatinine Ratio: 11 — ABNORMAL LOW (ref 12–28)
BUN: 9 mg/dL (ref 8–27)
Bilirubin Total: 0.4 mg/dL (ref 0.0–1.2)
CO2: 26 mmol/L (ref 20–29)
Calcium: 9.9 mg/dL (ref 8.7–10.3)
Chloride: 100 mmol/L (ref 96–106)
Creatinine, Ser: 0.85 mg/dL (ref 0.57–1.00)
Globulin, Total: 2.3 g/dL (ref 1.5–4.5)
Glucose: 89 mg/dL (ref 70–99)
Potassium: 5 mmol/L (ref 3.5–5.2)
Sodium: 140 mmol/L (ref 134–144)
Total Protein: 6.7 g/dL (ref 6.0–8.5)
eGFR: 71 mL/min/{1.73_m2} (ref >59)

## 2022-11-10 LAB — CBC WITH DIFFERENTIAL/PLATELET
Basophils Absolute: 0 10*3/uL (ref 0.0–0.2)
Basos: 1 %
EOS (ABSOLUTE): 0.3 10*3/uL (ref 0.0–0.4)
Eos: 4 %
Hematocrit: 37.7 % (ref 34.0–46.6)
Hemoglobin: 12.4 g/dL (ref 11.1–15.9)
Immature Grans (Abs): 0 10*3/uL (ref 0.0–0.1)
Immature Granulocytes: 0 %
Lymphocytes Absolute: 2.6 10*3/uL (ref 0.7–3.1)
Lymphs: 34 %
MCH: 29.1 pg (ref 26.6–33.0)
MCHC: 32.9 g/dL (ref 31.5–35.7)
MCV: 89 fL (ref 79–97)
Monocytes Absolute: 0.7 10*3/uL (ref 0.1–0.9)
Monocytes: 9 %
Neutrophils Absolute: 3.9 10*3/uL (ref 1.4–7.0)
Neutrophils: 52 %
Platelets: 286 10*3/uL (ref 150–450)
RBC: 4.26 x10E6/uL (ref 3.77–5.28)
RDW: 13.3 % (ref 11.7–15.4)
WBC: 7.4 10*3/uL (ref 3.4–10.8)

## 2022-11-10 LAB — VITAMIN D 25 HYDROXY (VIT D DEFICIENCY, FRACTURES): Vit D, 25-Hydroxy: 23.7 ng/mL — ABNORMAL LOW (ref 30.0–100.0)

## 2022-11-10 LAB — HEMOGLOBIN A1C
Est. average glucose Bld gHb Est-mCnc: 134 mg/dL
Hgb A1c MFr Bld: 6.3 % — ABNORMAL HIGH (ref 4.8–5.6)

## 2022-11-10 LAB — TSH: TSH: 1.61 u[IU]/mL (ref 0.450–4.500)

## 2022-11-10 LAB — VITAMIN B12: Vitamin B-12: 244 pg/mL (ref 232–1245)

## 2022-12-16 DIAGNOSIS — I493 Ventricular premature depolarization: Secondary | ICD-10-CM | POA: Diagnosis not present

## 2022-12-16 DIAGNOSIS — I471 Supraventricular tachycardia, unspecified: Secondary | ICD-10-CM | POA: Diagnosis not present

## 2022-12-16 DIAGNOSIS — I442 Atrioventricular block, complete: Secondary | ICD-10-CM | POA: Diagnosis not present

## 2023-02-23 ENCOUNTER — Other Ambulatory Visit: Payer: Self-pay | Admitting: Family Medicine

## 2023-02-24 NOTE — Telephone Encounter (Signed)
Requested medication (s) are due for refill today:   Yes  Requested medication (s) are on the active medication list:   Yes  Future visit scheduled:   Yes 07/27/2023 with Dr. B.   Last ordered: 07/21/2022 #12, 0 refills  Unable to refill because Mag. And Phos. Are due per protocol.    Requested Prescriptions  Pending Prescriptions Disp Refills   alendronate (FOSAMAX) 70 MG tablet [Pharmacy Med Name: ALENDRONATE SODIUM 70 MG TAB] 12 tablet 0    Sig: TAKE 1 TABLET BY MOUTH EVERY 7 (SEVEN) DAYS. TAKE WITH A FULL GLASS OF WATER ON AN EMPTY STOMACH.     Endocrinology:  Bisphosphonates Failed - 02/23/2023  8:52 AM      Failed - Vitamin D in normal range and within 360 days    Vit D, 25-Hydroxy  Date Value Ref Range Status  11/09/2022 23.7 (L) 30.0 - 100.0 ng/mL Final    Comment:    Vitamin D deficiency has been defined by the Institute of Medicine and an Endocrine Society practice guideline as a level of serum 25-OH vitamin D less than 20 ng/mL (1,2). The Endocrine Society went on to further define vitamin D insufficiency as a level between 21 and 29 ng/mL (2). 1. IOM (Institute of Medicine). 2010. Dietary reference    intakes for calcium and D. [place]: The    [employer]. 2. Holick MF, Binkley Keizer, Bischoff-Ferrari HA, et al.    Evaluation, treatment, and prevention of vitamin D    deficiency: an Endocrine Society clinical practice    guideline. JCEM. 2011 Jul; 96(7):1911-30.          Failed - Mg Level in normal range and within 360 days    No results found for: "MG"       Failed - Phosphate in normal range and within 360 days    No results found for: "PHOS"       Passed - Ca in normal range and within 360 days    Calcium  Date Value Ref Range Status  11/09/2022 9.9 8.7 - 10.3 mg/dL Final   Calcium, Total  Date Value Ref Range Status  05/29/2014 9.1 8.5 - 10.1 mg/dL Final         Passed - Cr in normal range and within 360 days    Creatinine  Date  Value Ref Range Status  05/29/2014 0.96 0.60 - 1.30 mg/dL Final   Creatinine, Ser  Date Value Ref Range Status  11/09/2022 0.85 0.57 - 1.00 mg/dL Final         Passed - eGFR is 30 or above and within 360 days    EGFR (African American)  Date Value Ref Range Status  05/29/2014 >60 >97mL/min Final   GFR calc Af Amer  Date Value Ref Range Status  07/05/2019 85 >59 mL/min/1.73 Final   EGFR (Non-African Amer.)  Date Value Ref Range Status  05/29/2014 >60 >35mL/min Final    Comment:    eGFR values <53mL/min/1.73 m2 may be an indication of chronic kidney disease (CKD). Calculated eGFR, using the MRDR Study equation, is useful in  patients with stable renal function. The eGFR calculation will not be reliable in acutely ill patients when serum creatinine is changing rapidly. It is not useful in patients on dialysis. The eGFR calculation may not be applicable to patients at the low and high extremes of body sizes, pregnant women, and vegetarians.    GFR calc non Af Amer  Date Value Ref Range  Status  07/05/2019 74 >59 mL/min/1.73 Final   eGFR  Date Value Ref Range Status  11/09/2022 71 >59 mL/min/1.73 Final         Passed - Valid encounter within last 12 months    Recent Outpatient Visits           3 months ago Constipation, unspecified constipation type   [facility] [place], Marzella Schlein, MD   7 months ago Encounter for annual physical exam   [facility] [place], Marzella Schlein, MD   9 months ago Upper respiratory tract infection, unspecified type   [facility] [place], Marzella Schlein, MD   9 months ago Dysuria   [facility] Alfredia Ferguson, PA-C   10 months ago Right hip pain   [facility] [place], Marzella Schlein, MD       Future Appointments             In 5 months Bacigalupo, Marzella Schlein, MD [facility]             Passed - Bone Mineral Density or Dexa Scan completed in the last 2 years

## 2023-04-12 DIAGNOSIS — L570 Actinic keratosis: Secondary | ICD-10-CM | POA: Diagnosis not present

## 2023-05-02 ENCOUNTER — Other Ambulatory Visit: Payer: Self-pay | Admitting: Family Medicine

## 2023-05-02 DIAGNOSIS — M545 Low back pain, unspecified: Secondary | ICD-10-CM

## 2023-05-04 NOTE — Telephone Encounter (Signed)
 Requested Prescriptions  Pending Prescriptions Disp Refills   celecoxib  (CELEBREX ) 200 MG capsule [Pharmacy Med Name: CELECOXIB  200 MG CAPSULE] 90 capsule 1    Sig: TAKE 1 CAPSULE BY MOUTH EVERY DAY     Analgesics:  COX2 Inhibitors Failed - 05/04/2023  3:17 PM      Failed - Manual Review: Labs are only required if the patient has taken medication for more than 8 weeks.      Passed - HGB in normal range and within 360 days    Hemoglobin  Date Value Ref Range Status  11/09/2022 12.4 11.1 - 15.9 g/dL Final         Passed - Cr in normal range and within 360 days    Creatinine  Date Value Ref Range Status  05/29/2014 0.96 0.60 - 1.30 mg/dL Final   Creatinine, Ser  Date Value Ref Range Status  11/09/2022 0.85 0.57 - 1.00 mg/dL Final         Passed - HCT in normal range and within 360 days    Hematocrit  Date Value Ref Range Status  11/09/2022 37.7 34.0 - 46.6 % Final         Passed - AST in normal range and within 360 days    AST  Date Value Ref Range Status  11/09/2022 22 0 - 40 IU/L Final         Passed - ALT in normal range and within 360 days    ALT  Date Value Ref Range Status  11/09/2022 11 0 - 32 IU/L Final         Passed - eGFR is 30 or above and within 360 days    EGFR (African American)  Date Value Ref Range Status  05/29/2014 >60 >69mL/min Final   GFR calc Af Amer  Date Value Ref Range Status  07/05/2019 85 >59 mL/min/1.73 Final   EGFR (Non-African Amer.)  Date Value Ref Range Status  05/29/2014 >60 >34mL/min Final    Comment:    eGFR values <88mL/min/1.73 m2 may be an indication of chronic kidney disease (CKD). Calculated eGFR, using the MRDR Study equation, is useful in  patients with stable renal function. The eGFR calculation will not be reliable in acutely ill patients when serum creatinine is changing rapidly. It is not useful in patients on dialysis. The eGFR calculation may not be applicable to patients at the low and high extremes of body  sizes, pregnant women, and vegetarians.    GFR calc non Af Amer  Date Value Ref Range Status  07/05/2019 74 >59 mL/min/1.73 Final   eGFR  Date Value Ref Range Status  11/09/2022 71 >59 mL/min/1.73 Final         Passed - Patient is not pregnant      Passed - Valid encounter within last 12 months    Recent Outpatient Visits           5 months ago Constipation, unspecified constipation type   [place] [facility] [place], Jon HERO, MD   9 months ago Encounter for annual physical exam   [facility] [place], Jon HERO, MD   12 months ago Upper respiratory tract infection, unspecified type   [facility] [place], Jon HERO, MD   1 year ago Dysuria   [facility] [place] Cyndi Shaver, PA-C   1 year ago Right hip pain   [facility] [place], Jon HERO, MD  Future Appointments             In 2 months Bacigalupo, Jon HERO, MD [facility]

## 2023-05-17 DIAGNOSIS — C44712 Basal cell carcinoma of skin of right lower limb, including hip: Secondary | ICD-10-CM | POA: Diagnosis not present

## 2023-05-17 DIAGNOSIS — Z86018 Personal history of other benign neoplasm: Secondary | ICD-10-CM | POA: Diagnosis not present

## 2023-05-17 DIAGNOSIS — L821 Other seborrheic keratosis: Secondary | ICD-10-CM | POA: Diagnosis not present

## 2023-05-17 DIAGNOSIS — L570 Actinic keratosis: Secondary | ICD-10-CM | POA: Diagnosis not present

## 2023-05-17 DIAGNOSIS — L578 Other skin changes due to chronic exposure to nonionizing radiation: Secondary | ICD-10-CM | POA: Diagnosis not present

## 2023-05-17 DIAGNOSIS — Z872 Personal history of diseases of the skin and subcutaneous tissue: Secondary | ICD-10-CM | POA: Diagnosis not present

## 2023-05-17 DIAGNOSIS — Z85828 Personal history of other malignant neoplasm of skin: Secondary | ICD-10-CM | POA: Diagnosis not present

## 2023-05-17 DIAGNOSIS — L853 Xerosis cutis: Secondary | ICD-10-CM | POA: Diagnosis not present

## 2023-05-17 DIAGNOSIS — D485 Neoplasm of uncertain behavior of skin: Secondary | ICD-10-CM | POA: Diagnosis not present

## 2023-05-17 DIAGNOSIS — L2089 Other atopic dermatitis: Secondary | ICD-10-CM | POA: Diagnosis not present

## 2023-05-17 DIAGNOSIS — Z859 Personal history of malignant neoplasm, unspecified: Secondary | ICD-10-CM | POA: Diagnosis not present

## 2023-06-01 DIAGNOSIS — I442 Atrioventricular block, complete: Secondary | ICD-10-CM | POA: Diagnosis not present

## 2023-06-21 DIAGNOSIS — C4491 Basal cell carcinoma of skin, unspecified: Secondary | ICD-10-CM | POA: Diagnosis not present

## 2023-06-21 DIAGNOSIS — C44712 Basal cell carcinoma of skin of right lower limb, including hip: Secondary | ICD-10-CM | POA: Diagnosis not present

## 2023-06-28 DIAGNOSIS — L0390 Cellulitis, unspecified: Secondary | ICD-10-CM | POA: Diagnosis not present

## 2023-07-12 DIAGNOSIS — Z96641 Presence of right artificial hip joint: Secondary | ICD-10-CM | POA: Diagnosis not present

## 2023-07-14 DIAGNOSIS — R0602 Shortness of breath: Secondary | ICD-10-CM | POA: Diagnosis not present

## 2023-07-14 DIAGNOSIS — I471 Supraventricular tachycardia, unspecified: Secondary | ICD-10-CM | POA: Diagnosis not present

## 2023-07-14 DIAGNOSIS — I442 Atrioventricular block, complete: Secondary | ICD-10-CM | POA: Diagnosis not present

## 2023-07-21 DIAGNOSIS — I442 Atrioventricular block, complete: Secondary | ICD-10-CM | POA: Diagnosis not present

## 2023-07-21 DIAGNOSIS — R0602 Shortness of breath: Secondary | ICD-10-CM | POA: Diagnosis not present

## 2023-07-27 ENCOUNTER — Encounter: Payer: Self-pay | Admitting: Family Medicine

## 2023-07-27 ENCOUNTER — Ambulatory Visit (INDEPENDENT_AMBULATORY_CARE_PROVIDER_SITE_OTHER): Payer: Self-pay

## 2023-07-27 DIAGNOSIS — Z780 Asymptomatic menopausal state: Secondary | ICD-10-CM | POA: Diagnosis not present

## 2023-07-27 DIAGNOSIS — Z0000 Encounter for general adult medical examination without abnormal findings: Secondary | ICD-10-CM | POA: Diagnosis not present

## 2023-07-27 NOTE — Patient Instructions (Addendum)
 Natalie Rosales , Thank you for taking time to come for your Medicare Wellness Visit. I appreciate your ongoing commitment to your health goals. Please review the following plan we discussed and let me know if I can assist you in the future.   Referrals/Orders/Follow-Ups/Clinician Recommendations: ORDERED BONE DENSITY SCAN You have an order for:  []   2D Mammogram  []   3D Mammogram  [x]   Bone Density     Please call for appointment:  [facility]  [address] [place] [address] [phone] [facility] [address] [place], [place] [address] [phone] [facility] [address] Geanie Logan [place], [place] [address] [phone]   Make sure to wear two-piece clothing.  No lotions, powders, or deodorants the day of the appointment. Make sure to bring picture ID and insurance card.  Bring list of medications you are currently taking including any supplements.   Schedule your [place] screening mammogram through MyChart!   Log into your MyChart account.  Go to 'Visit' (or 'Appointments' if on mobile App) --> Schedule an Appointment  Under 'Select a Reason for Visit' choose the Mammogram Screening option.  Complete the pre-visit questions and select the time and place that best fits your schedule.   This is a list of the screening recommended for you and due dates:  Health Maintenance  Topic Date Due   Zoster (Shingles) Vaccine (1 of 2) Never done   COVID-19 Vaccine (4 - 2024-25 season) 12/27/2022   DEXA scan (bone density measurement)  09/17/2023   Mammogram  10/06/2023   Flu Shot  11/26/2023   Medicare Annual Wellness Visit  07/26/2024   DTaP/Tdap/Td vaccine (2 - Td or Tdap) 03/04/2027   Pneumonia Vaccine  Completed   Hepatitis C Screening  Completed   HPV Vaccine  Aged Out   Colon Cancer Screening  Discontinued    Advanced directives: (ACP  Link)Information on Advanced Care Planning can be found at [facility] Advance Health Care Directives Advance Health Care Directives. [email or web address]   Next Medicare Annual Wellness Visit scheduled for next year: Yes   08/01/24 @ 3:50 PM BY PHONE

## 2023-07-27 NOTE — Progress Notes (Signed)
 Subjective:   Natalie Rosales is a 78 y.o. who presents for a Medicare Wellness preventive visit.  Visit Complete: Virtual I connected with  Jene Every on 07/27/23 by a audio enabled telemedicine application and verified that I am speaking with the correct person using two identifiers.  Patient Location: Home  Provider Location: Office/Clinic  I discussed the limitations of evaluation and management by telemedicine. The patient expressed understanding and agreed to proceed.  Vital Signs: Because this visit was a virtual/telehealth visit, some criteria may be missing or patient reported. Any vitals not documented were not able to be obtained and vitals that have been documented are patient reported.  VideoDeclined- This patient declined Librarian, academic. Therefore the visit was completed with audio only.  Persons Participating in Visit: Patient.  AWV Questionnaire: No: Patient Medicare AWV questionnaire was not completed prior to this visit.  Cardiac Risk Factors include: advanced age (>87men, >18 women)     Objective:    There were no vitals filed for this visit. There is no height or weight on file to calculate BMI.     07/27/2023    4:02 PM 07/22/2022    2:12 PM 07/21/2021    8:29 AM 07/08/2020    9:11 AM 12/13/2019    7:30 AM 11/15/2019    9:36 AM 05/23/2019   11:20 AM  Advanced Directives  Does Patient Have a Medical Advance Directive? No No No No No No No  Would patient like information on creating a medical advance directive? No - Patient declined  No - Patient declined No - Patient declined No - Patient declined No - Patient declined No - Patient declined    Current Medications (verified) Outpatient Encounter Medications as of 07/27/2023  Medication Sig   alendronate (FOSAMAX) 70 MG tablet TAKE 1 TABLET BY MOUTH EVERY 7 (SEVEN) DAYS. TAKE WITH A FULL GLASS OF WATER ON AN EMPTY STOMACH.   ALPRAZolam (XANAX) 0.25 MG tablet TAKE 1 TABLET  BY MOUTH AT BEDTIME AS NEEDED FOR ANXIETY.   celecoxib (CELEBREX) 200 MG capsule TAKE 1 CAPSULE BY MOUTH EVERY DAY   fluorouracil (EFUDEX) 5 % cream    mometasone (NASONEX) 50 MCG/ACT nasal spray Place 2 sprays into the nose daily.   omeprazole (PRILOSEC) 20 MG capsule TAKE 1 CAPSULE (20 MG TOTAL) BY MOUTH DAILY. AS NEEDED ONLY   aspirin 81 MG chewable tablet Chew by mouth daily. (Patient not taking: Reported on 07/27/2023)   methocarbamol (ROBAXIN) 500 MG tablet Take 500 mg by mouth every 8 (eight) hours as needed for muscle spasms. (Patient not taking: Reported on 07/27/2023)   No facility-administered encounter medications on file as of 07/27/2023.    Allergies (verified) Lisinopril, Oxycodone, Peanut allergen powder-dnfp, Peanut-containing drug products, Tramadol, and Egg-derived products   History: Past Medical History:  Diagnosis Date   Allergy    Anxiety    Arthritis    back   Back pain    lower back   Breast cancer (HCC) 2005    left breast ca, Lumpectomy, f/u with radiation    Cervical cancer (HCC)    Closed fracture of intracapsular section of femur (HCC) 11/17/2011   Complete heart block (HCC)    Pacemaker placed 2005   Hyperlipidemia    Personal history of radiation therapy    PONV (postoperative nausea and vomiting)    Presence of permanent cardiac pacemaker 06/05/2013   Adapta DR [id number]   Wears contact lenses    Wears  hearing aid    bilateral   Past Surgical History:  Procedure Laterality Date   ABDOMINAL HYSTERECTOMY     APPENDECTOMY  1982   BREAST BIOPSY Left 11/19/2021   [place] bx, 11:30 4 cmfn, ribbon marker, path pending   BREAST LUMPECTOMY Left 2005   f/u radiation    CARDIAC PACEMAKER PLACEMENT  2005   CATARACT EXTRACTION W/PHACO Right 11/15/2019   Procedure: CATARACT EXTRACTION PHACO AND INTRAOCULAR LENS PLACEMENT (IOC) RIGHT;  Surgeon: Lockie Mola, MD;  [facility];  Service: Ophthalmology;  Laterality: Right;  8.61 1:12.4 11.9%    CATARACT EXTRACTION W/PHACO Left 12/13/2019   Procedure: CATARACT EXTRACTION PHACO AND INTRAOCULAR LENS PLACEMENT (IOC) LEFT TORIC LENS;  Surgeon: Lockie Mola, MD;  [facility];  Service: Ophthalmology;  Laterality: Left;  9.12 1:13.5 12.4%   CHOLECYSTECTOMY  1982   COLONOSCOPY WITH PROPOFOL N/A 06/10/2015   Procedure: COLONOSCOPY WITH PROPOFOL;  Surgeon: Midge Minium, MD;  [facility];  Service: Endoscopy;  Laterality: N/A;   HIP FRACTURE SURGERY Right 2012   Three pins    Family History  Problem Relation Age of Onset   Cancer Mother    Cancer Father        Lymphoma or Pancreatic Cancer   Pancreatic cancer Father    Lymphoma Father    Heart disease Sister    Breast cancer Sister    Bladder Cancer Brother    Lymphoma Brother    Breast cancer Cousin        2 mat cousins   Esophageal cancer Niece    Social History   Socioeconomic History   Marital status: Married    Spouse name: Harvie Heck   Number of children: 3   Years of education: College   Highest education level: Associate degree: academic program  Occupational History   Occupation: Hair Dresser    Comment: 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  Tobacco Use   Smoking status: Former    Current packs/day: 0.00    Types: Cigarettes    Quit date: 2005    Years since quitting: 20.2   Smokeless tobacco: Never   Tobacco comments:    quit 2005 (was social smoker only)  Vaping Use   Vaping status: Never Used  Substance and Sexual Activity   Alcohol use: Not Currently   Drug use: No   Sexual activity: Not Currently  Other Topics Concern   Not on file  Social History Narrative   Not on file   Social Drivers of Health   Financial Resource Strain: Low Risk  (07/27/2023)   Overall Financial Resource Strain (CARDIA)    Difficulty of Paying Living Expenses: Not hard at all  Food Insecurity: No Food Insecurity (07/27/2023)   Hunger Vital Sign    Worried About Running Out of Food in the Last Year:  Never true    Ran Out of Food in the Last Year: Never true  Transportation Needs: No Transportation Needs (07/27/2023)   PRAPARE - Administrator, Civil Service (Medical): No    Lack of Transportation (Non-Medical): No  Physical Activity: Insufficiently Active (07/27/2023)   Exercise Vital Sign    Days of Exercise per Week: 3 days    Minutes of Exercise per Session: 30 min  Stress: No Stress Concern Present (07/27/2023)   [employer] of Occupational Health - Occupational Stress Questionnaire    Feeling of Stress : Only a little  Social Connections: Moderately Integrated (07/27/2023)   Social Connection  and Isolation Panel [NHANES]    Frequency of Communication with Friends and Family: More than three times a week    Frequency of Social Gatherings with Friends and Family: Three times a week    Attends Religious Services: More than 4 times per year    Active Member of Clubs or Organizations: No    Attends Banker Meetings: Never    Marital Status: Married    Tobacco Counseling Counseling given: Not Answered Tobacco comments: quit 2005 (was social smoker only)    Clinical Intake:  Pre-visit preparation completed: Yes  Pain : No/denies pain     BMI - recorded: 20.3 Nutritional Status: BMI of 19-24  Normal Nutritional Risks: None Diabetes: No  Lab Results  Component Value Date   HGBA1C 6.3 (H) 11/09/2022   HGBA1C 5.5 07/23/2022   HGBA1C 5.7 (H) 07/21/2021     How often do you need to have someone help you when you read instructions, pamphlets, or other written materials from your doctor or pharmacy?: 1 - Never  Interpreter Needed?: No  Information entered by :: Kennedy Bucker, LPN   Activities of Daily Living    07/27/2023    4:03 PM  In your present state of health, do you have any difficulty performing the following activities:  Hearing? 1  Vision? 1  Comment MEMORY & CONCENTRATION LESS THAN NORMAL  Difficulty concentrating or making  decisions? 0  Walking or climbing stairs? 0  Dressing or bathing? 0  Doing errands, shopping? 0  Preparing Food and eating ? N  Using the Toilet? N  In the past six months, have you accidently leaked urine? N  Do you have problems with loss of bowel control? N  Managing your Medications? N  Managing your Finances? N  Housekeeping or managing your Housekeeping? N    Patient Care Team: Erasmo Downer, MD as PCP - General (Family Medicine) Lamar Blinks, MD as Consulting Physician (Cardiology) Lockie Mola, MD as Referring Physician (Ophthalmology) Jesusita Oka, MD (Dermatology)  Indicate any recent Medical Services you may have received from other than [facility] providers in the past year (date may be approximate).     Assessment:   This is a routine wellness examination for [place].  Hearing/Vision screen Hearing Screening - Comments:: WEARS AIDS, BOTH EARS Vision Screening - Comments:: NO GLASSES, HAD CATARACT SGY-  EYE   Goals Addressed             This Visit's Progress    Cut out extra servings         Depression Screen     07/27/2023    4:01 PM 07/22/2022    2:08 PM 05/01/2022    2:54 PM 10/13/2021   10:20 AM 07/21/2021    8:27 AM 07/08/2020    9:07 AM 05/23/2019   11:21 AM  PHQ 2/9 Scores  PHQ - 2 Score 0 0 0 0 0 0 0  PHQ- 9 Score 0  0 3       Fall Risk     07/27/2023    4:03 PM 07/22/2022    2:03 PM 05/01/2022    2:54 PM 10/13/2021   10:20 AM 07/21/2021    8:30 AM  Fall Risk   Falls in the past year? 0 0 0 0 0  Number falls in past yr: 0 0 0 0 0  Injury with Fall? 0 0 0 0 0  Risk for fall due to : No Fall Risks No Fall Risks  No Fall Risks No Fall Risks No Fall Risks  Follow up Falls prevention discussed;Falls evaluation completed Education provided;Falls prevention discussed Falls evaluation completed Falls evaluation completed Falls evaluation completed    MEDICARE RISK AT HOME:  Medicare Risk at Home Any stairs in or around  the home?: Yes If so, are there any without handrails?: No Home free of loose throw rugs in walkways, pet beds, electrical cords, etc?: Yes Adequate lighting in your home to reduce risk of falls?: Yes Life alert?: No Use of a cane, walker or w/c?: No Grab bars in the bathroom?: No Shower chair or bench in shower?: No Elevated toilet seat or a handicapped toilet?: No  TIMED UP AND GO:  Was the test performed?  No  Cognitive Function: 6CIT completed    04/14/2022    9:02 AM  MMSE - Mini Mental State Exam  Orientation to time 5  Orientation to Place 5  Registration 3  Attention/ Calculation 5  Recall 3  Language- name 2 objects 2  Language- repeat 1  Language- follow 3 step command 3  Language- read & follow direction 1  Write a sentence 1  Copy design 1  Total score 30        07/27/2023    4:05 PM 07/22/2022    2:18 PM  6CIT Screen  What Year? 0 points 0 points  What month? 0 points 0 points  What time? 3 points 0 points  Count back from 20 0 points 0 points  Months in reverse 0 points 0 points  Repeat phrase 0 points 0 points  Total Score 3 points 0 points    Immunizations Immunization History  Administered Date(s) Administered   PFIZER Comirnaty(Gray Top)Covid-19 Tri-Sucrose Vaccine 05/26/2019, 06/16/2019   PFIZER(Purple Top)SARS-COV-2 Vaccination 02/19/2020   Pneumococcal Conjugate-13 11/26/2014   Pneumococcal Polysaccharide-23 01/13/2016   Tdap 03/03/2017    Screening Tests Health Maintenance  Topic Date Due   Zoster Vaccines- Shingrix (1 of 2) Never done   COVID-19 Vaccine (4 - 2024-25 season) 12/27/2022   DEXA SCAN  09/17/2023   MAMMOGRAM  10/06/2023   INFLUENZA VACCINE  11/26/2023   Medicare Annual Wellness (AWV)  07/26/2024   DTaP/Tdap/Td (2 - Td or Tdap) 03/04/2027   Pneumonia Vaccine 15+ Years old  Completed   Hepatitis C Screening  Completed   HPV VACCINES  Aged Out   Colonoscopy  Discontinued    Health Maintenance  Health Maintenance  Due  Topic Date Due   Zoster Vaccines- Shingrix (1 of 2) Never done   COVID-19 Vaccine (4 - 2024-25 season) 12/27/2022   Health Maintenance Items Addressed: DEXA ordered  Additional Screening:  Vision Screening: Recommended annual ophthalmology exams for early detection of glaucoma and other disorders of the eye.  Dental Screening: Recommended annual dental exams for proper oral hygiene  Community Resource Referral / Chronic Care Management: CRR required this visit?  No   CCM required this visit?  No     Plan:     I have personally reviewed and noted the following in the patient's chart:   Medical and social history Use of alcohol, tobacco or illicit drugs  Current medications and supplements including opioid prescriptions. Patient is not currently taking opioid prescriptions. Functional ability and status Nutritional status Physical activity Advanced directives List of other physicians Hospitalizations, surgeries, and ER visits in previous 12 months Vitals Screenings to include cognitive, depression, and falls Referrals and appointments  In addition, I have reviewed and discussed with patient certain  preventive protocols, quality metrics, and best practice recommendations. A written personalized care plan for preventive services as well as general preventive health recommendations were provided to patient.     Hal Hope, LPN   12/31/452   After Visit Summary: (MyChart) Due to this being a telephonic visit, the after visit summary with patients personalized plan was offered to patient via MyChart   Notes:  BDS ORDERED

## 2023-07-30 ENCOUNTER — Other Ambulatory Visit: Payer: Self-pay | Admitting: Family Medicine

## 2023-07-30 NOTE — Telephone Encounter (Signed)
 Requested Prescriptions  Pending Prescriptions Disp Refills   omeprazole (PRILOSEC) 20 MG capsule [Pharmacy Med Name: OMEPRAZOLE DR 20 MG CAPSULE] 90 capsule 0    Sig: TAKE 1 CAPSULE (20 MG TOTAL) BY MOUTH DAILY. AS NEEDED ONLY     Gastroenterology: Proton Pump Inhibitors Passed - 07/30/2023  2:39 PM      Passed - Valid encounter within last 12 months    Recent Outpatient Visits   None     Future Appointments             In 1 month Bacigalupo, Marzella Schlein, MD [facility]

## 2023-07-31 ENCOUNTER — Other Ambulatory Visit: Payer: Self-pay | Admitting: Family Medicine

## 2023-07-31 DIAGNOSIS — M545 Low back pain, unspecified: Secondary | ICD-10-CM

## 2023-08-02 NOTE — Telephone Encounter (Signed)
 Requested Prescriptions  Pending Prescriptions Disp Refills   celecoxib (CELEBREX) 200 MG capsule [Pharmacy Med Name: CELECOXIB 200 MG CAPSULE] 90 capsule 0    Sig: TAKE 1 CAPSULE BY MOUTH EVERY DAY     Analgesics:  COX2 Inhibitors Failed - 08/02/2023 11:22 AM      Failed - Manual Review: Labs are only required if the patient has taken medication for more than 8 weeks.      Passed - HGB in normal range and within 360 days    Hemoglobin  Date Value Ref Range Status  11/09/2022 12.4 11.1 - 15.9 g/dL Final         Passed - Cr in normal range and within 360 days    Creatinine  Date Value Ref Range Status  05/29/2014 0.96 0.60 - 1.30 mg/dL Final   Creatinine, Ser  Date Value Ref Range Status  11/09/2022 0.85 0.57 - 1.00 mg/dL Final         Passed - HCT in normal range and within 360 days    Hematocrit  Date Value Ref Range Status  11/09/2022 37.7 34.0 - 46.6 % Final         Passed - AST in normal range and within 360 days    AST  Date Value Ref Range Status  11/09/2022 22 0 - 40 IU/L Final         Passed - ALT in normal range and within 360 days    ALT  Date Value Ref Range Status  11/09/2022 11 0 - 32 IU/L Final         Passed - eGFR is 30 or above and within 360 days    EGFR (African American)  Date Value Ref Range Status  05/29/2014 >60 >32mL/min Final   GFR calc Af Amer  Date Value Ref Range Status  07/05/2019 85 >59 mL/min/1.73 Final   EGFR (Non-African Amer.)  Date Value Ref Range Status  05/29/2014 >60 >21mL/min Final    Comment:    eGFR values <46mL/min/1.73 m2 may be an indication of chronic kidney disease (CKD). Calculated eGFR, using the MRDR Study equation, is useful in  patients with stable renal function. The eGFR calculation will not be reliable in acutely ill patients when serum creatinine is changing rapidly. It is not useful in patients on dialysis. The eGFR calculation may not be applicable to patients at the low and high extremes of body  sizes, pregnant women, and vegetarians.    GFR calc non Af Amer  Date Value Ref Range Status  07/05/2019 74 >59 mL/min/1.73 Final   eGFR  Date Value Ref Range Status  11/09/2022 71 >59 mL/min/1.73 Final         Passed - Patient is not pregnant      Passed - Valid encounter within last 12 months    Recent Outpatient Visits   None     Future Appointments             In 4 weeks Bacigalupo, Marzella Schlein, MD [facility], [facility]

## 2023-08-30 ENCOUNTER — Ambulatory Visit (INDEPENDENT_AMBULATORY_CARE_PROVIDER_SITE_OTHER): Admitting: Family Medicine

## 2023-08-30 ENCOUNTER — Encounter: Payer: Self-pay | Admitting: Family Medicine

## 2023-08-30 VITALS — BP 109/66 | HR 70 | Ht 62.5 in | Wt 115.8 lb

## 2023-08-30 DIAGNOSIS — R7303 Prediabetes: Secondary | ICD-10-CM

## 2023-08-30 DIAGNOSIS — Z0001 Encounter for general adult medical examination with abnormal findings: Secondary | ICD-10-CM | POA: Diagnosis not present

## 2023-08-30 DIAGNOSIS — E782 Mixed hyperlipidemia: Secondary | ICD-10-CM | POA: Diagnosis not present

## 2023-08-30 DIAGNOSIS — M62838 Other muscle spasm: Secondary | ICD-10-CM | POA: Diagnosis not present

## 2023-08-30 DIAGNOSIS — E559 Vitamin D deficiency, unspecified: Secondary | ICD-10-CM

## 2023-08-30 DIAGNOSIS — E0590 Thyrotoxicosis, unspecified without thyrotoxic crisis or storm: Secondary | ICD-10-CM | POA: Diagnosis not present

## 2023-08-30 DIAGNOSIS — Z0000 Encounter for general adult medical examination without abnormal findings: Secondary | ICD-10-CM

## 2023-08-30 DIAGNOSIS — B3731 Acute candidiasis of vulva and vagina: Secondary | ICD-10-CM

## 2023-08-30 DIAGNOSIS — M67442 Ganglion, left hand: Secondary | ICD-10-CM | POA: Diagnosis not present

## 2023-08-30 DIAGNOSIS — Z1231 Encounter for screening mammogram for malignant neoplasm of breast: Secondary | ICD-10-CM

## 2023-08-30 MED ORDER — FLUCONAZOLE 150 MG PO TABS
150.0000 mg | ORAL_TABLET | Freq: Once | ORAL | 0 refills | Status: AC
Start: 2023-08-30 — End: 2023-08-30

## 2023-08-30 NOTE — Patient Instructions (Addendum)
 The CDC recommends two doses of Shingrix (the shingles vaccine) separated by 2 to 6 months for adults age 78 years and older. I recommend checking with your insurance plan regarding coverage for this vaccine.     Call [facility] to schedule a mammogram [phone]

## 2023-08-30 NOTE — Progress Notes (Signed)
 Complete physical exam   Patient: Natalie Rosales   DOB: 08-Jan-1946   78 y.o. Female  MRN: [id number] Visit Date: 08/30/2023  Today's healthcare provider: Aden Agreste, MD   Chief Complaint  Patient presents with   Annual Exam    Diet -  general, healthy Exercise - walking 4 days a week for at least 2 miles Feeling - well Sleeping - good Concerns - took abx for root canal and ended up with yeast infection not sure if it is completely gone and that was a week ago, used monistat from otc as treatment. Knot on palm of left hand X 1 week with some pain when pressing on it and shooting pain on left side of neck and when she breaths deep in shoots, also reports lower left side she is not able to straighten or walk when waking up   Subjective    Natalie Rosales is a 78 y.o. female who presents today for a complete physical exam.   Discussed the use of AI scribe software for clinical note transcription with the patient, who gave verbal consent to proceed.  History of Present Illness   Natalie Rosales is a 78 year old female who presents for an annual physical exam and to discuss a yeast infection, a knot on her left hand, and left-sided neck pain.  She developed a yeast infection after taking antibiotics for a root canal. Monistat provided partial relief, but the infection persists a week after treatment.  A knot on her left hand was noticed last week. It moves with the tendon and is suspected to be a ganglion cyst. She has arthritis in her hands without significant pain.  Left-sided neck pain has been present for a couple of months, described as shooting and exacerbated by deep breathing. Initially intermittent, it has persisted for a couple of weeks. Tenderness is noted in the neck muscles, especially with deep breaths. No shoulder issues are present.  She takes Fosamax , omeprazole , and vitamin D  supplements, with occasional missed doses of Fosamax . She took probiotics during her  antibiotic course. She works as a Interior and spatial designer and walks 6,000 to 10,000 steps daily. She is married with two children, aged two and eight.       Last depression screening scores    08/30/2023    2:34 PM 07/27/2023    4:01 PM 07/22/2022    2:08 PM  PHQ 2/9 Scores  PHQ - 2 Score 0 0 0  PHQ- 9 Score 2 0    Last fall risk screening    08/30/2023    2:35 PM  Fall Risk   Falls in the past year? 0  Number falls in past yr: 0  Injury with Fall? 0  Risk for fall due to : No Fall Risks        Medications: Outpatient Medications Prior to Visit  Medication Sig   alendronate  (FOSAMAX ) 70 MG tablet TAKE 1 TABLET BY MOUTH EVERY 7 (SEVEN) DAYS. TAKE WITH A FULL GLASS OF WATER  ON AN EMPTY STOMACH.   ALPRAZolam  (XANAX ) 0.25 MG tablet TAKE 1 TABLET BY MOUTH AT BEDTIME AS NEEDED FOR ANXIETY.   celecoxib  (CELEBREX ) 200 MG capsule TAKE 1 CAPSULE BY MOUTH EVERY DAY   mometasone  (NASONEX ) 50 MCG/ACT nasal spray Place 2 sprays into the nose daily.   omeprazole  (PRILOSEC) 20 MG capsule TAKE 1 CAPSULE (20 MG TOTAL) BY MOUTH DAILY. AS NEEDED ONLY   aspirin 81 MG chewable tablet Chew by mouth  daily. (Patient not taking: Reported on 08/30/2023)   fluorouracil (EFUDEX) 5 % cream  (Patient not taking: Reported on 08/30/2023)   methocarbamol (ROBAXIN) 500 MG tablet Take 500 mg by mouth every 8 (eight) hours as needed for muscle spasms. (Patient not taking: Reported on 11/09/2022)   No facility-administered medications prior to visit.    Review of Systems    Objective    BP 109/66 (BP Location: Right Arm, Patient Position: Sitting, Cuff Size: Normal)   Pulse 70   Ht 5' 2.5" (1.588 m)   Wt 115 lb 12.8 oz (52.5 kg)   SpO2 100%   BMI 20.84 kg/m    Physical Exam Vitals reviewed.  Constitutional:      General: She is not in acute distress.    Appearance: Normal appearance. She is well-developed. She is not diaphoretic.  HENT:     Head: Normocephalic and atraumatic.     Right Ear: Tympanic membrane, ear  canal and external ear normal.     Left Ear: Tympanic membrane, ear canal and external ear normal.     Nose: Nose normal.     Mouth/Throat:     Mouth: Mucous membranes are moist.     Pharynx: Oropharynx is clear. No oropharyngeal exudate.  Eyes:     General: No scleral icterus.    Conjunctiva/sclera: Conjunctivae normal.     Pupils: Pupils are equal, round, and reactive to light.  Neck:     Thyroid : No thyromegaly.  Cardiovascular:     Rate and Rhythm: Normal rate and regular rhythm.     Pulses: Normal pulses.     Heart sounds: Normal heart sounds. No murmur heard. Pulmonary:     Effort: No respiratory distress.     Breath sounds: Normal breath sounds. No wheezing or rales.  Abdominal:     General: There is no distension.     Palpations: Abdomen is soft.     Tenderness: There is no abdominal tenderness.  Musculoskeletal:        General: No deformity.     Cervical back: Neck supple.     Right lower leg: No edema.     Left lower leg: No edema.  Lymphadenopathy:     Cervical: No cervical adenopathy.  Skin:    General: Skin is warm and dry.     Findings: No rash.  Neurological:     Mental Status: She is alert and oriented to person, place, and time. Mental status is at baseline.     Gait: Gait normal.  Psychiatric:        Mood and Affect: Mood normal.        Behavior: Behavior normal.        Thought Content: Thought content normal.      No results found for any visits on 08/30/23.  Assessment & Plan    Routine Health Maintenance and Physical Exam  Exercise Activities and Dietary recommendations  Goals      Cut out extra servings     DIET - EAT MORE FRUITS AND VEGETABLES     DIET - INCREASE WATER  INTAKE     Recommend to drink at least 6-8 8oz glasses of water  per day.        Immunization History  Administered Date(s) Administered   PFIZER Comirnaty(Gray Top)Covid-19 Tri-Sucrose Vaccine 05/26/2019, 06/16/2019   PFIZER(Purple Top)SARS-COV-2 Vaccination  02/19/2020   Pneumococcal Conjugate-13 11/26/2014   Pneumococcal Polysaccharide-23 01/13/2016   Tdap 03/03/2017    Health Maintenance  Topic Date Due  Zoster Vaccines- Shingrix (1 of 2) Never done   COVID-19 Vaccine (4 - 2024-25 season) 12/27/2022   DEXA SCAN  09/17/2023   MAMMOGRAM  10/06/2023   INFLUENZA VACCINE  11/26/2023   Medicare Annual Wellness (AWV)  07/26/2024   DTaP/Tdap/Td (2 - Td or Tdap) 03/04/2027   Pneumonia Vaccine 62+ Years old  Completed   Hepatitis C Screening  Completed   HPV VACCINES  Aged Out   Meningococcal B Vaccine  Aged Out   Colonoscopy  Discontinued    Discussed health benefits of physical activity, and encouraged her to engage in regular exercise appropriate for her age and condition.  Problem List Items Addressed This Visit       Endocrine   Subclinical hyperthyroidism   Relevant Orders   TSH + free T4     Other   Combined fat and carbohydrate induced hyperlipemia   Relevant Orders   Comprehensive metabolic panel with GFR   Lipid panel   Ganglion cyst of finger of left hand   Prediabetes   Relevant Orders   Hemoglobin A1c   Avitaminosis D   Relevant Orders   VITAMIN D  25 Hydroxy (Vit-D Deficiency, Fractures)   Other Visit Diagnoses       Encounter for annual physical exam    -  Primary     Breast cancer screening by mammogram       Relevant Orders   MM 3D SCREENING MAMMOGRAM BILATERAL BREAST     Vaginal yeast infection       Relevant Medications   fluconazole (DIFLUCAN) 150 MG tablet     Trapezius muscle spasm               Muscle pain in neck Left-sided neck pain for a couple of months, exacerbated by deep breathing. Likely muscular in origin, possibly due to tension or posture. No signs of serious underlying conditions. - Apply heat to the neck - Consider massage therapy for relaxation  Ganglion cyst of left hand Ganglion cyst on left hand, appeared last week. Associated with arthritis. Discussed natural course and  treatment options, including observation, drainage, or surgical removal if necessary. - Apply ice to the cyst - Use Voltaren gel - Monitor cyst for changes in size or pain - Consider referral for drainage if cyst does not improve  Osteoarthritis of hands Chronic osteoarthritis in hands with some deformity, particularly in thumbs. No significant pain reported. Discussed management options and the use of topical treatments like Voltaren gel, which is effective for small joints without gastrointestinal side effects. - Use Voltaren gel on affected joints  Yeast infection Yeast infection following antibiotic use for a root canal. Monistat provided partial relief. Discussed the use of Diflucan for complete resolution, which is a single-dose oral treatment. - Prescribe Diflucan (fluconazole)  Wellness Visit Routine wellness visit. Discussed the importance of regular screenings and vaccinations. Reviewed current medications and health status. Recommended annual mammograms despite some guidelines suggesting biennial screening, especially given past biopsy and breast cancer concerns. - Order mammogram - Order labs for cholesterol and vitamin D  - Review bone density scan results when available        Return in about 1 year (around 08/29/2024) for CPE.     Aden Agreste, MD  [facility] [phone] (phone) [phone] (fax)  [facility]

## 2023-08-31 ENCOUNTER — Encounter: Payer: Self-pay | Admitting: Family Medicine

## 2023-08-31 DIAGNOSIS — E789 Disorder of lipoprotein metabolism, unspecified: Secondary | ICD-10-CM

## 2023-08-31 LAB — COMPREHENSIVE METABOLIC PANEL WITH GFR
ALT: 10 IU/L (ref 0–32)
AST: 23 IU/L (ref 0–40)
Albumin: 4.5 g/dL (ref 3.8–4.8)
Alkaline Phosphatase: 58 IU/L (ref 44–121)
BUN/Creatinine Ratio: 13 (ref 12–28)
BUN: 13 mg/dL (ref 8–27)
Bilirubin Total: 0.6 mg/dL (ref 0.0–1.2)
CO2: 22 mmol/L (ref 20–29)
Calcium: 9.5 mg/dL (ref 8.7–10.3)
Chloride: 103 mmol/L (ref 96–106)
Creatinine, Ser: 0.98 mg/dL (ref 0.57–1.00)
Globulin, Total: 2.2 g/dL (ref 1.5–4.5)
Glucose: 87 mg/dL (ref 70–99)
Potassium: 4.6 mmol/L (ref 3.5–5.2)
Sodium: 139 mmol/L (ref 134–144)
Total Protein: 6.7 g/dL (ref 6.0–8.5)
eGFR: 59 mL/min/{1.73_m2} — ABNORMAL LOW (ref >59)

## 2023-08-31 LAB — LIPID PANEL
Chol/HDL Ratio: 3.4 ratio (ref 0.0–4.4)
Cholesterol, Total: 215 mg/dL — ABNORMAL HIGH (ref 100–199)
HDL: 64 mg/dL (ref >39)
LDL Chol Calc (NIH): 137 mg/dL — ABNORMAL HIGH (ref 0–99)
Triglycerides: 80 mg/dL (ref 0–149)
VLDL Cholesterol Cal: 14 mg/dL (ref 5–40)

## 2023-08-31 LAB — TSH+FREE T4
Free T4: 1.23 ng/dL (ref 0.82–1.77)
TSH: 1.25 u[IU]/mL (ref 0.450–4.500)

## 2023-08-31 LAB — HEMOGLOBIN A1C
Est. average glucose Bld gHb Est-mCnc: 117 mg/dL
Hgb A1c MFr Bld: 5.7 % — ABNORMAL HIGH (ref 4.8–5.6)

## 2023-08-31 LAB — VITAMIN D 25 HYDROXY (VIT D DEFICIENCY, FRACTURES): Vit D, 25-Hydroxy: 39.6 ng/mL (ref 30.0–100.0)

## 2023-08-31 MED ORDER — EZETIMIBE 10 MG PO TABS: 10.0000 mg | ORAL_TABLET | Freq: Every day | ORAL | 1 refills | Status: DC

## 2023-09-22 ENCOUNTER — Ambulatory Visit
Admission: RE | Admit: 2023-09-22 | Discharge: 2023-09-22 | Disposition: A | Discharge status: Home / Self Care | Source: Ambulatory Visit | Attending: Family Medicine | Admitting: Family Medicine

## 2023-09-22 DIAGNOSIS — M810 Age-related osteoporosis without current pathological fracture: Secondary | ICD-10-CM | POA: Diagnosis not present

## 2023-09-22 DIAGNOSIS — Z780 Asymptomatic menopausal state: Secondary | ICD-10-CM | POA: Diagnosis not present

## 2023-09-23 ENCOUNTER — Ambulatory Visit: Payer: Self-pay | Admitting: Family Medicine

## 2023-10-05 DIAGNOSIS — Z961 Presence of intraocular lens: Secondary | ICD-10-CM | POA: Diagnosis not present

## 2023-10-11 ENCOUNTER — Ambulatory Visit

## 2023-10-12 ENCOUNTER — Ambulatory Visit
Admission: RE | Admit: 2023-10-12 | Discharge: 2023-10-12 | Disposition: A | Discharge status: Home / Self Care | Source: Ambulatory Visit | Attending: Family Medicine | Admitting: Family Medicine

## 2023-10-12 DIAGNOSIS — Z1231 Encounter for screening mammogram for malignant neoplasm of breast: Secondary | ICD-10-CM | POA: Insufficient documentation

## 2023-10-14 ENCOUNTER — Ambulatory Visit: Payer: Self-pay | Admitting: Family Medicine

## 2023-10-25 ENCOUNTER — Other Ambulatory Visit: Payer: Self-pay | Admitting: Family Medicine

## 2023-10-26 NOTE — Telephone Encounter (Signed)
 Requested Prescriptions  Pending Prescriptions Disp Refills   omeprazole  (PRILOSEC) 20 MG capsule [Pharmacy Med Name: OMEPRAZOLE  DR 20 MG CAPSULE] 90 capsule 3    Sig: TAKE 1 CAPSULE (20 MG TOTAL) BY MOUTH DAILY. AS NEEDED ONLY     Gastroenterology: Proton Pump Inhibitors Passed - 10/26/2023  2:55 PM      Passed - Valid encounter within last 12 months    Recent Outpatient Visits           1 month ago Encounter for annual physical exam   [place] [facility] [place], Jon HERO, MD       Future Appointments             In 10 months Bacigalupo, Jon HERO, MD [facility], [facility]

## 2023-10-28 ENCOUNTER — Other Ambulatory Visit: Payer: Self-pay | Admitting: Family Medicine

## 2023-10-28 DIAGNOSIS — M545 Low back pain, unspecified: Secondary | ICD-10-CM

## 2023-10-28 NOTE — Telephone Encounter (Signed)
 Requested Prescriptions  Pending Prescriptions Disp Refills   celecoxib  (CELEBREX ) 200 MG capsule [Pharmacy Med Name: CELECOXIB  200 MG CAPSULE] 90 capsule 0    Sig: TAKE 1 CAPSULE BY MOUTH EVERY DAY     Analgesics:  COX2 Inhibitors Failed - 10/28/2023  4:44 PM      Failed - Manual Review: Labs are only required if the patient has taken medication for more than 8 weeks.      Passed - HGB in normal range and within 360 days    Hemoglobin  Date Value Ref Range Status  11/09/2022 12.4 11.1 - 15.9 g/dL Final         Passed - Cr in normal range and within 360 days    Creatinine  Date Value Ref Range Status  05/29/2014 0.96 0.60 - 1.30 mg/dL Final   Creatinine, Ser  Date Value Ref Range Status  08/30/2023 0.98 0.57 - 1.00 mg/dL Final         Passed - HCT in normal range and within 360 days    Hematocrit  Date Value Ref Range Status  11/09/2022 37.7 34.0 - 46.6 % Final         Passed - AST in normal range and within 360 days    AST  Date Value Ref Range Status  08/30/2023 23 0 - 40 IU/L Final         Passed - ALT in normal range and within 360 days    ALT  Date Value Ref Range Status  08/30/2023 10 0 - 32 IU/L Final         Passed - eGFR is 30 or above and within 360 days    EGFR (African American)  Date Value Ref Range Status  05/29/2014 >60 >25mL/min Final   GFR calc Af Amer  Date Value Ref Range Status  07/05/2019 85 >59 mL/min/1.73 Final   EGFR (Non-African Amer.)  Date Value Ref Range Status  05/29/2014 >60 >52mL/min Final    Comment:    eGFR values <36mL/min/1.73 m2 may be an indication of chronic kidney disease (CKD). Calculated eGFR, using the MRDR Study equation, is useful in  patients with stable renal function. The eGFR calculation will not be reliable in acutely ill patients when serum creatinine is changing rapidly. It is not useful in patients on dialysis. The eGFR calculation may not be applicable to patients at the low and high extremes of body  sizes, pregnant women, and vegetarians.    GFR calc non Af Amer  Date Value Ref Range Status  07/05/2019 74 >59 mL/min/1.73 Final   eGFR  Date Value Ref Range Status  08/30/2023 59 (L) >59 mL/min/1.73 Final         Passed - Patient is not pregnant      Passed - Valid encounter within last 12 months    Recent Outpatient Visits           1 month ago Encounter for annual physical exam   [place] [facility] [place], Jon HERO, MD       Future Appointments             In 10 months Bacigalupo, Jon HERO, MD [facility], [facility]

## 2023-11-17 DIAGNOSIS — Z950 Presence of cardiac pacemaker: Secondary | ICD-10-CM | POA: Diagnosis not present

## 2023-12-01 ENCOUNTER — Other Ambulatory Visit: Payer: Self-pay | Admitting: Family Medicine

## 2023-12-01 DIAGNOSIS — E789 Disorder of lipoprotein metabolism, unspecified: Secondary | ICD-10-CM

## 2023-12-20 DIAGNOSIS — Z859 Personal history of malignant neoplasm, unspecified: Secondary | ICD-10-CM | POA: Diagnosis not present

## 2023-12-20 DIAGNOSIS — Z872 Personal history of diseases of the skin and subcutaneous tissue: Secondary | ICD-10-CM | POA: Diagnosis not present

## 2023-12-20 DIAGNOSIS — Z86018 Personal history of other benign neoplasm: Secondary | ICD-10-CM | POA: Diagnosis not present

## 2023-12-20 DIAGNOSIS — L578 Other skin changes due to chronic exposure to nonionizing radiation: Secondary | ICD-10-CM | POA: Diagnosis not present

## 2023-12-20 DIAGNOSIS — L853 Xerosis cutis: Secondary | ICD-10-CM | POA: Diagnosis not present

## 2023-12-20 DIAGNOSIS — Z85828 Personal history of other malignant neoplasm of skin: Secondary | ICD-10-CM | POA: Diagnosis not present

## 2024-01-25 ENCOUNTER — Other Ambulatory Visit: Payer: Self-pay | Admitting: Family Medicine

## 2024-01-25 DIAGNOSIS — M545 Low back pain, unspecified: Secondary | ICD-10-CM

## 2024-01-26 ENCOUNTER — Ambulatory Visit: Payer: Self-pay

## 2024-01-26 NOTE — Telephone Encounter (Signed)
 Unable to reach patient for triage x 3 attempts.

## 2024-01-26 NOTE — Telephone Encounter (Signed)
 Requested Prescriptions  Pending Prescriptions Disp Refills   celecoxib  (CELEBREX ) 200 MG capsule [Pharmacy Med Name: CELECOXIB  200 MG CAPSULE] 90 capsule 0    Sig: TAKE 1 CAPSULE BY MOUTH EVERY DAY     Analgesics:  COX2 Inhibitors Failed - 01/26/2024  1:46 PM      Failed - Manual Review: Labs are only required if the patient has taken medication for more than 8 weeks.      Failed - HGB in normal range and within 360 days    Hemoglobin  Date Value Ref Range Status  11/09/2022 12.4 11.1 - 15.9 g/dL Final         Failed - HCT in normal range and within 360 days    Hematocrit  Date Value Ref Range Status  11/09/2022 37.7 34.0 - 46.6 % Final         Passed - Cr in normal range and within 360 days    Creatinine  Date Value Ref Range Status  05/29/2014 0.96 0.60 - 1.30 mg/dL Final   Creatinine, Ser  Date Value Ref Range Status  08/30/2023 0.98 0.57 - 1.00 mg/dL Final         Passed - AST in normal range and within 360 days    AST  Date Value Ref Range Status  08/30/2023 23 0 - 40 IU/L Final         Passed - ALT in normal range and within 360 days    ALT  Date Value Ref Range Status  08/30/2023 10 0 - 32 IU/L Final         Passed - eGFR is 30 or above and within 360 days    EGFR (African American)  Date Value Ref Range Status  05/29/2014 >60 >10mL/min Final   GFR calc Af Amer  Date Value Ref Range Status  07/05/2019 85 >59 mL/min/1.73 Final   EGFR (Non-African Amer.)  Date Value Ref Range Status  05/29/2014 >60 >5mL/min Final    Comment:    eGFR values <45mL/min/1.73 m2 may be an indication of chronic kidney disease (CKD). Calculated eGFR, using the MRDR Study equation, is useful in  patients with stable renal function. The eGFR calculation will not be reliable in acutely ill patients when serum creatinine is changing rapidly. It is not useful in patients on dialysis. The eGFR calculation may not be applicable to patients at the low and high extremes of body  sizes, pregnant women, and vegetarians.    GFR calc non Af Amer  Date Value Ref Range Status  07/05/2019 74 >59 mL/min/1.73 Final   eGFR  Date Value Ref Range Status  08/30/2023 59 (L) >59 mL/min/1.73 Final         Passed - Patient is not pregnant      Passed - Valid encounter within last 12 months    Recent Outpatient Visits           4 months ago Encounter for annual physical exam   [place] [facility] [place], Jon HERO, MD       Future Appointments             In 7 months Bacigalupo, Jon HERO, MD [facility], [place]

## 2024-01-26 NOTE — Telephone Encounter (Signed)
 Copied from CRM #[id number]. Topic: Clinical - Red Word Triage >> Jan 26, 2024  8:52 AM Natalie Rosales wrote: Red Word that prompted transfer to Nurse Triage: Worsening pain, hurts more in the morning and when she is trying to sleep. Constant pain. Left side wrapping to the back.

## 2024-01-26 NOTE — Telephone Encounter (Signed)
 Appointment made for 01/27/2024 with Janna Ostwalt PA-C at 4pm  FYI Only or Action Required?: FYI only for provider.  Patient was last seen in primary care on 08/30/2023 by Myrla Jon HERO, MD.  Called Nurse Triage reporting Back Pain and Abdominal Pain.  Symptoms began over a year ago.  Interventions attempted: OTC medications: tylenol , ibuprofen and Rest, hydration, or home remedies.  Symptoms are: gradually worsening.  Triage Disposition: See PCP When Office is Open (Within 3 Days)  Patient/caregiver understands and will follow disposition?:  Reason for Disposition  [1] MODERATE back pain (e.g., interferes with normal activities) AND [2] present > 3 days  Abdominal pain is a chronic symptom (recurrent or ongoing AND present > 4 weeks)  Answer Assessment - Initial Assessment Questions Patient does have staples around this area from a hysterectomy back in about 30 years ago  Right hip replacement in the past Patient spoke to her PCP at one point and they discussed an ultrasound possibly around May. Patient states that she had this side checked out the year before as well near her hipbone   1. ONSET: When did the pain begin? (e.g., minutes, hours, days)      Soreness for a year now off and on Worse in the past 6 months Last 1-2 weeks worse Hard to move in the mornings  2. LOCATION: Where does it hurt? (upper, mid or lower back)     Left lower back and left lower front of abd/pelvic area 3. SEVERITY: How bad is the pain?  (e.g., Scale 1-10; mild, moderate, or severe)     tenderness 4. PATTERN: Is the pain constant? (e.g., yes, no; constant, intermittent)      Seems always sore and worse in the mornings 5. RADIATION: Does the pain shoot into your legs or somewhere else?     Around to left lower front part 6. CAUSE:  What do you think is causing the back pain?      unsure 7. BACK OVERUSE:  Any recent lifting of heavy objects, strenuous work or exercise?      denies 8. MEDICINES: What have you taken so far for the pain? (e.g., nothing, acetaminophen , NSAIDS)      Tylenol , Ibuprofen, Muscle relaxer---didn't help, stretching,  9. NEUROLOGIC SYMPTOMS: Do you have any weakness, numbness, or problems with bowel/bladder control?     denies 10. OTHER SYMPTOMS: Do you have any other symptoms? (e.g., fever, abdomen pain, burning with urination, blood in urine)  Answer Assessment - Initial Assessment Questions 1. LOCATION: Where does it hurt?      Lower left part of abdominopelvic region 2. RADIATION: Does the pain shoot anywhere else? (e.g., chest, back)     Around left hip area to left lower side of back 3. ONSET: When did the pain begin? (e.g., minutes, hours or days ago)      Over a year ago 4. SUDDEN: Gradual or sudden onset?     Been going on for over a year 5. PATTERN Does the pain come and go, or is it constant?     Comes and goes 6. SEVERITY: How bad is the pain?  (e.g., Scale 1-10; mild, moderate, or severe)     1 out of 10   off and on  for over a year 7. RECURRENT SYMPTOM: Have you ever had this type of stomach pain before? If Yes, ask: When was the last time? and What happened that time?      Patient states she spoke with  her pcp about this a year or so ago but they just didn't do anything further with it 8. CAUSE: What do you think is causing the stomach pain? (e.g., gallstones, recent abdominal surgery)     unsure 9. RELIEVING/AGGRAVATING FACTORS: What makes it better or worse? (e.g., antacids, bending or twisting motion, bowel movement)      Getting up and moving around 10. OTHER SYMPTOMS: Do you have any other symptoms? (e.g., back pain, diarrhea, fever, urination pain, vomiting)       Left lower back pain  Protocols used: Abdominal Pain - Female-A-AH, Back Pain-A-AH

## 2024-01-27 ENCOUNTER — Encounter: Payer: Self-pay | Admitting: Physician Assistant

## 2024-01-27 ENCOUNTER — Ambulatory Visit (INDEPENDENT_AMBULATORY_CARE_PROVIDER_SITE_OTHER): Admitting: Physician Assistant

## 2024-01-27 VITALS — BP 123/51 | HR 79 | Resp 14 | Ht 62.5 in | Wt 113.1 lb

## 2024-01-27 DIAGNOSIS — R1084 Generalized abdominal pain: Secondary | ICD-10-CM | POA: Diagnosis not present

## 2024-01-27 DIAGNOSIS — R300 Dysuria: Secondary | ICD-10-CM | POA: Diagnosis not present

## 2024-01-27 DIAGNOSIS — K5909 Other constipation: Secondary | ICD-10-CM | POA: Diagnosis not present

## 2024-01-27 DIAGNOSIS — M47894 Other spondylosis, thoracic region: Secondary | ICD-10-CM | POA: Diagnosis not present

## 2024-01-27 LAB — POCT URINALYSIS DIPSTICK
Bilirubin, UA: POSITIVE
Glucose, UA: NEGATIVE
Ketones, UA: NEGATIVE
Leukocytes, UA: NEGATIVE
Nitrite, UA: NEGATIVE
Protein, UA: POSITIVE — AB
Spec Grav, UA: >=1.03 — AB (ref 1.010–1.025)
Urobilinogen, UA: 0.2 U/dL
pH, UA: 6 (ref 5.0–8.0)

## 2024-01-27 NOTE — Progress Notes (Signed)
 Established patient visit  Patient: Natalie Rosales   DOB: 1945-09-29   78 y.o. Female  MRN: [id number] Visit Date: 01/27/2024  Today's healthcare provider: Jolynn Spencer, PA-C   Chief Complaint  Patient presents with   Back Pain    L lower back pain that radiates to LLQ. Onset 2 years started with soreness on L hip that subsided, worsen last 6 months spoke with Dr. KATHEE in May regarding the same problem.    Subjective     HPI     Back Pain    Additional comments: L lower back pain that radiates to LLQ. Onset 2 years started with soreness on L hip that subsided, worsen last 6 months spoke with Dr. KATHEE in May regarding the same problem.       Last edited by Wilfred Hargis RAMAN, CMA on 01/27/2024  4:01 PM.       Discussed the use of AI scribe software for clinical note transcription with the patient, who gave verbal consent to proceed.  History of Present Illness Natalie Rosales is a 78 year old female with a pacemaker who presents with severe left lower back pain.  She experiences severe left lower back pain, described as a sensation of something internal causing the pain. The pain has persisted and worsened over six months, reaching an intensity of eight out of ten upon waking, easing after 30 minutes to an hour. It occasionally disrupts her sleep and is not typically associated with movement, though she feels discomfort while sitting.  Her medical history includes a radical hysterectomy and breast cancer, with a pacemaker in place. She speculates that staples from her hysterectomy might contribute to her pain. Arthritis was previously identified in her thoracic spine through imaging studies.  She has a slow urinary stream developing over the past few months, taking two to three minutes to void, with no burning sensation, fever, or vaginal discharge. Previous workup noted traces of blood and a little protein in her urine.  Past imaging includes a CT scan of the lumbar spine in 2021, which  showed arthritis, and an ultrasound in 2023. A sonogram was suggested but not scheduled.       08/30/2023    2:34 PM 07/27/2023    4:01 PM 07/22/2022    2:08 PM  Depression screen PHQ 2/9  Decreased Interest 0 0 0  Down, Depressed, Hopeless 0 0 0  PHQ - 2 Score 0 0 0  Altered sleeping 0 0   Tired, decreased energy 1 0   Change in appetite 0 0   Feeling bad or failure about yourself  0 0   Trouble concentrating 1 0   Moving slowly or fidgety/restless 0 0   Suicidal thoughts 0 0   PHQ-9 Score 2 0   Difficult doing work/chores Not difficult at all Not difficult at all       08/30/2023    2:35 PM 04/14/2022    8:23 AM 10/03/2018    9:09 AM 03/04/2018    4:34 PM  GAD 7 : Generalized Anxiety Score  Nervous, Anxious, on Edge 1 2 1  0  Control/stop worrying 0 1 1 0  Worry too much - different things 0 1 1 0  Trouble relaxing 0 1 0 0  Restless 0 1 1 0  Easily annoyed or irritable 1 1 1 1   Afraid - awful might happen 1 1 1 1   Total GAD 7 Score 3 8 6 2   Anxiety Difficulty Not difficult  at all Not difficult at all Not difficult at all Not difficult at all    Medications: Outpatient Medications Prior to Visit  Medication Sig   alendronate  (FOSAMAX ) 70 MG tablet TAKE 1 TABLET BY MOUTH EVERY 7 (SEVEN) DAYS. TAKE WITH A FULL GLASS OF WATER  ON AN EMPTY STOMACH.   ALPRAZolam  (XANAX ) 0.25 MG tablet TAKE 1 TABLET BY MOUTH AT BEDTIME AS NEEDED FOR ANXIETY.   celecoxib  (CELEBREX ) 200 MG capsule TAKE 1 CAPSULE BY MOUTH EVERY DAY (Patient taking differently: Take 200 mg by mouth as needed.)   ezetimibe  (ZETIA ) 10 MG tablet TAKE 1 TABLET BY MOUTH EVERY DAY   mometasone  (NASONEX ) 50 MCG/ACT nasal spray Place 2 sprays into the nose daily.   omeprazole  (PRILOSEC) 20 MG capsule TAKE 1 CAPSULE (20 MG TOTAL) BY MOUTH DAILY. AS NEEDED ONLY (Patient not taking: Reported on 01/27/2024)   [DISCONTINUED] aspirin 81 MG chewable tablet Chew by mouth daily. (Patient not taking: Reported on 08/30/2023)   [DISCONTINUED]  fluorouracil (EFUDEX) 5 % cream  (Patient not taking: Reported on 08/30/2023)   [DISCONTINUED] methocarbamol (ROBAXIN) 500 MG tablet Take 500 mg by mouth every 8 (eight) hours as needed for muscle spasms. (Patient not taking: Reported on 11/09/2022)   No facility-administered medications prior to visit.    Review of Systems All negative Except see HPI       Objective    BP (!) 123/51   Pulse 79   Resp 14   Ht 5' 2.5 (1.588 m)   Wt 113 lb 1.6 oz (51.3 kg)   SpO2 100%   BMI 20.36 kg/m     Physical Exam Vitals reviewed.  Constitutional:      General: She is not in acute distress.    Appearance: Normal appearance. She is well-developed. She is not diaphoretic.  HENT:     Head: Normocephalic and atraumatic.  Eyes:     General: No scleral icterus.    Conjunctiva/sclera: Conjunctivae normal.  Neck:     Thyroid : No thyromegaly.  Cardiovascular:     Rate and Rhythm: Normal rate and regular rhythm.     Pulses: Normal pulses.     Heart sounds: Normal heart sounds. No murmur heard. Pulmonary:     Effort: Pulmonary effort is normal. No respiratory distress.     Breath sounds: Normal breath sounds. No wheezing, rhonchi or rales.  Musculoskeletal:     Cervical back: Neck supple.     Right lower leg: No edema.     Left lower leg: No edema.  Lymphadenopathy:     Cervical: No cervical adenopathy.  Skin:    General: Skin is warm and dry.     Findings: No rash.  Neurological:     Mental Status: She is alert and oriented to person, place, and time. Mental status is at baseline.  Psychiatric:        Mood and Affect: Mood normal.        Behavior: Behavior normal.      Results for orders placed or performed in visit on 01/27/24  POCT urinalysis dipstick  Result Value Ref Range   Color, UA yellow    Clarity, UA clear    Glucose, UA Negative Negative   Bilirubin, UA Positive    Ketones, UA Negative    Spec Grav, UA >=1.030 (A) 1.010 - 1.025   Blood, UA Trace    pH, UA 6.0  5.0 - 8.0   Protein, UA Positive (A) Negative   Urobilinogen, UA 0.2  0.2 or 1.0 E.U./dL   Nitrite, UA negative    Leukocytes, UA Negative Negative   Appearance     Odor          Assessment & Plan Left lower back and abdominal pain with hematuria and dysuria Chronic pain with hematuria and dysuria, possibly due to kidney issues, diverticulitis, or surgical adhesions. Previous imaging showed thoracic spine arthritis. Urinalysis indicated blood and protein. Normal vitals - Order urinalysis and urine culture for UTI or kidney infection. - Order CT scan of the abdomen if initial tests are inconclusive. - Consider abdominal ultrasound as a cost-effective alternative. - Advise high fiber and probiotics for bowel management. *Use clear diet and low fiber diet for acute phase of diverticulitis  Constipation and altered bowel habits Chronic  Constipation alternating with diarrhea, family history of Crohn's and diverticulitis. Previous colonoscopy unremarkable/internal hemorrhoids non-bleeding. - Advise high fiber diet and increased water  intake. - Recommend probiotics like yogurt and kefir.  Pt was advised: -Eliminate medications that cause constipation. -Increase fluid intake. -Increase soluble fiber (25-30g/day) in diet. -Encourage regular defecation attempts after eating. -Regular exercise -Enemas if other methods fail- Bulking agents (accompanied by adequate fluids)  Psyllium  (Konsyl, Metamucil, Perdiem Fiber): 1 tbsp (approx 3.5 grams fiber) in 8-oz liquid PO daily up to TID Pt instructions/high-fiber eating plan were provided   Spinal osteoarthritis Spinal osteoarthritis with multilevel involvement. Pain may refer to abdomen. XR thoracic spine from 07/16/20/  CT scan from 10/09/19 showed multilevel degenerative changes.  - Advise heating pads and topical Voltaren gel. - Encourage warm baths, PT, stretching exercise Will follow-up   Dysuria (Primary)  - POCT urinalysis  dipstick - Urinalysis, Routine w reflex microscopic - Urine Culture - CBC with Differential/Platelet - Comprehensive metabolic panel with GFR - Lipase   Orders Placed This Encounter  Procedures   Urine Culture   Urinalysis, Routine w reflex microscopic   CBC with Differential/Platelet   Comprehensive metabolic panel with GFR   Lipase   POCT urinalysis dipstick    No follow-ups on file.   The patient was advised to call back or seek an in-person evaluation if the symptoms worsen or if the condition fails to improve as anticipated.  I discussed the assessment and treatment plan with the patient. The patient was provided an opportunity to ask questions and all were answered. The patient agreed with the plan and demonstrated an understanding of the instructions.  I, Grant Henkes, PA-C have reviewed all documentation for this visit. The documentation on 01/27/2024  for the exam, diagnosis, procedures, and orders are all accurate and complete.  Jolynn Spencer, [facility], MMS [facility] [phone] (phone) [phone] (fax)  [facility]

## 2024-01-28 DIAGNOSIS — R300 Dysuria: Secondary | ICD-10-CM | POA: Diagnosis not present

## 2024-01-29 LAB — URINE CULTURE: Organism ID, Bacteria: NO GROWTH

## 2024-01-29 LAB — SPECIMEN STATUS REPORT

## 2024-01-30 LAB — SPECIMEN STATUS REPORT

## 2024-01-31 ENCOUNTER — Encounter: Payer: Self-pay | Admitting: Physician Assistant

## 2024-02-01 ENCOUNTER — Ambulatory Visit: Payer: Self-pay | Admitting: Physician Assistant

## 2024-02-03 ENCOUNTER — Other Ambulatory Visit: Payer: Self-pay | Admitting: Family Medicine

## 2024-02-03 LAB — URINALYSIS, ROUTINE W REFLEX MICROSCOPIC
Bilirubin, UA: NEGATIVE
Glucose, UA: NEGATIVE
Leukocytes,UA: NEGATIVE
Nitrite, UA: NEGATIVE
RBC, UA: NEGATIVE
Specific Gravity, UA: 1.021 (ref 1.005–1.030)
Urobilinogen, Ur: 0.2 mg/dL (ref 0.2–1.0)
pH, UA: 5 (ref 5.0–7.5)

## 2024-02-03 LAB — COMPREHENSIVE METABOLIC PANEL WITH GFR
ALT: 8 IU/L (ref 0–32)
AST: 20 IU/L (ref 0–40)
Albumin: 4.5 g/dL (ref 3.8–4.8)
Alkaline Phosphatase: 64 IU/L (ref 49–135)
BUN/Creatinine Ratio: 14 (ref 12–28)
BUN: 13 mg/dL (ref 8–27)
Bilirubin Total: 0.4 mg/dL (ref 0.0–1.2)
CO2: 23 mmol/L (ref 20–29)
Calcium: 9.6 mg/dL (ref 8.7–10.3)
Chloride: 102 mmol/L (ref 96–106)
Creatinine, Ser: 0.93 mg/dL (ref 0.57–1.00)
Globulin, Total: 2.1 g/dL (ref 1.5–4.5)
Glucose: 91 mg/dL (ref 70–99)
Potassium: 4.8 mmol/L (ref 3.5–5.2)
Sodium: 140 mmol/L (ref 134–144)
Total Protein: 6.6 g/dL (ref 6.0–8.5)
eGFR: 63 mL/min/1.73 (ref >59)

## 2024-02-03 LAB — CBC WITH DIFFERENTIAL/PLATELET
Basophils Absolute: 0 x10E3/uL (ref 0.0–0.2)
Basos: 1 %
EOS (ABSOLUTE): 0.4 x10E3/uL (ref 0.0–0.4)
Eos: 5 %
Hematocrit: 37.8 % (ref 34.0–46.6)
Hemoglobin: 12.1 g/dL (ref 11.1–15.9)
Immature Grans (Abs): 0 x10E3/uL (ref 0.0–0.1)
Immature Granulocytes: 0 %
Lymphocytes Absolute: 2.4 x10E3/uL (ref 0.7–3.1)
Lymphs: 32 %
MCH: 29.9 pg (ref 26.6–33.0)
MCHC: 32 g/dL (ref 31.5–35.7)
MCV: 93 fL (ref 79–97)
Monocytes Absolute: 0.6 x10E3/uL (ref 0.1–0.9)
Monocytes: 8 %
Neutrophils Absolute: 4 x10E3/uL (ref 1.4–7.0)
Neutrophils: 54 %
Platelets: 280 x10E3/uL (ref 150–450)
RBC: 4.05 x10E6/uL (ref 3.77–5.28)
RDW: 12.7 % (ref 11.7–15.4)
WBC: 7.4 x10E3/uL (ref 3.4–10.8)

## 2024-02-03 LAB — LIPASE: Lipase: 61 U/L (ref 14–85)

## 2024-02-03 LAB — URINE CULTURE

## 2024-02-04 NOTE — Telephone Encounter (Signed)
 Requested Prescriptions  Pending Prescriptions Disp Refills   alendronate  (FOSAMAX ) 70 MG tablet [Pharmacy Med Name: ALENDRONATE  SODIUM 70 MG TAB] 12 tablet 3    Sig: TAKE 1 TABLET BY MOUTH EVERY 7 (SEVEN) DAYS. TAKE WITH A FULL GLASS OF WATER  ON AN EMPTY STOMACH.     Endocrinology:  Bisphosphonates Failed - 02/04/2024  3:15 PM      Failed - Mg Level in normal range and within 360 days    No results found for: MG       Failed - Phosphate in normal range and within 360 days    No results found for: PHOS       Passed - Ca in normal range and within 360 days    Calcium   Date Value Ref Range Status  01/28/2024 9.6 8.7 - 10.3 mg/dL Final   Calcium , Total  Date Value Ref Range Status  05/29/2014 9.1 8.5 - 10.1 mg/dL Final         Passed - Vitamin D  in normal range and within 360 days    Vit D, 25-Hydroxy  Date Value Ref Range Status  08/30/2023 39.6 30.0 - 100.0 ng/mL Final    Comment:    Vitamin D  deficiency has been defined by the Institute of Medicine and an Endocrine Society practice guideline as a level of serum 25-OH vitamin D  less than 20 ng/mL (1,2). The Endocrine Society went on to further define vitamin D  insufficiency as a level between 21 and 29 ng/mL (2). 1. IOM (Institute of Medicine). 2010. Dietary reference    intakes for calcium  and D. [place]: The    [employer]. 2. Holick MF, Binkley Stark, Bischoff-Ferrari HA, et al.    Evaluation, treatment, and prevention of vitamin D     deficiency: an Endocrine Society clinical practice    guideline. JCEM. 2011 Jul; 96(7):1911-30.          Passed - Cr in normal range and within 360 days    Creatinine  Date Value Ref Range Status  05/29/2014 0.96 0.60 - 1.30 mg/dL Final   Creatinine, Ser  Date Value Ref Range Status  01/28/2024 0.93 0.57 - 1.00 mg/dL Final         Passed - eGFR is 30 or above and within 360 days    EGFR (African American)  Date Value Ref Range Status  05/29/2014 >60 >39mL/min  Final   GFR calc Af Amer  Date Value Ref Range Status  07/05/2019 85 >59 mL/min/1.73 Final   EGFR (Non-African Amer.)  Date Value Ref Range Status  05/29/2014 >60 >32mL/min Final    Comment:    eGFR values <79mL/min/1.73 m2 may be an indication of chronic kidney disease (CKD). Calculated eGFR, using the MRDR Study equation, is useful in  patients with stable renal function. The eGFR calculation will not be reliable in acutely ill patients when serum creatinine is changing rapidly. It is not useful in patients on dialysis. The eGFR calculation may not be applicable to patients at the low and high extremes of body sizes, pregnant women, and vegetarians.    GFR calc non Af Amer  Date Value Ref Range Status  07/05/2019 74 >59 mL/min/1.73 Final   eGFR  Date Value Ref Range Status  01/28/2024 63 >59 mL/min/1.73 Final         Passed - Valid encounter within last 12 months    Recent Outpatient Visits           1 week ago Dysuria  [facility] [place], [place], PA-C   5 months ago Encounter for annual physical exam   [facility] [place], Jon HERO, MD       Future Appointments             In 6 months Bacigalupo, Jon HERO, MD [facility], [place]            Passed - Bone Mineral Density or Dexa Scan completed in the last 2 years

## 2024-02-23 ENCOUNTER — Ambulatory Visit: Payer: Self-pay

## 2024-02-23 NOTE — Telephone Encounter (Signed)
 FYI Only or Action Required?: Action required by provider: request for appointment and clinical question for provider.  Patient was last seen in primary care on 01/27/2024 by Ostwalt, Janna, PA-C.  Called Nurse Triage reporting Back Pain.  Symptoms began several years ago.  Interventions attempted: Prescription medications: Celebrex  and Rest, hydration, or home remedies.  Symptoms are: gradually worsening.  Triage Disposition: See PCP When Office is Open (Within 3 Days)  Patient/caregiver understands and will follow disposition?: Yes, but will wait    Copied from CRM [id number]. Topic: Clinical - Red Word Triage >> Feb 23, 2024  3:00 PM Shardie S wrote: [facility] that prompted transfer to Nurse Triage: Back pain-worsening Reason for Disposition  [1] MODERATE back pain (e.g., interferes with normal activities) AND [2] present > 3 days  Answer Assessment - Initial Assessment Questions Additional info: 1) Evaluated by Jolynn Spencer 2 weeks ago re: back pain, she was rx celebrex  and advised if not better to call and they will run test. Patient is calling today to schedule appointment as her back pain has not improved on celebrex , it has now become consistent. See below.  2) Patient would really like to see pcp but no appointments are available with pcp until December, scheduled appointment with alternate provider on patients requested date of 02/28/24. Added to wait list.  3) patient is wondering if pcp can send a new prescription for pain relief?   1. ONSET: When did the pain begin? (e.g., minutes, hours, days)     2 years ago, worse a few weeks ago, now constant X 2 days.  2. LOCATION: Where does it hurt? (upper, mid or lower back)     Mid back-constant, occasionally pain at bra line 3. SEVERITY: How bad is the pain?  (e.g., Scale 1-10; mild, moderate, or severe)     Moderate but worsening 4. PATTERN: Is the pain constant? (e.g., yes, no; constant, intermittent)      Constant  X 2 days 5. RADIATION: Does the pain shoot into your legs or somewhere else?     denies 6. CAUSE:  What do you think is causing the back pain?      Unsure-starting work up. Maybe arthritis 7. BACK OVERUSE:  Any recent lifting of heavy objects, strenuous work or exercise?     No  8. MEDICINES: What have you taken so far for the pain? (e.g., nothing, acetaminophen , NSAIDS)     Celebrex  9. NEUROLOGIC SYMPTOMS: Do you have any weakness, numbness, or problems with bowel/bladder control?     denies 10. OTHER SYMPTOMS: Do you have any other symptoms? (e.g., fever, abdomen pain, burning with urination, blood in urine)     Denies  Protocols used: Back Pain-A-AH

## 2024-02-24 ENCOUNTER — Ambulatory Visit
Admission: RE | Admit: 2024-02-24 | Discharge: 2024-02-24 | Disposition: A | Discharge status: Home / Self Care | Source: Ambulatory Visit | Attending: Family Medicine | Admitting: Family Medicine

## 2024-02-24 ENCOUNTER — Encounter: Payer: Self-pay | Admitting: Family Medicine

## 2024-02-24 ENCOUNTER — Ambulatory Visit
Admission: RE | Admit: 2024-02-24 | Discharge: 2024-02-24 | Disposition: A | Discharge status: Home / Self Care | Attending: Family Medicine | Admitting: Family Medicine

## 2024-02-24 ENCOUNTER — Ambulatory Visit: Admitting: Family Medicine

## 2024-02-24 VITALS — BP 113/59 | HR 72 | Ht 62.5 in | Wt 114.2 lb

## 2024-02-24 DIAGNOSIS — M545 Low back pain, unspecified: Secondary | ICD-10-CM | POA: Diagnosis not present

## 2024-02-24 DIAGNOSIS — G8929 Other chronic pain: Secondary | ICD-10-CM

## 2024-02-24 DIAGNOSIS — M48061 Spinal stenosis, lumbar region without neurogenic claudication: Secondary | ICD-10-CM | POA: Diagnosis not present

## 2024-02-24 DIAGNOSIS — M47816 Spondylosis without myelopathy or radiculopathy, lumbar region: Secondary | ICD-10-CM | POA: Diagnosis not present

## 2024-02-24 DIAGNOSIS — M5126 Other intervertebral disc displacement, lumbar region: Secondary | ICD-10-CM | POA: Diagnosis not present

## 2024-02-24 DIAGNOSIS — I700 Atherosclerosis of aorta: Secondary | ICD-10-CM | POA: Diagnosis not present

## 2024-02-24 DIAGNOSIS — M4317 Spondylolisthesis, lumbosacral region: Secondary | ICD-10-CM | POA: Diagnosis not present

## 2024-02-24 DIAGNOSIS — Z96641 Presence of right artificial hip joint: Secondary | ICD-10-CM | POA: Diagnosis not present

## 2024-02-24 MED ORDER — PREDNISONE 10 MG PO TABS: ORAL_TABLET | ORAL | 0 refills | Status: AC

## 2024-02-24 NOTE — Telephone Encounter (Signed)
 Scheduled for appt with me today - will eval and Rx as appropriate then.

## 2024-02-24 NOTE — Progress Notes (Signed)
 Acute visit   Patient: Natalie Rosales   DOB: 1945/07/01   78 y.o. Female  MRN: [id number] PCP: Myrla Jon HERO, MD   Chief Complaint  Patient presents with   Acute Visit    back pain/Rn Triage yesterday Patient reported evaluated by Jolynn Spencer 2 weeks ago re: back pain, she was rx celebrex  and advised if not better to call and they will run test. Patient is calling today to schedule appointment as her back pain has not improved on celebrex , it has now become consistent.    Back Pain    Patient reports pain began 2 years ago, worse a few weeks ago, now constant X 2 days. Located Mid back-constant, occasionally pain at bra line with moderate but worsening pain that has been constant for 3 days now. Denies radiation. Unsure of cause thinks it may be arthritis. Using celebrex , rest, hydration, or home remedies for treatment    Subjective    Discussed the use of AI scribe software for clinical note transcription with the patient, who gave verbal consent to proceed.  History of Present Illness   Natalie Rosales is a 78 year old female who presents with persistent left-sided lower back pain.  She experienced a severe episode of left-sided lower back pain yesterday, described as a 'really bad spell.' The pain is persistent, although less severe today, and is located in the left lower back. Movement exacerbates the pain, making it difficult to move in certain directions.  She has been taking Celebrex  daily for the past week and a half due to increased pain, but it did not alleviate the pain yesterday. Previously, Celebrex  was taken less frequently and helped with stiffness and pain after moving around for about thirty minutes in the morning. Recently, the pain has become more constant and 'angry,' not subsiding as it used to.  No radiation of pain down the leg, weakness, or numbness in the legs. She feels tired all the time, attributing it to the persistent pain. Recent blood  work, including a urine test, was normal, conducted due to previous concerns about kidney issues.  She mentions doing yard work and using a treadmill daily for thirty minutes. She expresses frustration with her limitations due to age and pain, noting that she used to be able to do more physical activities like painting her kitchen.        Review of Systems  Objective    BP (!) 113/59 (BP Location: Right Arm, Patient Position: Sitting, Cuff Size: Normal)   Pulse 72   Ht 5' 2.5 (1.588 m)   Wt 114 lb 3.2 oz (51.8 kg)   SpO2 99%   BMI 20.55 kg/m  Physical Exam Vitals reviewed.  Constitutional:      General: She is not in acute distress.    Appearance: She is well-developed.  HENT:     Head: Normocephalic and atraumatic.  Eyes:     General: No scleral icterus.    Conjunctiva/sclera: Conjunctivae normal.  Cardiovascular:     Rate and Rhythm: Normal rate and regular rhythm.  Pulmonary:     Effort: Pulmonary effort is normal. No respiratory distress.  Musculoskeletal:     Comments: Mild TTP over L SI joint, normal strength and sensation in LE  Skin:    General: Skin is warm and dry.     Findings: No rash.  Neurological:     Mental Status: She is alert and oriented to person, place, and time.  Psychiatric:        Behavior: Behavior normal.       No results found for any visits on 02/24/24.  Assessment & Plan     Problem List Items Addressed This Visit       Other   Low back pain - Primary   Relevant Medications   predniSONE  (DELTASONE ) 10 MG tablet   Other Relevant Orders   DG Lumbar Spine Complete        Left sacroiliac joint pain with suspected arthritis Chronic left sacroiliac joint pain, likely due to arthritis, with recent exacerbation. Pain is persistent and severe, not relieved by Celebrex . No radiation of pain down the leg, no weakness or numbness. Pain is exacerbated by movement and presents with morning stiffness, consistent with arthritis. Recent yard  work may have contributed to inflammation. - Order x-ray of the lumbar spine to assess arthritis severity. - Prescribe prednisone  taper to reduce inflammation: 6 pills on day 1, then decrease by one pill each day for a week. - Advise against taking Celebrex  or ibuprofen while on prednisone ; Tylenol  is permitted. - Recommend taking prednisone  in the morning with food to avoid sleep disturbances. - Encourage gentle stretching exercises once inflammation subsides. - Discuss protein intake to support energy levels and muscle function.       Meds ordered this encounter  Medications   predniSONE  (DELTASONE ) 10 MG tablet    Sig: Take 60mg  PO daily x1d, then 50mg  daily x1d, then 40mg  daily x1d, then 30mg  daily x1d, then 20mg  daily x1d, then 10mg  daily x1d, then stop    Dispense:  21 tablet    Refill:  0     Return if symptoms worsen or fail to improve.      Jon Eva, MD  [facility] [phone] (phone) [phone] (fax)  [facility]

## 2024-02-24 NOTE — Progress Notes (Deleted)
 Back pain-worsening Reason for Disposition  [1] MODERATE back pain (e.g., interferes with normal activities) AND [2] present > 3 days  Answer Assessment - Initial Assessment Questions Additional info: 1)  See below.  2) Patient would really like to see pcp but no appointments are available with pcp until December, scheduled appointment with alternate provider on patients requested date of 02/28/24. Added to wait list.  3) patient is wondering if pcp can send a new prescription for pain relief?   1. ONSET: When did the pain begin? (e.g., minutes, hours, days)     2 years ago, worse a few weeks ago, now constant X 2 days.  2. LOCATION: Where does it hurt? (upper, mid or lower back)     Mid back-constant, occasionally pain at bra line 3. SEVERITY: How bad is the pain?  (e.g., Scale 1-10; mild, moderate, or severe)     Moderate but worsening 4. PATTERN: Is the pain constant? (e.g., yes, no; constant, intermittent)      Constant X 2 days 5. RADIATION: Does the pain shoot into your legs or somewhere else?     denies 6. CAUSE:  What do you think is causing the back pain?      Unsure-starting work up. Maybe arthritis 7. BACK OVERUSE:  Any recent lifting of heavy objects, strenuous work or exercise?     No  8. MEDICINES: What have you taken so far for the pain? (e.g., nothing, acetaminophen , NSAIDS)     Celebrex  9. NEUROLOGIC SYMPTOMS: Do you have any weakness, numbness, or problems with bowel/bladder control?     denies 10. OTHER SYMPTOMS: Do you have any other symptoms? (e.g., fever, abdomen pain, burning with urination, blood in urine)     Denies

## 2024-02-28 ENCOUNTER — Ambulatory Visit

## 2024-02-28 ENCOUNTER — Ambulatory Visit: Payer: Self-pay | Admitting: Family Medicine

## 2024-03-04 ENCOUNTER — Other Ambulatory Visit: Payer: Self-pay | Admitting: Family Medicine

## 2024-03-04 NOTE — Telephone Encounter (Signed)
 Requested medication (s) are due for refill today: Yes  Requested medication (s) are on the active medication list: Yes  Last refill:  02/24/24  Future visit scheduled: Yes  Notes to clinic:  Unable to refill per protocol, cannot delegate.      Requested Prescriptions  Pending Prescriptions Disp Refills   predniSONE  (DELTASONE ) 10 MG tablet [Pharmacy Med Name: PREDNISONE  10 MG TABLET] 21 tablet 0    Sig: TAKE 60MG  DAILY X1D, THEN 50MG  DAILY X1D, THEN 40MG  DAILY X1D, THEN 30MG  DAILY X1D, THEN 20MG  DAILY X1D, THEN 10MG  DAILY X1D, THEN STOP     Not Delegated - Endocrinology:  Oral Corticosteroids Failed - 03/04/2024 10:58 AM      Failed - This refill cannot be delegated      Failed - Manual Review: Eye exam for IOP if prolonged treatment      Failed - Valid encounter within last 6 months    Recent Outpatient Visits           1 week ago Chronic left-sided low back pain without sciatica   [facility] [place], Jon HERO, MD   1 month ago Dysuria   [facility] [place], [place], PA-C   6 months ago Encounter for annual physical exam   [facility] [place], Jon HERO, MD       Future Appointments             In 6 months Bacigalupo, Jon HERO, MD [facility], [place]            Passed - Glucose (serum) in normal range and within 180 days    Glucose  Date Value Ref Range Status  01/28/2024 91 70 - 99 mg/dL Final  97/97/7983 97 65 - 99 mg/dL Final         Passed - K in normal range and within 180 days    Potassium  Date Value Ref Range Status  01/28/2024 4.8 3.5 - 5.2 mmol/L Final  05/29/2014 4.2 3.5 - 5.1 mmol/L Final         Passed - Na in normal range and within 180 days    Sodium  Date Value Ref Range Status  01/28/2024 140 134 - 144 mmol/L Final  05/29/2014 140 136 - 145 mmol/L Final         Passed - Last BP in normal range    BP Readings from  Last 1 Encounters:  02/24/24 (!) 113/59         Passed - Bone Mineral Density or Dexa Scan completed in the last 2 years

## 2024-03-08 ENCOUNTER — Telehealth: Payer: Self-pay

## 2024-03-08 NOTE — Telephone Encounter (Signed)
 Copied from CRM [id number]. Topic: Clinical - Medication Question >> Mar 08, 2024  7:46 AM Natalie Rosales wrote: Reason for CRM: Patient called in wanting to have  PREDNISONE  10 MG TABLET refilled, advised of refusal. She would like to know why and if it can be sent in again, due to medication helping out a lot with arthritis.

## 2024-03-09 NOTE — Telephone Encounter (Signed)
 We did a short taper of prednisone  for her back pain. This is not a long-term medication, so we do not refill it.

## 2024-03-10 NOTE — Telephone Encounter (Signed)
 Pt advised. Verbalized understanding.

## 2024-08-01 ENCOUNTER — Ambulatory Visit

## 2024-08-31 ENCOUNTER — Encounter: Admitting: Family Medicine
